# Patient Record
Sex: Female | Born: 1940 | Race: White | Hispanic: No | Marital: Married | Smoking: Never smoker
Health system: Southern US, Community
[De-identification: ages and names within clinical notes are randomized; demographics above are authoritative.]

## PROBLEM LIST (undated history)

## (undated) DIAGNOSIS — F329 Major depressive disorder, single episode, unspecified: Secondary | ICD-10-CM

## (undated) DIAGNOSIS — F32A Depression, unspecified: Secondary | ICD-10-CM

## (undated) DIAGNOSIS — E669 Obesity, unspecified: Secondary | ICD-10-CM

## (undated) DIAGNOSIS — N200 Calculus of kidney: Secondary | ICD-10-CM

## (undated) DIAGNOSIS — E785 Hyperlipidemia, unspecified: Secondary | ICD-10-CM

## (undated) DIAGNOSIS — K635 Polyp of colon: Secondary | ICD-10-CM

## (undated) DIAGNOSIS — E559 Vitamin D deficiency, unspecified: Secondary | ICD-10-CM

## (undated) DIAGNOSIS — F0390 Unspecified dementia without behavioral disturbance: Secondary | ICD-10-CM

## (undated) DIAGNOSIS — E213 Hyperparathyroidism, unspecified: Secondary | ICD-10-CM

## (undated) DIAGNOSIS — A6000 Herpesviral infection of urogenital system, unspecified: Secondary | ICD-10-CM

## (undated) DIAGNOSIS — R269 Unspecified abnormalities of gait and mobility: Secondary | ICD-10-CM

## (undated) DIAGNOSIS — I10 Essential (primary) hypertension: Secondary | ICD-10-CM

## (undated) DIAGNOSIS — R413 Other amnesia: Secondary | ICD-10-CM

## (undated) DIAGNOSIS — E119 Type 2 diabetes mellitus without complications: Secondary | ICD-10-CM

## (undated) DIAGNOSIS — K219 Gastro-esophageal reflux disease without esophagitis: Secondary | ICD-10-CM

## (undated) HISTORY — DX: Vitamin D deficiency, unspecified: E55.9

## (undated) HISTORY — DX: Hyperlipidemia, unspecified: E78.5

## (undated) HISTORY — DX: Type 2 diabetes mellitus without complications: E11.9

## (undated) HISTORY — DX: Depression, unspecified: F32.A

## (undated) HISTORY — PX: OTHER SURGICAL HISTORY: SHX169

## (undated) HISTORY — DX: Hyperparathyroidism, unspecified: E21.3

## (undated) HISTORY — DX: Gastro-esophageal reflux disease without esophagitis: K21.9

## (undated) HISTORY — DX: Polyp of colon: K63.5

## (undated) HISTORY — DX: Calculus of kidney: N20.0

## (undated) HISTORY — DX: Obesity, unspecified: E66.9

## (undated) HISTORY — DX: Unspecified abnormalities of gait and mobility: R26.9

## (undated) HISTORY — DX: Major depressive disorder, single episode, unspecified: F32.9

## (undated) HISTORY — DX: Other amnesia: R41.3

## (undated) HISTORY — PX: NISSEN FUNDOPLICATION: SHX2091

## (undated) HISTORY — PX: INTESTINAL MALROTATION REPAIR: SHX411

## (undated) HISTORY — DX: Essential (primary) hypertension: I10

## (undated) HISTORY — DX: Herpesviral infection of urogenital system, unspecified: A60.00

---

## 2000-01-20 ENCOUNTER — Encounter: Payer: Self-pay | Admitting: Gastroenterology

## 2000-01-20 ENCOUNTER — Encounter: Admission: RE | Admit: 2000-01-20 | Discharge: 2000-01-20 | Payer: Self-pay | Admitting: Gastroenterology

## 2000-02-03 ENCOUNTER — Encounter (INDEPENDENT_AMBULATORY_CARE_PROVIDER_SITE_OTHER): Payer: Self-pay

## 2000-02-03 ENCOUNTER — Ambulatory Visit (HOSPITAL_COMMUNITY): Admission: RE | Admit: 2000-02-03 | Discharge: 2000-02-03 | Payer: Self-pay | Admitting: Gastroenterology

## 2003-03-30 ENCOUNTER — Encounter: Payer: Self-pay | Admitting: Family Medicine

## 2003-03-30 ENCOUNTER — Encounter: Admission: RE | Admit: 2003-03-30 | Discharge: 2003-03-30 | Payer: Self-pay | Admitting: Family Medicine

## 2004-01-09 ENCOUNTER — Other Ambulatory Visit: Admission: RE | Admit: 2004-01-09 | Discharge: 2004-01-09 | Payer: Self-pay | Admitting: Family Medicine

## 2004-01-30 ENCOUNTER — Encounter: Admission: RE | Admit: 2004-01-30 | Discharge: 2004-01-30 | Payer: Self-pay | Admitting: Family Medicine

## 2007-04-12 ENCOUNTER — Other Ambulatory Visit: Admission: RE | Admit: 2007-04-12 | Discharge: 2007-04-12 | Payer: Self-pay | Admitting: Family Medicine

## 2007-04-28 ENCOUNTER — Encounter: Admission: RE | Admit: 2007-04-28 | Discharge: 2007-04-28 | Payer: Self-pay | Admitting: *Deleted

## 2008-04-02 ENCOUNTER — Ambulatory Visit (HOSPITAL_COMMUNITY): Admission: RE | Admit: 2008-04-02 | Discharge: 2008-04-02 | Payer: Self-pay | Admitting: Dermatology

## 2008-05-25 ENCOUNTER — Ambulatory Visit (HOSPITAL_COMMUNITY): Admission: RE | Admit: 2008-05-25 | Discharge: 2008-05-25 | Payer: Self-pay | Admitting: Family Medicine

## 2008-08-01 ENCOUNTER — Inpatient Hospital Stay (HOSPITAL_COMMUNITY): Admission: RE | Admit: 2008-08-01 | Discharge: 2008-08-04 | Payer: Self-pay | Admitting: Surgery

## 2009-02-16 ENCOUNTER — Emergency Department (HOSPITAL_COMMUNITY): Admission: EM | Admit: 2009-02-16 | Discharge: 2009-02-16 | Payer: Self-pay | Admitting: Emergency Medicine

## 2010-01-09 ENCOUNTER — Emergency Department (HOSPITAL_COMMUNITY): Admission: EM | Admit: 2010-01-09 | Discharge: 2010-01-09 | Payer: Self-pay | Admitting: Emergency Medicine

## 2010-01-13 ENCOUNTER — Ambulatory Visit (HOSPITAL_COMMUNITY): Admission: RE | Admit: 2010-01-13 | Discharge: 2010-01-13 | Payer: Self-pay | Admitting: Urology

## 2010-05-10 IMAGING — CR DG ABDOMEN 1V
1 series · 1 of 1 positions shown · non-contrast
Comparison: 01/09/2010

CLINICAL DATA: Left ureteral pelvic junction calculus, pre
lithotripsy.

ABDOMEN - 1 VIEW

[t abdomen supine]
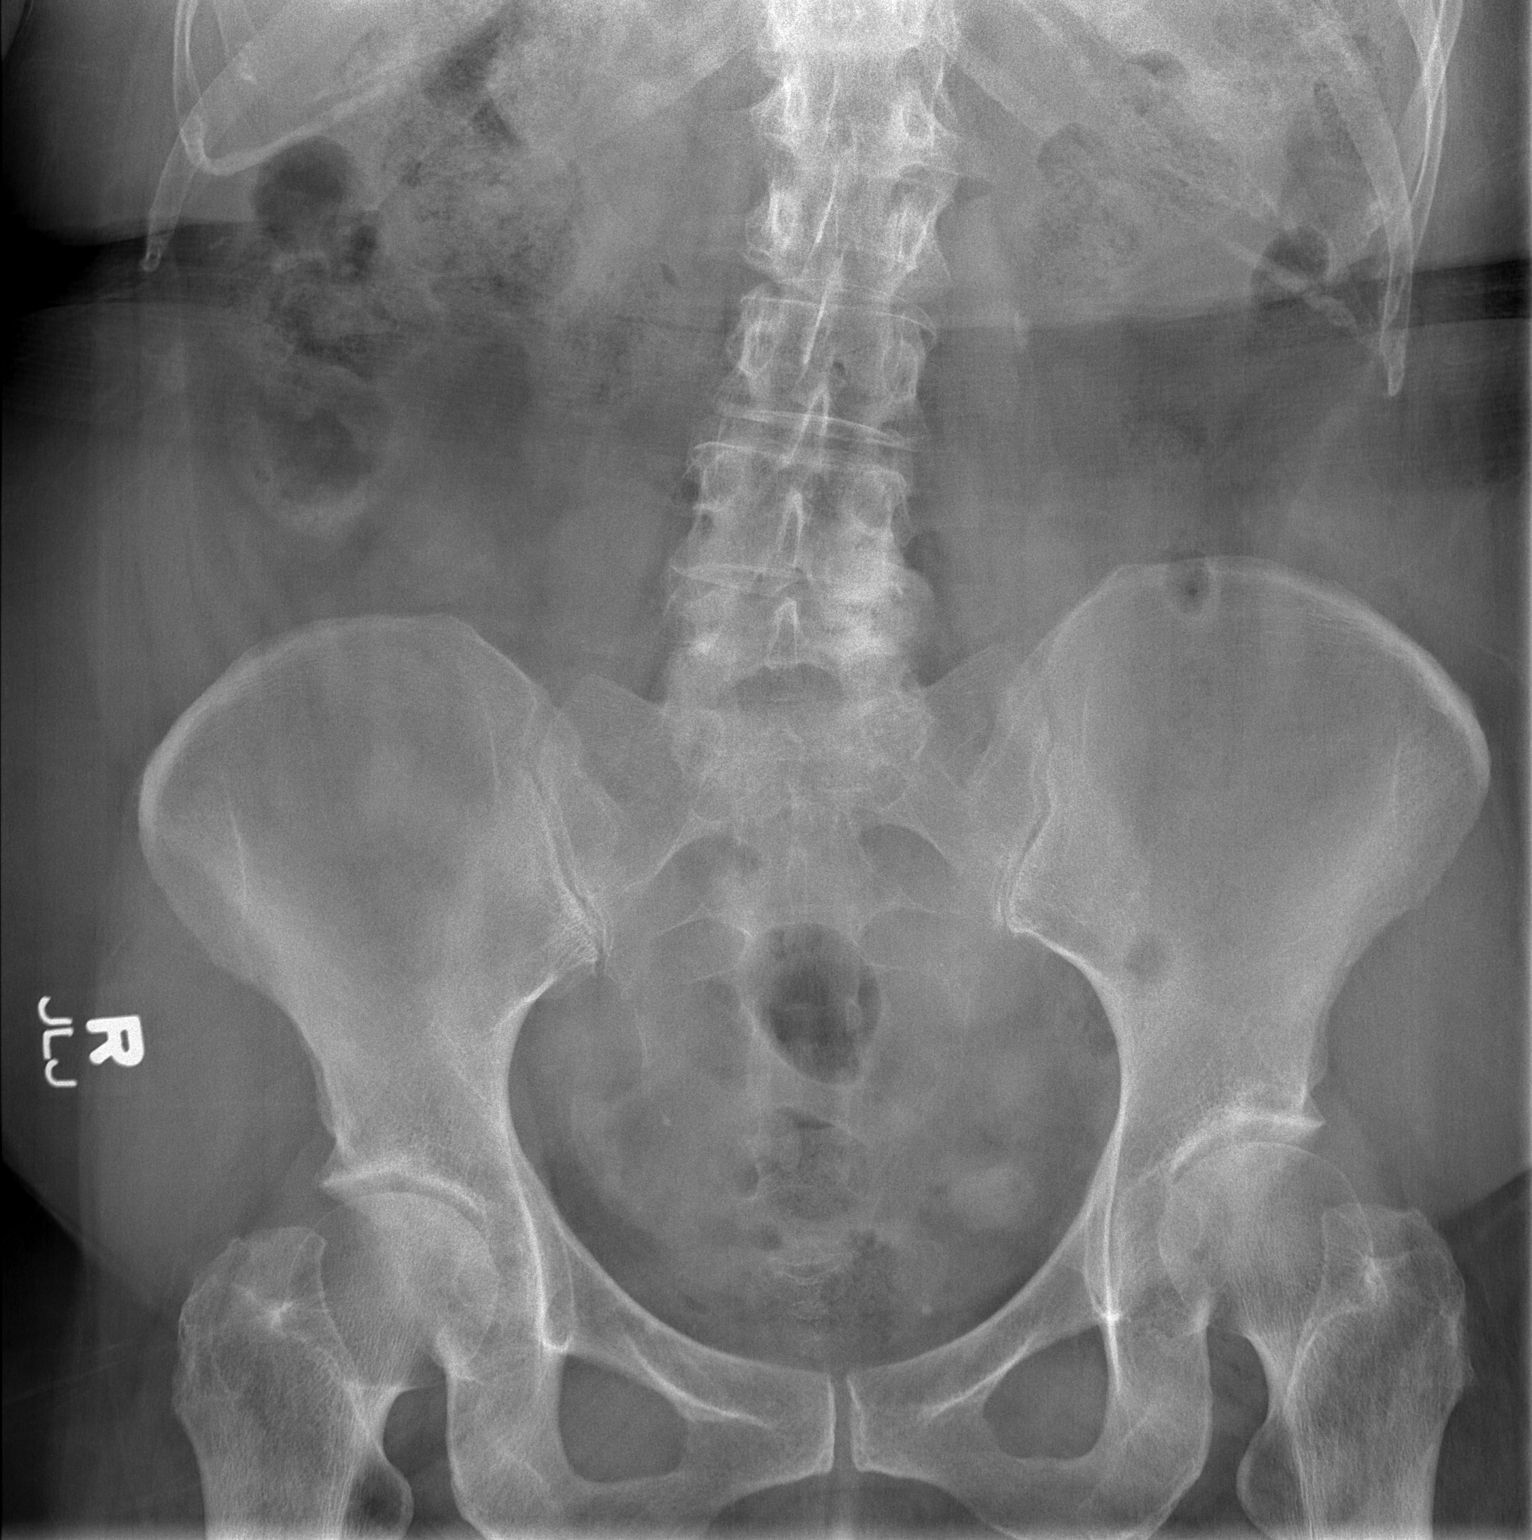

[1 of 1 positions shown; findings below may reference images not displayed]

FINDINGS: The left UPJ calculus projects over the left psoas margin
at the level of the L2-3 intervertebral disc space, and measures
1.2 x 0.5 cm.

Small bilateral pelvic oblique phleboliths are noted.
IMPRESSION: 1.  1.2 x 0.5 cm left ureteral pelvic junction calculus.

## 2010-11-02 ENCOUNTER — Encounter: Payer: Self-pay | Admitting: *Deleted

## 2011-01-04 LAB — POCT I-STAT, CHEM 8
Calcium, Ion: 1.28 mmol/L (ref 1.12–1.32)
Chloride: 105 mEq/L (ref 96–112)
Creatinine, Ser: 0.8 mg/dL (ref 0.4–1.2)
Sodium: 138 mEq/L (ref 135–145)
TCO2: 28 mmol/L (ref 0–100)

## 2011-01-04 LAB — URINALYSIS, ROUTINE W REFLEX MICROSCOPIC
Glucose, UA: NEGATIVE mg/dL
Specific Gravity, Urine: 1.02 (ref 1.005–1.030)
Urobilinogen, UA: 1 mg/dL (ref 0.0–1.0)
pH: 5.5 (ref 5.0–8.0)

## 2011-01-04 LAB — URINE MICROSCOPIC-ADD ON

## 2011-01-04 LAB — URINE CULTURE: Culture: NO GROWTH

## 2011-01-20 LAB — URINE CULTURE: Colony Count: 45000

## 2011-01-20 LAB — URINALYSIS, ROUTINE W REFLEX MICROSCOPIC
Bilirubin Urine: NEGATIVE
Glucose, UA: NEGATIVE mg/dL
Nitrite: NEGATIVE
Protein, ur: NEGATIVE mg/dL
Specific Gravity, Urine: 1.021 (ref 1.005–1.030)
Urobilinogen, UA: 1 mg/dL (ref 0.0–1.0)
pH: 6 (ref 5.0–8.0)

## 2011-01-20 LAB — URINE MICROSCOPIC-ADD ON

## 2011-02-24 NOTE — Op Note (Signed)
NAME:  KAMANI, MAGNUSSEN          ACCOUNT NO.:  [id number]   MEDICAL RECORD NO.:  [id number]          PATIENT TYPE:  INP   LOCATION:  1235                         FACILITY:  [facility]   PHYSICIAN:  Thornton Park. Daphine Deutscher, MD  DATE OF BIRTH:  01-15-1941   DATE OF PROCEDURE:  08/01/2008  DATE OF DISCHARGE:                               OPERATIVE REPORT   PREOP INDICATIONS:  Ms. Najma Bozarth is a 70 year old white female  with a type IV hiatal hernia as diagnosed by CT scan at [facility]  Radiology demonstrating most of the stomach and some of the transverse  colon was in the chest.  Informed consent was obtained regarding repair.   PROCEDURE:  Laparoscopic takedown of type IV hiatus hernia, upper  endoscopy by Dr. Colin Benton, repair of the hiatus with pledgeted sutures  posteriorly and anteriorly with an anterior patch of Cook biological  patch, reference [id number], Nissen fundoplication over a #50 lighted  bougie.   SURGEON:  Thornton Park. Daphine Deutscher, MD.   ASSISTANT:  Currie Paris, M.D.   OPERATION TIME:  2 hours 45 minutes.   ANESTHESIA:  General endotracheal.   DESCRIPTION OF PROCEDURE:  Ms. Nam was taken to room 1 on  Wednesday, August 01, 2008 and given general anesthesia.  The abdomen  was prepped thoroughly with Techni-Care and draped sterilely.  The  abdomen was entered through the left upper quadrant using a 0-degree  Optiview 10-mm technique without difficulty and once insufflation was  performed, standard trocar placements including two 11s on the right,  one slightly to the left of the upper mid umbilicus with a camera and a  5-mm in upper midline for the [facility] retractor.  Upon retracting the  liver, the transverse colon was seen to go up into the chest and this  was retrieved easily.  It was adhered to the left crus and this was  taken down with the Harmonic scalpel.  I then grasped the stomach and  was able to reduce it except for the sac that bound a portion  of into  the chest.   Dissection was begun on the left side along the greater curvature and I  went up into the chest, taking the sac down and allowing me to mobilize  the stomach.  On the right side I went along the right crus and used  that as my entry point into the sac and then went up into the chest and  retrieved the esophagus and was able to get the stomach at length.   Dr. Colin Benton came in and she endoscoped the patient while Dr. Jamey Ripa and I  stayed down and correlated the anatomy, and we appeared to have the  esophagus at an adequate length into the abdomen.  The hiatus was closed  x2 pledgeted sutures posteriorly and 1 anteriorly.  After closing this,  I used a Cook patch that I sutured into the diaphragm anteriorly with  horizontal mattress sutures using Surgidac and with tie knots.  I had  used tie knots also on the hiatus closure.  The patch was tacked  anteriorly in 2 places to the pledgeted material  and then down either  side posteriorly, and I actually cut a little rent in it to give it a  little more room for the esophagus.   The Nissen wrap was then performed grasping a very floppy part of the  stomach and easily it invaginated around the esophagus.  Prior to  removing the endoscope, I actually put it back down and re-endoscoped  the patient and checked our anatomy, and there was no evidence of any  leak or injury to the esophagus or the stomach.  Wrap was then performed  with 3 sutures using Surgidac and tie knots.  Three sutures tacking the  uppermost one, went through the stomach, esophagus, and also through a  portion of the Sloan mesh and then back to the other portion of the  wrapped stomach.  This was tied down and 2 more sutures were taken,  again, getting bites of stomach and esophageal tissue and again securing  all of the tie knots.  I put some Tisseel up beneath the wrap and helped  adhere the fundoplication, and everything looked good in order.  There  was  no bleeding noted.  Everything appeared to be in good shape.  I went  ahead and removed the trocars and the [facility] retractor.  The wounds  were closed with 4-0 Vicryl, benzoin and Steri-Strips.  The patient  tolerated the procedure well and was taken to the recovery room in  satisfactory condition.  Picture of the step-down unit postoperatively.      Thornton Park Daphine Deutscher, MD  Electronically Signed     MBM/MEDQ  D:  08/01/2008  T:  08/01/2008  Job:  [id number]   cc:   Juluis Rainier, M.D.  Fax: [phone]   Stephani Police, MD

## 2011-02-27 NOTE — Discharge Summary (Signed)
NAME:  [name], SHEETS          ACCOUNT NO.:  [id number]   MEDICAL RECORD NO.:  [id number]          PATIENT TYPE:  INP   LOCATION:  1523                         [facility]ACILITY:  [facility]   PHYSICIAN:  Thornton Park. Daphine Deutscher, MD  DATE OF BIRTH:  October 27, 1940   DATE OF ADMISSION:  08/01/2008  DATE OF DISCHARGE:  08/04/2008                               DISCHARGE SUMMARY   ADMITTING DIAGNOSIS:  Type 4 hiatal hernia containing stomach.   PROCEDURE:  Laparoscopic repair of hiatus with upper endoscopy and  Nissen fundoplication.   COURSE IN THE HOSPITAL:  Christine Webster is a 70 year old lady with  type 4 hiatal hernia.  This was repaired laparoscopically along with a  Nissen fundoplication.  She did well and had a good swallow that showed  no leak and she was ready for discharge on postop day #3.  Her  hemoglobin was stable and she was tolerating her pain well, and was  instructed to return to the office in 3 to 4 weeks.      Thornton Park Daphine Deutscher, MD  Electronically Signed     MBM/MEDQ  D:  09/20/2008  T:  09/20/2008  Job:  [id number]

## 2011-07-10 LAB — BLOOD GAS, ARTERIAL
Bicarbonate: 23.8
FIO2: 0.21
O2 Saturation: 95.7
pH, Arterial: 7.433 — ABNORMAL HIGH
pO2, Arterial: 76.2 — ABNORMAL LOW

## 2011-07-13 LAB — DIFFERENTIAL
Basophils Absolute: 0
Basophils Relative: 0
Eosinophils Relative: 0
Lymphocytes Relative: 4 — ABNORMAL LOW
Monocytes Relative: 3
Neutro Abs: 13.9 — ABNORMAL HIGH
Neutrophils Relative %: 93 — ABNORMAL HIGH

## 2011-07-13 LAB — CBC
HCT: 41
Hemoglobin: 13.6
MCHC: 33.1
MCV: 83.9
Platelets: 263
RBC: 4.83
RBC: 4.89
RDW: 13.6
WBC: 14.9 — ABNORMAL HIGH
WBC: 8.2

## 2012-02-08 ENCOUNTER — Other Ambulatory Visit: Payer: Self-pay | Admitting: Family Medicine

## 2012-02-08 ENCOUNTER — Other Ambulatory Visit (HOSPITAL_COMMUNITY)
Admission: RE | Admit: 2012-02-08 | Discharge: 2012-02-08 | Disposition: A | Discharge status: Home / Self Care | Payer: Medicare Other | Source: Ambulatory Visit | Attending: Family Medicine | Admitting: Family Medicine

## 2012-02-08 DIAGNOSIS — Z124 Encounter for screening for malignant neoplasm of cervix: Secondary | ICD-10-CM | POA: Insufficient documentation

## 2012-02-11 ENCOUNTER — Other Ambulatory Visit: Payer: Self-pay | Admitting: Family Medicine

## 2012-02-11 DIAGNOSIS — Z1231 Encounter for screening mammogram for malignant neoplasm of breast: Secondary | ICD-10-CM

## 2012-02-17 ENCOUNTER — Ambulatory Visit
Admission: RE | Admit: 2012-02-17 | Discharge: 2012-02-17 | Disposition: A | Discharge status: Home / Self Care | Payer: Medicare Other | Source: Ambulatory Visit | Attending: Family Medicine | Admitting: Family Medicine

## 2012-02-17 DIAGNOSIS — Z1231 Encounter for screening mammogram for malignant neoplasm of breast: Secondary | ICD-10-CM

## 2013-04-07 ENCOUNTER — Other Ambulatory Visit: Payer: Self-pay

## 2013-04-07 DIAGNOSIS — Z1231 Encounter for screening mammogram for malignant neoplasm of breast: Secondary | ICD-10-CM

## 2013-05-03 ENCOUNTER — Ambulatory Visit
Admission: RE | Admit: 2013-05-03 | Discharge: 2013-05-03 | Disposition: A | Discharge status: Home / Self Care | Payer: Medicare Other | Source: Ambulatory Visit

## 2013-05-03 DIAGNOSIS — Z1231 Encounter for screening mammogram for malignant neoplasm of breast: Secondary | ICD-10-CM

## 2013-06-08 ENCOUNTER — Encounter: Payer: Medicare Other | Attending: Family Medicine

## 2013-06-08 VITALS — Ht 66.0 in | Wt 189.3 lb

## 2013-06-08 DIAGNOSIS — Z713 Dietary counseling and surveillance: Secondary | ICD-10-CM | POA: Insufficient documentation

## 2013-06-08 DIAGNOSIS — E119 Type 2 diabetes mellitus without complications: Secondary | ICD-10-CM | POA: Insufficient documentation

## 2013-06-08 NOTE — Progress Notes (Signed)
Patient was seen on 06/08/13 for the first of a series of three diabetes self-management courses at the [facility].   Current HbA1c: 6.6%  The following learning objectives were met by the patient during this course:   Defines the role of glucose and insulin  Identifies type of diabetes and pathophysiology  Defines the diagnostic criteria for diabetes and prediabetes  States the risk factors for Type 2 Diabetes  States the symptoms of Type 2 Diabetes  Defines Type 2 Diabetes treatment goals  Defines Type 2 Diabetes treatment options  States the rationale for glucose monitoring  Identifies A1C, glucose targets, and testing times  Identifies proper sharps disposal  Defines the purpose of a diabetes food plan  Identifies carbohydrate food groups  Defines effects of carbohydrate foods on glucose levels  Identifies carbohydrate choices/grams/food labels  States benefits of physical activity and effect on glucose  Review of suggested activity guidelines  Handouts given during class include:  Type 2 Diabetes: Basics Book  My Food Plan Book  Food and Activity Log  Your patient has identified their diabetes self-care support plan as:  [facility] support group  Follow-Up Plan: Attend core 2 and core 3

## 2013-06-08 NOTE — Patient Instructions (Signed)
Goals:  Follow Diabetes Meal Plan as instructed  Eat 3 meals and 2 snacks, every 3-5 hrs  Limit carbohydrate intake to 30-45 grams carbohydrate/meal  Limit carbohydrate intake to 15 grams carbohydrate/snack  Add lean protein foods to meals/snacks  Monitor glucose levels as instructed by your doctor  Aim for 30 mins of physical activity daily  Bring food record and glucose log to your next nutrition visit 

## 2013-06-26 ENCOUNTER — Other Ambulatory Visit: Payer: Self-pay | Admitting: Dermatology

## 2013-06-29 ENCOUNTER — Encounter: Payer: Medicare Other | Attending: Family Medicine

## 2013-06-29 DIAGNOSIS — Z713 Dietary counseling and surveillance: Secondary | ICD-10-CM | POA: Insufficient documentation

## 2013-06-29 DIAGNOSIS — E119 Type 2 diabetes mellitus without complications: Secondary | ICD-10-CM | POA: Insufficient documentation

## 2013-06-29 NOTE — Progress Notes (Signed)
Patient was seen on 06/29/13 for the second of a series of three diabetes self-management courses at the [facility]. The following learning objectives were met by the patient during this course:   Explain basic nutrition maintenance and quality assurance  Describe causes, symptoms and treatment of hypoglycemia and hyperglycemia  Explain how to manage diabetes during illness  Describe the importance of good nutrition for health and healthy eating strategies  List strategies to follow meal plan when dining out  Describe the effects of alcohol on glucose and how to use it safely  Describe problem solving skills for day-to-day glucose challenges  Describe strategies to use when treatment plan needs to change  Identify important factors involved in successful weight loss  Describe ways to remain physically active  Describe the impact of regular activity on insulin resistance  Identify current diabetes medications, their action on blood glucose, and [pssible side effects.  Handouts given in class:  Refrigerator magnet for Sick Day Guidelines  [facility] Oral medication/insulin handout  Your patient has identified their diabetes self-care support plan as:  [facility] support group   Follow-Up Plan: Patient will attend the final class of the ADA Diabetes Self-Care Education.   

## 2013-07-19 ENCOUNTER — Encounter: Payer: Medicare Other | Attending: Family Medicine

## 2013-07-19 DIAGNOSIS — Z713 Dietary counseling and surveillance: Secondary | ICD-10-CM | POA: Insufficient documentation

## 2013-07-19 DIAGNOSIS — E119 Type 2 diabetes mellitus without complications: Secondary | ICD-10-CM | POA: Insufficient documentation

## 2013-07-19 NOTE — Progress Notes (Signed)
Patient was seen on 07/19/13 for the third of a series of three diabetes self-management courses at the [facility]. The following learning objectives were met by the patient during this course:    Describe how diabetes changes over time   Identify diabetes complications and ways to prevent them   Describe strategies that can promote heart health including lowering blood pressure and cholesterol   Describe strategies to lower dietary fat and sodium in the diet   Identify physical activities that benefit cardiovascular health   Describe role of stress on blood glucose and develop strategies to address psychosocial issues   Evaluate success in meeting personal goal   Describe the belief that they can live successfully with diabetes day to day   Establish 2-3 goals that they will plan to diligently work on until they return for the free 87-month follow-up visit  The following handouts were given in class:  Goal setting handout  Class evaluation form  Low-sodium seasoning tips  Stress management handout  Your patient has established the following 4 month goals for diabetes self-care:  Be active 20 minutes or more 5 days a week  Test glucose qd  Your patient has identified these potential barriers to change:  Drinking soda  Your patient has identified their diabetes self-care support plan as:  [facility] support group  husband   Follow-Up Plan: Patient was offered a 4 month follow-up visit for diabetes self-management education.

## 2013-11-21 ENCOUNTER — Encounter: Payer: Self-pay | Admitting: *Deleted

## 2013-11-21 ENCOUNTER — Encounter: Payer: Medicare Other | Attending: Family Medicine | Admitting: *Deleted

## 2013-11-21 DIAGNOSIS — E119 Type 2 diabetes mellitus without complications: Secondary | ICD-10-CM | POA: Insufficient documentation

## 2013-11-21 NOTE — Progress Notes (Signed)
  Patient was seen on 11/21/13 for their 3 month follow-up as a part of the diabetes self-management courses at the [facility]. The following learning objectives were met by your patient during this course:  Patient self reports the following: Current A1c: STATES HER MD DIDN'T TELL HER THE RESULTS OR SHE CAN'T REMEMBER IF SHE DID  Diabetes control has improved since diabetes self-management training: UNKNOWN Number of days blood glucose is >200: NOT TESTING  Last MD appointment for diabetes: November Changes in treatment plan: NONE Confidence with ability to manage diabetes: NO, SHE IS DISTRACTED WITH HUSBAND'S ILLNESS  Areas for improvement with diabetes self-care: NEEDS TO LEARN HOW TO USE HER NEW METER AND TO CHECK BG TWICE A WEEK Willingness to participate in diabetes support group: NOT AT THIS TIME  Your patient has established the following 4 month goals for diabetes self-care:  Be active 20 minutes or more 5 days a week HASN'T BEEN ABLE TO WITH SICK HUSBAND Test glucose qd - HAS A METER BUT NOT USING IT RIGHT NOW Your patient has identified these potential barriers to change:  Drinking soda - SHE STOPPED FOR SEVERAL WEEKS BUT HAS STARTED BACK  Your patient has identified their diabetes self-care support plan as:  [facility] support group  husband  Please see Diabetes Flow sheet for findings related to patient's self-care.  Follow-Up Plan: PATIENT TO COME IN NEXT WEEK TO BE INSTRUCTED ON HER METER SO SHE CAN START TESTING

## 2013-11-28 ENCOUNTER — Ambulatory Visit: Payer: Medicare Other | Admitting: *Deleted

## 2013-12-01 ENCOUNTER — Encounter: Payer: Medicare Other | Admitting: *Deleted

## 2013-12-01 DIAGNOSIS — E119 Type 2 diabetes mellitus without complications: Secondary | ICD-10-CM

## 2013-12-11 NOTE — Progress Notes (Signed)
Blood Glucose Monitoring Instruction  Appointment Start Time: 1430 Appointment End Time: 1500  Assessment:  Primary concerns today: Patient here for instruction on Blood Glucose Monitoring. They do have their own meter at this time.  Meter Provided:   No    Medications: see list     Intervention:    Explained rationale of testing BG to obtain data as to how their diabetes is being managed.  Provided Target Ranges for both pre and post meals  Explained factors that effect BG including food (carbohydrate), stress, activity level and insulin availability in the body including diabetes medications  Taught patient techniques for using BG monitor and lancing device  Discussed need for Rx for strips and lancets   Explained rationale of recording BG both for patient and MD to assess patterns as needed.  Follow Up: Patient offered follow up as needed.

## 2014-11-21 ENCOUNTER — Ambulatory Visit: Payer: Medicare Other | Attending: Family Medicine | Admitting: Physical Therapy

## 2014-11-21 DIAGNOSIS — R2689 Other abnormalities of gait and mobility: Secondary | ICD-10-CM | POA: Diagnosis not present

## 2014-11-21 DIAGNOSIS — R2681 Unsteadiness on feet: Secondary | ICD-10-CM | POA: Diagnosis not present

## 2014-11-26 ENCOUNTER — Ambulatory Visit: Payer: Medicare Other | Admitting: Physical Therapy

## 2014-11-28 ENCOUNTER — Ambulatory Visit: Payer: Medicare Other | Admitting: Physical Therapy

## 2014-11-28 ENCOUNTER — Encounter: Payer: Self-pay | Admitting: Physical Therapy

## 2014-11-28 DIAGNOSIS — Z9181 History of falling: Secondary | ICD-10-CM

## 2014-11-28 DIAGNOSIS — R2681 Unsteadiness on feet: Secondary | ICD-10-CM | POA: Diagnosis not present

## 2014-11-28 DIAGNOSIS — R2689 Other abnormalities of gait and mobility: Secondary | ICD-10-CM

## 2014-11-28 NOTE — Therapy (Signed)
[facility] [address], [address] [place], [place], [address] Phone: [phone]   Fax:  [phone]  Physical Therapy Treatment  Patient Details  Name: Christine PaxStella M Arriola MRN: [id number] Date of Birth: 1940/11/26 Referring Provider:  Marthe PatchBarnes, Elizabeth Stewa*  Encounter Date: 11/28/2014      PT End of Session - 11/28/14 1612    Visit Number 2   Number of Visits 16   PT Start Time 1525   PT Stop Time 1615   PT Time Calculation (min) 50 min   Activity Tolerance Patient limited by fatigue   Behavior During Therapy [facility] for tasks assessed/performed      Past Medical History  Diagnosis Date  . Depression   . Diabetes mellitus without complication   . GERD (gastroesophageal reflux disease)   . Obesity   . Hypertension   . Hyperlipidemia   . Kidney stones   . Vitamin D deficiency     Past Surgical History  Procedure Laterality Date  . Intestinal malrotation repair      There were no vitals taken for this visit.  Visit Diagnosis:  Falls infrequently  Abnormality of gait due to impairment of balance      Subjective Assessment - 11/28/14 1530    Symptoms no pain   Currently in Pain? No/denies   Multiple Pain Sites No          [facility] PT Assessment - 11/28/14 0001    Assessment   Medical Diagnosis Gait instability   Precautions   Precautions None   Balance Screen   Has the patient fallen in the past 6 months No   Has the patient had a decrease in activity level because of a fear of falling?  Yes   Is the patient reluctant to leave their home because of a fear of falling?  No                  [facility] Adult PT Treatment/Exercise - 11/28/14 0001    Ambulation/Gait   Ambulation/Gait Yes   Ambulation Distance (Feet) 180 Feet   Ambulation Surface Level   Gait Comments working on corners, not scissoring and keeping better space between feet.   High Level Balance   High Level Balance Activities Turns   180 degrees 10x, vc to keep spacing between feet and pick fe   Exercises   Exercises Knee/Hip   Knee/Hip Exercises: Aerobic   Stationary Bike L1 6 min   Knee/Hip Exercises: Standing   Heel Raises 2 sets;10 reps   Hip ADduction Both;2 sets;10 reps   Hip ADduction Limitations --  VC to slow speed   Other Standing Knee Exercises Hip extension, pt performed toe taps behind her with better activation of hip extensors. 10x2 Bil   Other Standing Knee Exercises Hip flexion 2x10 bil   Knee/Hip Exercises: Seated   Other Seated Knee Exercises Seated ball squeeze  2x10                PT Education - 11/28/14 1547    Education provided Yes   Education Details fall prevention, HEP added heel raises to HEP   Person(s) Educated Patient;Spouse   Methods Handout   Comprehension Verbalized understanding          PT Short Term Goals - 11/28/14 1616    PT SHORT TERM GOAL #1   Title Be independent in initial HEP   Time 4   Period Weeks   Status On-going  Only first treatment  PT SHORT TERM GOAL #2   Title DGI score is >18/24 points   Time 4   Period Weeks   Status On-going  Working on components in therapy   PT SHORT TERM GOAL #3   Title Feeling of being off balanced after getting out of bed decreased >/=25%   Time 4   Period Weeks   Status On-going  Only first treatment           PT Long Term Goals - 11/28/14 1619    PT LONG TERM GOAL #1   Title Demonstrate understanding of fall prevention techniques   Time 8   Period Weeks   Status Achieved   PT LONG TERM GOAL #2   Title Independent in adavnced HEP for balance training   Time 8   Period Weeks   Status On-going  Establishing initial HEP at this point.   PT LONG TERM GOAL #3   Title DGI score is 22/24 points   Time 8   Period Weeks   Status On-going  Working towards   PT LONG TERM GOAL #4   Title Step around objects without stumbling   Time 8   Period Weeks   Status On-going  Not addressed yet   PT  LONG TERM GOAL #5   Title Stop and turn quickly without stumbling   Time 8   Period Weeks   Status On-going  Working towards in [employer] session   Additional Long Term Goals   Additional Long Term Goals Yes   PT LONG TERM GOAL #6   Title Reports no falls since therapy started due to increased balance   Time 8   Period Weeks   Status On-going  No new falls   PT LONG TERM GOAL #7   Title Feeling of being off balanced after getting out of bed decreased >/=75%   Time 8   Period Weeks   Status On-going  First week of treatment               Plan - 11/28/14 1636    PT Frequency 2x / week   PT Duration 8 weeks        Problem List There are no active problems to display for this patient.   [facility] 11/28/2014, 4:37 PM  [facility] [address] [place], [address] [place], [place], [address] Phone: [phone]   Fax:  [phone]

## 2014-11-28 NOTE — Patient Instructions (Addendum)
Fall Prevention and Home Safety Falls cause injuries and can affect all age groups. It is possible to use preventive measures to significantly decrease the likelihood of falls. There are many simple measures which can make your home safer and prevent falls. OUTDOORS  Repair cracks and edges of walkways and driveways.  Remove high doorway thresholds.  Trim shrubbery on the main path into your home.  Have good outside lighting.  Clear walkways of tools, rocks, debris, and clutter.  Check that handrails are not broken and are securely fastened. Both sides of steps should have handrails.  Have leaves, snow, and ice cleared regularly.  Use sand or salt on walkways during winter months.  In the garage, clean up grease or oil spills. BATHROOM  Install night lights.  Install grab bars by the toilet and in the tub and shower.  Use non-skid mats or decals in the tub or shower.  Place a plastic non-slip stool in the shower to sit on, if needed.  Keep floors dry and clean up all water on the floor immediately.  Remove soap buildup in the tub or shower on a regular basis.  Secure bath mats with non-slip, double-sided rug tape.  Remove throw rugs and tripping hazards from the floors. BEDROOMS  Install night lights.  Make sure a bedside light is easy to reach.  Do not use oversized bedding.  Keep a telephone by your bedside.  Have a firm chair with side arms to use for getting dressed.  Remove throw rugs and tripping hazards from the floor. KITCHEN  Keep handles on pots and pans turned toward the center of the stove. Use back burners when possible.  Clean up spills quickly and allow time for drying.  Avoid walking on wet floors.  Avoid hot utensils and knives.  Position shelves so they are not too high or low.  Place commonly used objects within easy reach.  If necessary, use a sturdy step stool with a grab bar when reaching.  Keep electrical cables out of the  way.  Do not use floor polish or wax that makes floors slippery. If you must use wax, use non-skid floor wax.  Remove throw rugs and tripping hazards from the floor. STAIRWAYS  Never leave objects on stairs.  Place handrails on both sides of stairways and use them. Fix any loose handrails. Make sure handrails on both sides of the stairways are as long as the stairs.  Check carpeting to make sure it is firmly attached along stairs. Make repairs to worn or loose carpet promptly.  Avoid placing throw rugs at the top or bottom of stairways, or properly secure the rug with carpet tape to prevent slippage. Get rid of throw rugs, if possible.  Have an electrician put in a light switch at the top and bottom of the stairs. OTHER FALL PREVENTION TIPS  Wear low-heel or rubber-soled shoes that are supportive and fit well. Wear closed toe shoes.  When using a stepladder, make sure it is fully opened and both spreaders are firmly locked. Do not climb a closed stepladder.  Add color or contrast paint or tape to grab bars and handrails in your home. Place contrasting color strips on first and last steps.  Learn and use mobility aids as needed. Install an electrical emergency response system.  Turn on lights to avoid dark areas. Replace light bulbs that burn out immediately. Get light switches that glow.  Arrange furniture to create clear pathways. Keep furniture in the same place.    Firmly attach carpet with non-skid or double-sided tape.  Eliminate uneven floor surfaces.  Select a carpet pattern that does not visually hide the edge of steps.  Be aware of all pets. OTHER HOME SAFETY TIPS  Set the water temperature for 120 F (48.8 C).  Keep emergency numbers on or near the telephone.  Keep smoke detectors on every level of the home and near sleeping areas. Document Released: 09/18/2002 Document Revised: 03/29/2012 Document Reviewed: 12/18/2011 [facility] Patient Information 2015  [place]. This information is not intended to replace advice given to you by your health care provider. Make sure you discuss any questions you have with your health care provider. PLANTARFLEXION STRENGTHENING:   Balancing ActANKLE: Plantarflexion, Bilateral - Standing   Stand with upright posture. Raise heels up as high as possible. ___ reps per set, ___ sets per day, ___ days per week Hold onto a support.  Heel Raise: Bilateral (Standing)   Rise on balls of feet. Repeat ____ times per set. Do ____ sets per session. Do ____ sessions per day.  [email or web address]   Heel Raise: Standing   Toes on board, heels on floor, knees slightly bent, rise up on toes as high as possible. Do ____ sets. Complete ____ repetitions.  [email or web address].[email or web address]    Heel Raise: Unilateral (Standing)   Balance on left foot, then rise on ball of foot. Repeat ____ times per set. Do ____ sets per session. Do ____ sessions per day.  [email or web address]   FUNCTIONAL MOBILITY: Toe Walking   Walk forward on toes. ___ reps per set, ___ sets per day, ___ days per week Use assistive device.   DORSIFLEXION STRENGTHENING:  Toe Raise (Standing)   Rock back on heels. Repeat ____ times per set. Do ____ sets per session. Do ____ sessions per day.  [email or web address]://[email or web address]   FUNCTIONAL MOBILITY: Heel Walking   Walk forward on heels. ___ reps per set, ___ sets per day, ___ days per week Use assistive device.   SINGLE LIMB STANCE   Stance: single leg on floor. Raise leg. Hold ___ seconds. Repeat with other leg. ___ reps per set, ___ sets per day, ___ days per week  Copyright  VHI. All rights reserved.

## 2014-11-28 NOTE — Therapy (Signed)
[facility] [address] [place], [address] [place], [place], [address] Phone: [phone]   Fax:  [phone]  Physical Therapy Treatment  Patient Details  Name: Christine Webster MRN: [id number] Date of Birth: 1941-03-29 Referring Provider:  Marthe Patch*  Encounter Date: 11/28/2014      PT End of Session - 11/28/14 1612    Visit Number 2   Number of Visits 16   PT Start Time 1525   PT Stop Time 1615   PT Time Calculation (min) 50 min   Activity Tolerance Patient limited by fatigue   Behavior During Therapy [facility] for tasks assessed/performed      Past Medical History  Diagnosis Date  . Depression   . Diabetes mellitus without complication   . GERD (gastroesophageal reflux disease)   . Obesity   . Hypertension   . Hyperlipidemia   . Kidney stones   . Vitamin D deficiency     Past Surgical History  Procedure Laterality Date  . Intestinal malrotation repair      There were no vitals taken for this visit.  Visit Diagnosis:  Falls infrequently  Abnormality of gait due to impairment of balance      Subjective Assessment - 11/28/14 1530    Symptoms no pain   Currently in Pain? No/denies   Multiple Pain Sites No                    [facility] Adult PT Treatment/Exercise - 11/28/14 0001    Ambulation/Gait   Ambulation/Gait Yes   Ambulation Distance (Feet) 180 Feet   Ambulation Surface Level   Gait Comments working on corners, not scissoring and keeping better space between feet.   High Level Balance   High Level Balance Activities Turns  180 degrees 10x, vc to keep spacing between feet and pick fe   Exercises   Exercises Knee/Hip   Knee/Hip Exercises: Aerobic   Stationary Bike L1 6 min   Knee/Hip Exercises: Standing   Heel Raises 2 sets;10 reps   Hip ADduction Both;2 sets;10 reps   Hip ADduction Limitations --  VC to slow speed   Other Standing Knee Exercises Hip extension, pt performed toe  taps behind her with better activation of hip extensors. 10x2 Bil   Other Standing Knee Exercises Hip flexion 2x10 bil   Knee/Hip Exercises: Seated   Other Seated Knee Exercises Seated ball squeeze  2x10                PT Education - 11/28/14 1547    Education provided Yes   Education Details fall prevention, HEP added heel raises to HEP   Person(s) Educated Patient;Spouse   Methods Handout   Comprehension Verbalized understanding          PT Short Term Goals - 11/28/14 1616    PT SHORT TERM GOAL #1   Title Be independent in initial HEP   Time 4   Period Weeks   Status On-going  Only first treatment   PT SHORT TERM GOAL #2   Title DGI score is >18/24 points   Time 4   Period Weeks   Status On-going  Working on components in therapy   PT SHORT TERM GOAL #3   Title Feeling of being off balanced after getting out of bed decreased >/=25%   Time 4   Period Weeks   Status On-going  Only first treatment           PT Long  Term Goals - 11/28/14 1619    PT LONG TERM GOAL #1   Title Demonstrate understanding of fall prevention techniques   Time 8   Period Weeks   Status Achieved   PT LONG TERM GOAL #2   Title Independent in adavnced HEP for balance training   Time 8   Period Weeks   Status On-going  Establishing initial HEP at this point.   PT LONG TERM GOAL #3   Title DGI score is 22/24 points   Time 8   Period Weeks   Status On-going  Working towards   PT LONG TERM GOAL #4   Title Step around objects without stumbling   Time 8   Period Weeks   Status On-going  Not addressed yet   PT LONG TERM GOAL #5   Title Stop and turn quickly without stumbling   Time 8   Period Weeks   Status On-going  Working towards in [employer] session   Additional Long Term Goals   Additional Long Term Goals Yes   PT LONG TERM GOAL #6   Title Reports no falls since therapy started due to increased balance   Time 8   Period Weeks   Status On-going  No new falls   PT LONG  TERM GOAL #7   Title Feeling of being off balanced after getting out of bed decreased >/=75%   Time 8   Period Weeks   Status On-going  First week of treatment               Plan - 11/28/14 1613    Clinical Impression Statement Patient semi-compliant with HEP per her report. Showed LE fatigue as treatment progressed.   PT Next Visit Plan Do sit to stand, stepping around objects, and continue with hip/ankle strength   Consulted and Agree with Plan of Care Patient;Family member/caregiver   Family Member Consulted Spouse        Problem List There are no active problems to display for this patient.   Sukari Grist ,PTA  11/28/2014, 4:32 PM  [facility] [address], [place] [address] [place], [place], [address] Phone: [phone]   Fax:  [phone]

## 2014-12-05 ENCOUNTER — Ambulatory Visit: Payer: Medicare Other | Admitting: Physical Therapy

## 2014-12-10 ENCOUNTER — Encounter: Payer: Medicare Other | Admitting: Physical Therapy

## 2014-12-12 ENCOUNTER — Ambulatory Visit: Payer: Medicare Other | Attending: Family Medicine | Admitting: Physical Therapy

## 2014-12-12 ENCOUNTER — Encounter: Payer: Self-pay | Admitting: Physical Therapy

## 2014-12-12 DIAGNOSIS — R2689 Other abnormalities of gait and mobility: Secondary | ICD-10-CM | POA: Diagnosis not present

## 2014-12-12 DIAGNOSIS — R2681 Unsteadiness on feet: Secondary | ICD-10-CM | POA: Insufficient documentation

## 2014-12-12 DIAGNOSIS — Z9181 History of falling: Secondary | ICD-10-CM

## 2014-12-12 NOTE — Therapy (Signed)
[facility] [address] [place], [place], [address] Phone: [phone]   Fax:  [phone]  Physical Therapy Treatment  Patient Details  Name: Christine Webster MRN: [id number] Date of Birth: [birth year] Referring Provider:  Marthe Patch*  Encounter Date: 12/12/2014      PT End of Session - 12/12/14 1523    Visit Number 3   Number of Visits 16   PT Start Time 1440   PT Stop Time 1521   PT Time Calculation (min) 41 min   Activity Tolerance Patient limited by fatigue      Past Medical History  Diagnosis Date  . Depression   . Diabetes mellitus without complication   . GERD (gastroesophageal reflux disease)   . Obesity   . Hypertension   . Hyperlipidemia   . Kidney stones   . Vitamin D deficiency     Past Surgical History  Procedure Laterality Date  . Intestinal malrotation repair      There were no vitals taken for this visit.  Visit Diagnosis:  Falls infrequently  Abnormality of gait due to impairment of balance      Subjective Assessment - 12/12/14 1443    Symptoms Husband reports wife is not doing her exercises at home. No falls since last visit.    Currently in Pain? No/denies   Multiple Pain Sites No                    [facility] Adult PT Treatment/Exercise - 12/12/14 0001    High Level Balance   High Level Balance Activities Turns;Figure 8 turns;Negotitating around obstacles;Negotiating over obstacles   High Level Balance Comments CGA for frequent scissoring    Knee/Hip Exercises: Aerobic   Stationary Bike L1 8 min review of status during   Knee/Hip Exercises: Standing   Heel Raises 2 sets;10 reps   Hip ADduction Both;2 sets;10 reps   Hip ADduction Limitations --  VC to slow speed   Rebounder 3 way wt shifting 1 min each   VC for control   Other Standing Knee Exercises Hip extension, pt performed toe taps behind her with better activation of hip extensors. 10x2  Bil   Other Standing Knee Exercises Hip flexion 2x10 bil   Knee/Hip Exercises: Seated   Long Arc Quad Strengthening;Both;2 sets;10 reps   Other Seated Knee Exercises Seated ball squeeze  2x10   Other Seated Knee Exercises Red band abduction 2x10                  PT Short Term Goals - 12/12/14 1526    PT SHORT TERM GOAL #1   Title Be independent in initial HEP   Time 4   Period Weeks   Status On-going  Not doing   PT SHORT TERM GOAL #2   Title DGI score is >18/24 points   Time 4   Period Weeks   Status On-going   PT SHORT TERM GOAL #3   Title Feeling of being off balanced after getting out of bed decreased >/=25%   Time 4   Period Weeks   Status On-going  Will do next week.           PT Long Term Goals - 12/12/14 1528    PT LONG TERM GOAL #1   Title Demonstrate understanding of fall prevention techniques   Time 8   Period Weeks   Status Achieved   PT LONG TERM GOAL #2   Title  Independent in adavnced HEP for balance training   Time 8   Period Weeks   Status On-going   PT LONG TERM GOAL #3   Title DGI score is 22/24 points   Time 8   Period Weeks   Status On-going   PT LONG TERM GOAL #4   Title Step around objects without stumbling   Time 8   Period Weeks   Status On-going               Plan - 12/12/14 1524    Clinical Impression Statement Patient was encouraged to do her HEP. SHe completed all activities asked of her but had a lot of difficulty not scissoring with figure 8/ stepping over objects.   PT Frequency 2x / week   PT Duration 8 weeks   PT Next Visit Plan Continue with LE strength, do TUG and DGI    Consulted and Agree with Plan of Care Patient;Family member/caregiver   Family Member Consulted Spouse    Patient continually needs verbal cuing for control of her legs during exercises.    Problem List There are no active problems to display for this patient.   Arbie Blankley, PTA 12/12/2014, 3:32 PM  [facility] [address] [place], [place], [address] Phone: [phone]   Fax:  [phone]

## 2014-12-17 ENCOUNTER — Ambulatory Visit: Payer: Medicare Other | Admitting: Physical Therapy

## 2014-12-17 ENCOUNTER — Encounter: Payer: Self-pay | Admitting: Physical Therapy

## 2014-12-17 DIAGNOSIS — R2681 Unsteadiness on feet: Secondary | ICD-10-CM | POA: Diagnosis not present

## 2014-12-17 DIAGNOSIS — Z9181 History of falling: Secondary | ICD-10-CM

## 2014-12-17 DIAGNOSIS — R2689 Other abnormalities of gait and mobility: Secondary | ICD-10-CM

## 2014-12-17 NOTE — Therapy (Signed)
[facility] [address] [place], [place], [address] Phone: [phone]   Fax:  [phone]  Physical Therapy Treatment  Patient Details  Name: Christine Webster MRN: [id number] Date of Birth: 1941-06-01 Referring Provider:  Juluis RainierBarnes, Elizabeth, MD  Encounter Date: 12/17/2014      PT End of Session - 12/17/14 1533    Visit Number 4   Number of Visits 16   Date for PT Re-Evaluation 01/16/15   PT Start Time 1450   PT Stop Time 1539   PT Time Calculation (min) 49 min   Activity Tolerance Patient limited by fatigue   Behavior During Therapy [facility] for tasks assessed/performed      Past Medical History  Diagnosis Date  . Depression   . Diabetes mellitus without complication   . GERD (gastroesophageal reflux disease)   . Obesity   . Hypertension   . Hyperlipidemia   . Kidney stones   . Vitamin D deficiency     Past Surgical History  Procedure Laterality Date  . Intestinal malrotation repair      There were no vitals taken for this visit.  Visit Diagnosis:  Falls infrequently  Abnormality of gait due to impairment of balance      Subjective Assessment - 12/17/14 1458    Symptoms Legs felt tired after last session. Legs scissored a lot and had hard time with steps at church yesterday. Not doing her exercises yet.    Currently in Pain? No/denies                    [facility] Treatment/Exercise - 12/17/14 0001    Dynamic Gait Index   Level Surface Mild Impairment   Change in Gait Speed Mild Impairment   Gait with Horizontal Head Turns Mild Impairment   Gait with Vertical Head Turns Mild Impairment   Gait and Pivot Turn Normal   Step Over Obstacle Mild Impairment   Step Around Obstacles Normal   Steps Mild Impairment   Total Score 18   Knee/Hip Exercises: Aerobic   Stationary Bike L2 x 10 min   Knee/Hip Exercises: Standing   Other Standing Knee Exercises Hand held side stepping  with red band 7020feet 2x   Knee/Hip Exercises: Seated   Long Arc Quad Strengthening;Both;2 sets;10 reps   Long Arc Quad Weight 2 lbs.   Other Seated Knee Exercises Red band abduction 2x10                  PT Short Term Goals - 12/17/14 1540    PT SHORT TERM GOAL #1   Title Be independent in initial HEP   Time 4   Period Weeks   Status On-going  Non compliant   PT SHORT TERM GOAL #2   Title DGI score is >18/24 points   Time 4   Period Weeks   Status Achieved  Today scored 18/24   PT SHORT TERM GOAL #3   Title Feeling of being off balanced after getting out of bed decreased >/=25%   Time 4   Period Weeks   Status On-going  Patient reports about the same.           PT Long Term Goals - 12/17/14 1541    PT LONG TERM GOAL #1   Title Demonstrate understanding of fall prevention techniques   Time 8   Period Weeks   Status Achieved   PT LONG TERM GOAL #2   Title Independent  in adavnced HEP for balance training   Time 8   Period Weeks   Status On-going  Patient non compliant with initial HEP.   PT LONG TERM GOAL #3   Title DGI score is 22/24 points   Time 8   Period Weeks   Status On-going  Increased score to 18/24 today   PT LONG TERM GOAL #4   Title Step around objects without stumbling   Time 8   Period Weeks   Status On-going  Patient demonstrated no stumbling during DGI test today, but will hold off onmarking this achieved  until consistency is shown.               Plan - 12/17/14 1538    Clinical Impression Statement Patient increased score of DGI today. Tolerated increase in resistance with some exercises today. Patient remains noncompliant with HEP.   Pt will benefit from skilled therapeutic intervention in order to improve on the following deficits Abnormal gait;Decreased activity tolerance;Decreased balance;Decreased endurance;Decreased range of motion;Difficulty walking   PT Frequency 2x / week   PT Duration 8 weeks   PT  Treatment/Interventions Therapeutic activities;Patient/family education;Therapeutic exercise;Gait training;Balance training;Manual techniques;Neuromuscular re-education;Energy conservation;Functional mobility training   PT Next Visit Plan Add sit to stand for HEP   Consulted and Agree with Plan of Care Patient;Family member/caregiver        Problem List There are no active problems to display for this patient.   COCHRAN,JENNIFER, PTA 12/17/2014, 3:44 PM  [facility] [address] [place], [place], [address] Phone: [phone]   Fax:  [phone]

## 2014-12-19 ENCOUNTER — Ambulatory Visit: Payer: Medicare Other | Admitting: Physical Therapy

## 2014-12-19 ENCOUNTER — Encounter: Payer: Self-pay | Admitting: Physical Therapy

## 2014-12-19 DIAGNOSIS — R2689 Other abnormalities of gait and mobility: Secondary | ICD-10-CM

## 2014-12-19 DIAGNOSIS — Z9181 History of falling: Secondary | ICD-10-CM

## 2014-12-19 DIAGNOSIS — R2681 Unsteadiness on feet: Secondary | ICD-10-CM | POA: Diagnosis not present

## 2014-12-19 NOTE — Patient Instructions (Signed)
    Copyright  VHI. All rights reserved.          http://gtsc.exer.us/512   Copyright  VHI. All rights reserved.  Functional Quadriceps: Sit to Stand   Sit on edge of chair, feet flat on floor. Stand upright, extending knees fully. Repeat ___4-6_ times per set. Do __1__ sets per session. Do __1__ sessions per day.  http://orth.exer.us/734   Copyright  VHI. All rights reserved.  Copyright  VHI. All rights reserved.  __ sessions per day.  http://gt2.exer.us/275   Copyright  VHI. All rights reserved.

## 2014-12-19 NOTE — Therapy (Signed)
[facility] [address] [place], [place], [address] Phone: [phone]   Fax:  [phone]  Physical Therapy Treatment  Patient Details  Name: Christine Webster MRN: [id number] Date of Birth: [birth year] Referring Provider:  Juluis Rainier, MD  Encounter Date: 12/19/2014      PT End of Session - 12/19/14 1524    Visit Number 5   Number of Visits 16   Date for PT Re-Evaluation 01/16/15   PT Start Time 1445   PT Stop Time 1530   PT Time Calculation (min) 45 min   Activity Tolerance Patient limited by fatigue   Behavior During Therapy [facility] for tasks assessed/performed      Past Medical History  Diagnosis Date  . Depression   . Diabetes mellitus without complication   . GERD (gastroesophageal reflux disease)   . Obesity   . Hypertension   . Hyperlipidemia   . Kidney stones   . Vitamin D deficiency     Past Surgical History  Procedure Laterality Date  . Intestinal malrotation repair      There were no vitals taken for this visit.  Visit Diagnosis:  Falls infrequently  Abnormality of gait due to impairment of balance      Subjective Assessment - 12/19/14 1447    Symptoms "I felt it after last session." Pt reports doing her exercises yesterday 1x. No pain.    Currently in Pain? No/denies   Multiple Pain Sites No                    [facility] Adult PT Treatment/Exercise - 12/19/14 0001    High Level Balance   High Level Balance Activities Direction changes;Figure 8 turns   High Level Balance Comments Close SBA   Knee/Hip Exercises: Aerobic   Stationary Bike L2 x10 min.   Knee/Hip Exercises: Standing   Hip ADduction Strengthening;Both;2 sets;10 reps   Hip ADduction Limitations Added 2# wts,    Other Standing Knee Exercises Hip extension bil 2# 2x10   Knee/Hip Exercises: Seated   Long Arc Quad Strengthening;Both;1 set;10 reps   Long Arc Quad Weight 3 lbs.   Other Seated Knee  Exercises Green band hip abd 2x10                PT Education - 12/19/14 1515    Education provided Yes   Education Details HEP, sit to stand   Person(s) Educated Patient;Spouse   Methods Explanation;Demonstration;Handout   Comprehension Verbalized understanding;Returned demonstration          PT Short Term Goals - 12/17/14 1540    PT SHORT TERM GOAL #1   Title Be independent in initial HEP   Time 4   Period Weeks   Status On-going  Non compliant   PT SHORT TERM GOAL #2   Title DGI score is >18/24 points   Time 4   Period Weeks   Status Achieved  Today scored 18/24   PT SHORT TERM GOAL #3   Title Feeling of being off balanced after getting out of bed decreased >/=25%   Time 4   Period Weeks   Status On-going  Patient reports about the same.           PT Long Term Goals - 12/17/14 1541    PT LONG TERM GOAL #1   Title Demonstrate understanding of fall prevention techniques   Time 8   Period Weeks   Status Achieved   PT LONG TERM  GOAL #2   Title Independent in adavnced HEP for balance training   Time 8   Period Weeks   Status On-going  Patient non compliant with initial HEP.   PT LONG TERM GOAL #3   Title DGI score is 22/24 points   Time 8   Period Weeks   Status On-going  Increased score to 18/24 today   PT LONG TERM GOAL #4   Title Step around objects without stumbling   Time 8   Period Weeks   Status On-going  Patient demonstrated no stumbling during DGI test today, but will hold off onmarking this achieved  until consistency is shown.               Plan - 12/19/14 1525    Clinical Impression Statement Patient given resistance for exercises to increase her ability to control her LE. Advanced HEP today to include sit to stand.   Pt will benefit from skilled therapeutic intervention in order to improve on the following deficits Abnormal gait;Decreased activity tolerance;Decreased balance;Decreased endurance;Decreased range of  motion;Difficulty walking   PT Frequency 2x / week   PT Duration 8 weeks   PT Treatment/Interventions Therapeutic activities;Patient/family education;Therapeutic exercise;Gait training;Balance training;Manual techniques;Neuromuscular re-education;Energy conservation;Functional mobility training   PT Next Visit Plan Continue with 2# wts, work on stepping over objects   Consulted and Agree with Plan of Care Patient;Family member/caregiver   Family Member Consulted Spouse        Problem List There are no active problems to display for this patient.   Gaylene Moylan, PTA 12/19/2014, 3:28 PM  [facility] [address] [place], [place], [address] Phone: [phone]   Fax:  [phone]

## 2014-12-24 ENCOUNTER — Ambulatory Visit: Payer: Medicare Other | Admitting: Physical Therapy

## 2014-12-26 ENCOUNTER — Ambulatory Visit: Payer: Medicare Other | Admitting: Physical Therapy

## 2014-12-26 ENCOUNTER — Encounter: Payer: Self-pay | Admitting: Physical Therapy

## 2014-12-26 DIAGNOSIS — R2689 Other abnormalities of gait and mobility: Secondary | ICD-10-CM

## 2014-12-26 DIAGNOSIS — Z9181 History of falling: Secondary | ICD-10-CM

## 2014-12-26 DIAGNOSIS — R2681 Unsteadiness on feet: Secondary | ICD-10-CM | POA: Diagnosis not present

## 2014-12-26 NOTE — Therapy (Signed)
[facility] [address] [place], [place], [address] Phone: [phone]   Fax:  [phone]  Physical Therapy Treatment  Patient Details  Name: Christine Webster MRN: [id number] Date of Birth: 04-May-1941 Referring Provider:  Juluis RainierBarnes, Elizabeth, MD  Encounter Date: 12/26/2014      PT End of Session - 12/26/14 1524    Visit Number 6   Number of Visits 16   Date for PT Re-Evaluation 01/16/15   PT Start Time 1445   PT Stop Time 1525   PT Time Calculation (min) 40 min   Activity Tolerance Patient tolerated treatment well   Behavior During Therapy [facility] for tasks assessed/performed      Past Medical History  Diagnosis Date  . Depression   . Diabetes mellitus without complication   . GERD (gastroesophageal reflux disease)   . Obesity   . Hypertension   . Hyperlipidemia   . Kidney stones   . Vitamin D deficiency     Past Surgical History  Procedure Laterality Date  . Intestinal malrotation repair      There were no vitals filed for this visit.  Visit Diagnosis:  Falls infrequently  Abnormality of gait due to impairment of balance      Subjective Assessment - 12/26/14 1444    Symptoms Me and my husband think I"m wobbling less.   Currently in Pain? No/denies                       [facility] Treatment/Exercise - 12/26/14 0001    High Level Balance   High Level Balance Activities Direction changes;Turns;Sudden stops;Figure 8 turns   Knee/Hip Exercises: Aerobic   Stationary Bike L2 10 min   Knee/Hip Exercises: Standing   Hip ADduction Strengthening;Both;2 sets;10 reps   Hip ADduction Limitations Added 2# wts,    Rebounder weight shifting 3 ways each direction   Other Standing Knee Exercises Hip extension bil 2# 2x10   Knee/Hip Exercises: Seated   Long Arc Quad Strengthening;Both;1 set;10 reps   Long Arc Quad Weight 3 lbs.   Other Seated Knee Exercises Sit to stand 10x    Other  Seated Knee Exercises Green band hip abd 2x10                  PT Short Term Goals - 12/26/14 1453    PT SHORT TERM GOAL #1   Title Be independent in initial HEP   Time 4   Period Weeks   Status Achieved   PT SHORT TERM GOAL #2   Title DGI score is >18/24 points   Time 4   Period Weeks   Status Achieved   PT SHORT TERM GOAL #3   Title Feeling of being off balanced after getting out of bed decreased >/=25%   Time 4   Period Weeks   Status Achieved  40%           PT Long Term Goals - 12/26/14 1455    PT LONG TERM GOAL #1   Title Demonstrate understanding of fall prevention techniques   Time 8   Period Weeks   Status Achieved   PT LONG TERM GOAL #2   Title Independent in adavnced HEP for balance training   Time 8   Period Weeks   Status On-going   PT LONG TERM GOAL #3   Title DGI score is 22/24 points   Time 8   Period Weeks   Status  On-going               Plan - 12/26/14 1524    Clinical Impression Statement Really improved endurance and quality of work. Control of exercises continues to improve.   Pt will benefit from skilled therapeutic intervention in order to improve on the following deficits Abnormal gait;Decreased activity tolerance;Decreased balance;Decreased endurance;Decreased range of motion;Difficulty walking   PT Frequency 2x / week   PT Duration 8 weeks   PT Treatment/Interventions Therapeutic activities;Patient/family education;Therapeutic exercise;Gait training;Balance training;Manual techniques;Neuromuscular re-education;Energy conservation;Functional mobility training   PT Next Visit Plan DGI    Consulted and Agree with Plan of Care Patient;Family member/caregiver   Family Member Consulted Spouse        Problem List There are no active problems to display for this patient.   COCHRAN,JENNIFER, PTA 12/26/2014, 3:26 PM  [facility] [address] [place], [place], [address] Phone: [phone]   Fax:  [phone]

## 2014-12-31 ENCOUNTER — Ambulatory Visit: Payer: Medicare Other | Admitting: Physical Therapy

## 2014-12-31 ENCOUNTER — Encounter: Payer: Self-pay | Admitting: Physical Therapy

## 2014-12-31 DIAGNOSIS — Z9181 History of falling: Secondary | ICD-10-CM

## 2014-12-31 DIAGNOSIS — R2681 Unsteadiness on feet: Secondary | ICD-10-CM | POA: Diagnosis not present

## 2014-12-31 DIAGNOSIS — R2689 Other abnormalities of gait and mobility: Secondary | ICD-10-CM

## 2014-12-31 NOTE — Therapy (Signed)
[facility] [address], [address] [place], [place], [address] Phone: [phone]   Fax:  [phone]  Physical Therapy Treatment  Patient Details  Name: Christine Webster MRN: [id number] Date of Birth: 10-16-1940 Referring Provider:  Juluis RainierBarnes, Elizabeth, MD  Encounter Date: 12/31/2014      PT End of Session - 12/31/14 1519    Visit Number 7   Number of Visits 16   Date for PT Re-Evaluation 01/16/15   PT Start Time 1445   PT Stop Time 1525   PT Time Calculation (min) 40 min   Activity Tolerance Patient tolerated treatment well   Behavior [facility] Therapy [facility] for tasks assessed/performed      Past Medical History  Diagnosis Date  . Depression   . Diabetes mellitus without complication   . GERD (gastroesophageal reflux disease)   . Obesity   . Hypertension   . Hyperlipidemia   . Kidney stones   . Vitamin D deficiency     Past Surgical History  Procedure Laterality Date  . Intestinal malrotation repair      There were no vitals filed for this visit.  Visit Diagnosis:  Falls infrequently  Abnormality of gait due to impairment of balance      Subjective Assessment - 12/31/14 1443    Symptoms Hip muscles were sore after last session. Have not done any HEP since being here last.    Currently in Pain? Yes   Pain Score 2    Pain Location Leg   Pain Orientation Right;Left;Posterior;Proximal   Pain Descriptors / Indicators Sore   Pain Type --  Possibly muscular   Pain Onset In the past 7 days   Aggravating Factors  Thinks she just got sore from hip exercises   Pain Relieving Factors Rest   Multiple Pain Sites No                       [facility] Adult PT Treatment/Exercise - 12/31/14 0001    High Level Balance   High Level Balance Activities Direction changes;Turns;Sudden stops;Figure 8 turns;Negotitating around obstacles   Knee/Hip Exercises: Aerobic   Stationary Bike L2 10 min   Knee/Hip  Exercises: Standing   Hip ADduction Strengthening;Both;2 sets;10 reps   Hip ADduction Limitations 2#   Rebounder weight shifting 3 ways each direction   Other Standing Knee Exercises Hip extension bil 2# 2x10   Knee/Hip Exercises: Seated   Long Arc Quad Strengthening;Both;20 reps   Long Arc Quad Weight 3 lbs.   Other Seated Knee Exercises Sit to stand 10x    Other Seated Knee Exercises Green band hip abd 2x10                  PT Short Term Goals - 12/31/14 1449    PT SHORT TERM GOAL #1   Title Be independent in initial HEP   Time 4   Period Weeks   Status Achieved   PT SHORT TERM GOAL #2   Title DGI score is >18/24 points   Time 4   Period Weeks   Status Achieved   PT SHORT TERM GOAL #3   Title Feeling of being off balanced after getting out of bed decreased >/=25%   Time 4   Period Weeks   Status Achieved           PT Long Term Goals - 12/31/14 1450    PT LONG TERM GOAL #1   Title Demonstrate understanding of  fall prevention techniques   Time 8   Period Weeks   Status Achieved   PT LONG TERM GOAL #2   Title Independent in adavnced HEP for balance training   Time 8   Period Weeks   Status On-going   PT LONG TERM GOAL #3   Title DGI score is 22/24 points   Time 8   Period Weeks   Status On-going   PT LONG TERM GOAL #4   Title Step around objects without stumbling   Time 8   Period Weeks   Status Achieved   PT LONG TERM GOAL #5   Title Stop and turn quickly without stumbling   Time 8   Status On-going  Continues to improve   PT LONG TERM GOAL #6   Title Reports no falls since therapy started due to increased balance   Time 8   Period Weeks   Status On-going  No falls so far   PT LONG TERM GOAL #7   Title Feeling of being off balanced after getting out of bed decreased >/=75%   Time 8   Period Weeks   Status On-going  Patient reports now that she is thinkning about it more she might be more fearful.               Plan -  12/31/14 1519    Clinical Impression Statement Felt better in her legs as she went through her exercises.Continues to fatigue BUT her control continues to improve.   Pt will benefit from skilled therapeutic intervention in order to improve on the following deficits Abnormal gait;Decreased activity tolerance;Decreased balance;Decreased endurance;Decreased range of motion;Difficulty walking   PT Frequency 2x / week   PT Duration 8 weeks   PT Treatment/Interventions Therapeutic activities;Patient/family education;Therapeutic exercise;Gait training;Balance training;Manual techniques;Neuromuscular re-education;Energy conservation;Functional mobility training   PT Next Visit Plan DGI    Consulted and Agree with Plan of Care Patient;Family member/caregiver   Family Member Consulted Spouse        Problem List There are no active problems to display for this patient.   Tae Robak, PTA 12/31/2014, 3:21 PM  [facility] [address], [address] [place], [place], [address] Phone: [phone]   Fax:  [phone]

## 2015-01-02 ENCOUNTER — Ambulatory Visit: Payer: Medicare Other | Admitting: Physical Therapy

## 2015-01-02 ENCOUNTER — Encounter: Payer: Self-pay | Admitting: Physical Therapy

## 2015-01-02 DIAGNOSIS — R2689 Other abnormalities of gait and mobility: Secondary | ICD-10-CM

## 2015-01-02 DIAGNOSIS — R2681 Unsteadiness on feet: Secondary | ICD-10-CM | POA: Diagnosis not present

## 2015-01-02 DIAGNOSIS — Z9181 History of falling: Secondary | ICD-10-CM

## 2015-01-02 NOTE — Therapy (Signed)
[facility] [address] [place], [place], [address] Phone: [phone]   Fax:  [phone]  Physical Therapy Treatment  Patient Details  Name: Christine Webster MRN: [id number] Date of Birth: 03-Sep-1941 Referring Provider:  Juluis Rainier, MD  Encounter Date: 01/02/2015      PT End of Session - 01/02/15 1513    Visit Number 8   Number of Visits 16   Date for PT Re-Evaluation 01/16/15   PT Start Time 1445   PT Stop Time 1525   PT Time Calculation (min) 40 min   Activity Tolerance Patient tolerated treatment well   Behavior During Therapy [facility] for tasks assessed/performed      Past Medical History  Diagnosis Date  . Depression   . Diabetes mellitus without complication   . GERD (gastroesophageal reflux disease)   . Obesity   . Hypertension   . Hyperlipidemia   . Kidney stones   . Vitamin D deficiency     Past Surgical History  Procedure Laterality Date  . Intestinal malrotation repair      There were no vitals filed for this visit.  Visit Diagnosis:  Falls infrequently  Abnormality of gait due to impairment of balance      Subjective Assessment - 01/02/15 1444    Symptoms Was not sore after last session. No new complaints.   Currently in Pain? No/denies   Multiple Pain Sites No   Multiple Pain Sites No                       [facility] Adult PT Treatment/Exercise - 01/02/15 0001    Dynamic Gait Index   Level Surface Normal   Change in Gait Speed Mild Impairment   Gait with Horizontal Head Turns Mild Impairment   Gait with Vertical Head Turns Mild Impairment   Gait and Pivot Turn Normal   Step Over Obstacle Normal   Step Around Obstacles Normal   Steps Mild Impairment   Total Score 20   Knee/Hip Exercises: Aerobic   Stationary Bike L0 on [place] x 10 min   Knee/Hip Exercises: Standing   Hip ADduction Strengthening;Both;2 sets;10 reps   Hip ADduction Limitations 2#   Rebounder weight shifting 3 ways each direction   Knee/Hip Exercises: Seated   Long Arc Quad Strengthening;Both;20 reps   Long Arc Quad Weight 3 lbs.   Other Seated Knee Exercises Sit to stand 10x    Other Seated Knee Exercises Green band hip abd 2x10                  PT Short Term Goals - 01/02/15 1517    PT SHORT TERM GOAL #1   Title Be independent in initial HEP   Time 4   Period Weeks   Status Achieved   PT SHORT TERM GOAL #2   Title DGI score is >18/24 points   Time 4   Period Weeks   Status Achieved   PT SHORT TERM GOAL #3   Title Feeling of being off balanced after getting out of bed decreased >/=25%   Time 4   Period Weeks   Status Achieved           PT Long Term Goals - 01/02/15 1517    PT LONG TERM GOAL #1   Title Demonstrate understanding of fall prevention techniques   Time 8   Period Weeks   Status Achieved   PT LONG TERM GOAL #2  Title Independent in adavnced HEP for balance training   Time 8   Period Weeks   Status On-going   PT LONG TERM GOAL #3   Title DGI score is 22/24 points   Time 8   Period Weeks   Status On-going  20/24 today   PT LONG TERM GOAL #4   Title Step around objects without stumbling   Time 8   Period Weeks   Status Achieved   PT LONG TERM GOAL #5   Title Stop and turn quickly without stumbling   Time 8   Period Weeks   Status Achieved   PT LONG TERM GOAL #7   Title Feeling of being off balanced after getting out of bed decreased >/=75%   Time 8   Period Weeks   Status On-going               Plan - 01/02/15 1515    Clinical Impression Statement Patient improved DGI by 2 pts this week.    Pt will benefit from skilled therapeutic intervention in order to improve on the following deficits Abnormal gait;Decreased activity tolerance;Decreased balance;Decreased endurance;Decreased range of motion;Difficulty walking   PT Frequency 2x / week   PT Duration 8 weeks   PT Treatment/Interventions Therapeutic  activities;Patient/family education;Therapeutic exercise;Gait training;Balance training;Manual techniques;Neuromuscular re-education;Energy conservation;Functional mobility training   PT Next Visit Plan Balance and hip strengthening exercises   Consulted and Agree with Plan of Care Patient;Family member/caregiver   Family Member Consulted Spouse        Problem List There are no active problems to display for this patient.   Mathayus Stanbery,PTA 01/02/2015, 3:33 PM  [facility] [address], [address] [place], [place], [address] Phone: [phone]   Fax:  [phone]

## 2015-01-18 NOTE — Therapy (Signed)
[facility] [address] [place], [place], [address] Phone: [phone]   Fax:  [phone]  Patient Details  Name: Christine Webster MRN: [id number] Date of Birth: [birth year] Referring Provider:  Leighton Ruff, MD  Encounter Date: 01/02/2015 PHYSICAL THERAPY DISCHARGE SUMMARY  Visits from Start of Care: 8  Current functional level related to goals / functional outcomes: Dynamic Gait score is 20 with mild impairment of change in gait speed, gait with horizontal head turns and vertical turns, and steps. Patient has met 3 out of 6 LTG's.    Remaining deficits: Patient feels off balance at times due to decreased balance.     Education / Equipment: HEP Plan:                                                    Patient goals were partially met. Patient is being discharged due to not returning since the last visit.  Thank you for the referral. ?????      GRAY,CHERYL,PT 01/18/2015, 8:53 AM  [facility] [address] [place], [place], [address] Phone: [phone]   Fax:  [phone]

## 2015-06-07 ENCOUNTER — Other Ambulatory Visit: Payer: Self-pay

## 2015-06-07 DIAGNOSIS — Z1231 Encounter for screening mammogram for malignant neoplasm of breast: Secondary | ICD-10-CM

## 2015-06-12 ENCOUNTER — Ambulatory Visit
Admission: RE | Admit: 2015-06-12 | Discharge: 2015-06-12 | Disposition: A | Discharge status: Home / Self Care | Payer: Medicare Other | Source: Ambulatory Visit

## 2015-06-12 DIAGNOSIS — Z1231 Encounter for screening mammogram for malignant neoplasm of breast: Secondary | ICD-10-CM

## 2015-10-07 ENCOUNTER — Ambulatory Visit (INDEPENDENT_AMBULATORY_CARE_PROVIDER_SITE_OTHER): Payer: Medicare Other | Admitting: Podiatry

## 2015-10-07 ENCOUNTER — Encounter: Payer: Self-pay | Admitting: Podiatry

## 2015-10-07 DIAGNOSIS — B351 Tinea unguium: Secondary | ICD-10-CM | POA: Diagnosis not present

## 2015-10-07 DIAGNOSIS — M79676 Pain in unspecified toe(s): Secondary | ICD-10-CM

## 2015-10-07 DIAGNOSIS — L84 Corns and callosities: Secondary | ICD-10-CM | POA: Diagnosis not present

## 2015-10-07 NOTE — Progress Notes (Signed)
   Subjective:    Patient ID: Christine Webster, female    DOB: 09-07-1941, 74 y.o.   MRN: [id number]  HPI I HAVE A PAINFUL CALLUS ON THE BOTTOM OF MY LEFT FOOT AND I NEED MY TOENAILS TRIMMED UP AND HAS BEEN GOING ON FOR ABOUT 6 MONTHS AND HURTS WHEN I GO BAREFOOT.  This patient presents to the office with chief complaint of a callus on the outside bottom of her left foot. She states this is painful when she walks on any flooring except carpet. She is able to walk in shoes and not experience much pain. She says she she is prediabetic and presents the office today for an evaluation and treatment of this condition    Review of Systems  All other systems reviewed and are negative.      Objective:   Physical Exam GENERAL APPEARANCE: Alert, conversant. Appropriately groomed. No acute distress.  VASCULAR: Pedal pulses palpable at  [facility] and PT bilateral.  Capillary refill time is immediate to all digits,  Normal temperature gradient.  Digital hair growth is present bilateral  NEUROLOGIC: sensation is normal to 5.07 monofilament at 5/5 sites bilateral.  Light touch is intact bilateral, Muscle strength normal.  MUSCULOSKELETAL: acceptable muscle strength, tone and stability bilateral.  Intrinsic muscluature intact bilateral.  Rectus appearance of foot and digits noted bilateral. DJD 1st MPJ B/L.    DERMATOLOGIC: skin color, texture, and turgor are within normal limits.  No preulcerative lesions or ulcers  are seen, no interdigital maceration noted.  No open lesions present.  . No drainage noted. Plantar tyloma sub 5th met left foot.  NAILS  Thick disfigured discolored nails both feet big toes.         Assessment & Plan:  Plantar Tyloma left foot   Onychomycosis   Debridement and grinding nails.  Debride plantar tyloma.  RTC 3 months.   Gardiner Barefoot DPM

## 2015-12-20 ENCOUNTER — Emergency Department (HOSPITAL_COMMUNITY): Payer: Medicare Other

## 2015-12-20 ENCOUNTER — Emergency Department (HOSPITAL_BASED_OUTPATIENT_CLINIC_OR_DEPARTMENT_OTHER)
Admission: EM | Admit: 2015-12-20 | Discharge: 2015-12-21 | Disposition: A | Discharge status: Home / Self Care | Payer: Medicare Other | Attending: Emergency Medicine | Admitting: Emergency Medicine

## 2015-12-20 ENCOUNTER — Emergency Department (HOSPITAL_BASED_OUTPATIENT_CLINIC_OR_DEPARTMENT_OTHER): Payer: Medicare Other

## 2015-12-20 ENCOUNTER — Encounter (HOSPITAL_BASED_OUTPATIENT_CLINIC_OR_DEPARTMENT_OTHER): Payer: Self-pay | Admitting: *Deleted

## 2015-12-20 DIAGNOSIS — I10 Essential (primary) hypertension: Secondary | ICD-10-CM | POA: Insufficient documentation

## 2015-12-20 DIAGNOSIS — R4182 Altered mental status, unspecified: Secondary | ICD-10-CM | POA: Diagnosis present

## 2015-12-20 DIAGNOSIS — E119 Type 2 diabetes mellitus without complications: Secondary | ICD-10-CM | POA: Diagnosis not present

## 2015-12-20 DIAGNOSIS — G459 Transient cerebral ischemic attack, unspecified: Secondary | ICD-10-CM | POA: Insufficient documentation

## 2015-12-20 DIAGNOSIS — R6889 Other general symptoms and signs: Secondary | ICD-10-CM

## 2015-12-20 DIAGNOSIS — R410 Disorientation, unspecified: Secondary | ICD-10-CM

## 2015-12-20 LAB — COMPREHENSIVE METABOLIC PANEL
ALBUMIN: 3.9 g/dL (ref 3.5–5.0)
ALK PHOS: 90 U/L (ref 38–126)
ALT: 11 U/L — AB (ref 14–54)
AST: 21 U/L (ref 15–41)
Anion gap: 8 (ref 5–15)
BILIRUBIN TOTAL: 0.7 mg/dL (ref 0.3–1.2)
BUN: 10 mg/dL (ref 6–20)
CALCIUM: 10.2 mg/dL (ref 8.9–10.3)
CO2: 27 mmol/L (ref 22–32)
CREATININE: 0.66 mg/dL (ref 0.44–1.00)
Chloride: 101 mmol/L (ref 101–111)
GFR calc Af Amer: >60 mL/min (ref >60)
GLUCOSE: 114 mg/dL — AB (ref 65–99)
POTASSIUM: 3.3 mmol/L — AB (ref 3.5–5.1)
Sodium: 136 mmol/L (ref 135–145)
TOTAL PROTEIN: 7.3 g/dL (ref 6.5–8.1)

## 2015-12-20 LAB — CBC WITH DIFFERENTIAL/PLATELET
BASOS PCT: 0 %
Basophils Absolute: 0 10*3/uL (ref 0.0–0.1)
EOS PCT: 0 %
Eosinophils Absolute: 0 10*3/uL (ref 0.0–0.7)
HEMATOCRIT: 40.2 % (ref 36.0–46.0)
Hemoglobin: 13.5 g/dL (ref 12.0–15.0)
Lymphocytes Relative: 19 %
Lymphs Abs: 1 10*3/uL (ref 0.7–4.0)
MCH: 27.7 pg (ref 26.0–34.0)
MCHC: 33.6 g/dL (ref 30.0–36.0)
MCV: 82.5 fL (ref 78.0–100.0)
MONO ABS: 0.3 10*3/uL (ref 0.1–1.0)
MONOS PCT: 6 %
NEUTROS ABS: 3.9 10*3/uL (ref 1.7–7.7)
Neutrophils Relative %: 75 %
PLATELETS: 168 10*3/uL (ref 150–400)
RBC: 4.87 MIL/uL (ref 3.87–5.11)
RDW: 14.3 % (ref 11.5–15.5)
WBC: 5.2 10*3/uL (ref 4.0–10.5)

## 2015-12-20 LAB — URINALYSIS, ROUTINE W REFLEX MICROSCOPIC
Glucose, UA: NEGATIVE mg/dL
Hgb urine dipstick: NEGATIVE
Ketones, ur: NEGATIVE mg/dL
Leukocytes, UA: NEGATIVE
Nitrite: NEGATIVE
Protein, ur: NEGATIVE mg/dL
Specific Gravity, Urine: 1.02 (ref 1.005–1.030)
pH: 6 (ref 5.0–8.0)

## 2015-12-20 LAB — LIPASE, BLOOD: LIPASE: 17 U/L (ref 11–51)

## 2015-12-20 MED ORDER — IOHEXOL 300 MG/ML  SOLN
80.0000 mL | Freq: Once | INTRAMUSCULAR | Status: AC | PRN
  Administered 2015-12-20: 80 mL via INTRAVENOUS

## 2015-12-20 MED ORDER — SODIUM CHLORIDE 0.9 % IV BOLUS (SEPSIS)
500.0000 mL | Freq: Once | INTRAVENOUS | Status: AC
  Administered 2015-12-20: 13:00:00 via INTRAVENOUS

## 2015-12-20 MED ORDER — GADOBENATE DIMEGLUMINE 529 MG/ML IV SOLN: 15.0000 mL | Freq: Once | INTRAVENOUS | Status: DC | PRN

## 2015-12-20 MED ORDER — SODIUM CHLORIDE 0.9 % IV SOLN: INTRAVENOUS | Status: DC

## 2015-12-20 NOTE — ED Notes (Signed)
Pt taken to restroom via steady seat on wheels.

## 2015-12-20 NOTE — ED Notes (Signed)
Confusion since last night. Sore throat, cough, body aches and fever for a week.

## 2015-12-20 NOTE — ED Notes (Signed)
Pt still at [facility]

## 2015-12-20 NOTE — ED Notes (Addendum)
Spoke with shift supervisor from [facility]. They will send a unit for transport

## 2015-12-20 NOTE — ED Notes (Signed)
Vomited a large amt of green liquid.  Pt and bed cleaned and changed

## 2015-12-20 NOTE — ED Notes (Signed)
[facility] aware of transport to [place]

## 2015-12-20 NOTE — ED Notes (Signed)
[facility] from [facility] --

## 2015-12-20 NOTE — ED Provider Notes (Signed)
CSN: [id number]     Arrival date & time 12/20/15  1108 History   First MD Initiated Contact with Patient 12/20/15 1124     Chief Complaint  Patient presents with  . Altered Mental Status     (Consider location/radiation/quality/duration/timing/severity/associated sxs/prior Treatment) The history is provided by the patient and the spouse.  75 year old female referred in from the [facility]. Patient since Sunday has had the low-grade fevers cough congestion body aches decreased appetite and occasional vomiting. No diarrhea reappeared. Last night husband noted that patient got up and went outside without notice he found her out there she seemed confused speech seemed to be garbled. Still feels that speech isn't quite right today. Patient's had a history of get fullness now for several months. It is never had anything like this happen. No past history of stroke.  Past Medical History  Diagnosis Date  . Depression   . Diabetes mellitus without complication (HCC)   . GERD (gastroesophageal reflux disease)   . Obesity   . Hypertension   . Hyperlipidemia   . Kidney stones   . Vitamin D deficiency    Past Surgical History  Procedure Laterality Date  . Intestinal malrotation repair     No family history on file. Social History  Substance Use Topics  . Smoking status: Never Smoker   . Smokeless tobacco: None  . Alcohol Use: None   OB History    No data available     Review of Systems  Constitutional: Positive for fever. Negative for appetite change.  HENT: Positive for congestion.   Eyes: Negative for redness.  Respiratory: Positive for cough and shortness of breath.   Cardiovascular: Negative for chest pain.  Gastrointestinal: Positive for nausea and vomiting. Negative for diarrhea.  Genitourinary: Negative for hematuria.  Musculoskeletal: Positive for myalgias.  Skin: Negative for rash.  Neurological: Positive for speech difficulty. Negative for seizures,  syncope, facial asymmetry, weakness and headaches.  Hematological: Does not bruise/bleed easily.  Psychiatric/Behavioral: Positive for confusion.      Allergies  Review of patient's allergies indicates no known allergies.  Home Medications   Prior to Admission medications   Medication Sig Start Date End Date Taking? Authorizing Provider  atorvastatin (LIPITOR) 10 MG tablet TAKE 1 TABLET ONCE A DAY ORALLY 90 DAYS 09/30/15  Yes Historical Provider, MD  buPROPion (ZYBAN) 150 MG 12 hr tablet Take 150 mg by mouth 2 (two) times daily.   Yes Historical Provider, MD  esomeprazole (NEXIUM) 40 MG capsule Take 40 mg by mouth daily before breakfast.   Yes Historical Provider, MD  FLUoxetine (PROZAC) 20 MG capsule Take 20 mg by mouth daily.   Yes Historical Provider, MD  Vitamin D, Ergocalciferol, (DRISDOL) 50000 UNITS CAPS capsule Take 50,000 Units by mouth.   Yes Historical Provider, MD  buPROPion (WELLBUTRIN XL) 150 MG 24 hr tablet Take 150 mg by mouth every morning. 07/22/15   Historical Provider, MD   BP 133/75 mmHg  Pulse 61  Temp(Src) 99.2 F (37.3 C) (Oral)  Resp 19  Ht  (1.702 m)  Wt 78.926 kg  BMI 27.25 kg/m2  SpO2 93% Physical Exam  Constitutional: She appears well-developed and well-nourished. No distress.  HENT:  Head: Normocephalic and atraumatic.  Mucous membranes dry.  Eyes: Conjunctivae and EOM are normal. Pupils are equal, round, and reactive to light.  Neck: Normal range of motion. Neck supple.  Cardiovascular: Normal rate, regular rhythm and normal heart sounds.   No murmur heard.  Pulmonary/Chest: Effort normal and breath sounds normal. No respiratory distress. She has no wheezes. She has no rales.  Abdominal: Soft. Bowel sounds are normal. There is no tenderness.  Musculoskeletal: Normal range of motion. She exhibits no tenderness.  Neurological: She is alert. No cranial nerve deficit. She exhibits normal muscle tone. Coordination normal.  Except for pronator  drift. With eyes closed left arm drifts upward.  Skin: Skin is warm. No rash noted.  Nursing note and vitals reviewed.   ED Course  Procedures (including critical care time) Labs Review Labs Reviewed  URINALYSIS, ROUTINE W REFLEX MICROSCOPIC (NOT AT [facility]) - Abnormal; Notable for the following:    Color, Urine AMBER (*)    Bilirubin Urine SMALL (*)    All other components within normal limits  COMPREHENSIVE METABOLIC PANEL - Abnormal; Notable for the following:    Potassium 3.3 (*)    Glucose, Bld 114 (*)    ALT 11 (*)    All other components within normal limits  LIPASE, BLOOD  CBC WITH DIFFERENTIAL/PLATELET   Results for orders placed or performed during the hospital encounter of 12/20/15  Urinalysis, Routine w reflex microscopic (not at [facility])  Result Value Ref Range   Color, Urine AMBER (A) YELLOW   APPearance CLEAR CLEAR   Specific Gravity, Urine 1.020 1.005 - 1.030   pH 6.0 5.0 - 8.0   Glucose, UA NEGATIVE NEGATIVE mg/dL   Hgb urine dipstick NEGATIVE NEGATIVE   Bilirubin Urine SMALL (A) NEGATIVE   Ketones, ur NEGATIVE NEGATIVE mg/dL   Protein, ur NEGATIVE NEGATIVE mg/dL   Nitrite NEGATIVE NEGATIVE   Leukocytes, UA NEGATIVE NEGATIVE  Comprehensive metabolic panel  Result Value Ref Range   Sodium 136 135 - 145 mmol/L   Potassium 3.3 (L) 3.5 - 5.1 mmol/L   Chloride 101 101 - 111 mmol/L   CO2 27 22 - 32 mmol/L   Glucose, Bld 114 (H) 65 - 99 mg/dL   BUN 10 6 - 20 mg/dL   Creatinine, Ser 1.610.66 0.44 - 1.00 mg/dL   Calcium 09.610.2 8.9 - 04.510.3 mg/dL   Total Protein 7.3 6.5 - 8.1 g/dL   Albumin 3.9 3.5 - 5.0 g/dL   AST 21 15 - 41 U/L   ALT 11 (L) 14 - 54 U/L   Alkaline Phosphatase 90 38 - 126 U/L   Total Bilirubin 0.7 0.3 - 1.2 mg/dL   GFR calc non Af Amer >60 >60 mL/min   GFR calc Af Amer >60 >60 mL/min   Anion gap 8 5 - 15  Lipase, blood  Result Value Ref Range   Lipase 17 11 - 51 U/L  CBC with Differential/Platelet  Result Value Ref Range   WBC 5.2 4.0 - 10.5 K/uL    RBC 4.87 3.87 - 5.11 MIL/uL   Hemoglobin 13.5 12.0 - 15.0 g/dL   HCT 40.940.2 81.136.0 - 91.446.0 %   MCV 82.5 78.0 - 100.0 fL   MCH 27.7 26.0 - 34.0 pg   MCHC 33.6 30.0 - 36.0 g/dL   RDW 78.214.3 95.611.5 - 21.315.5 %   Platelets 168 150 - 400 K/uL   Neutrophils Relative % 75 %   Neutro Abs 3.9 1.7 - 7.7 K/uL   Lymphocytes Relative 19 %   Lymphs Abs 1.0 0.7 - 4.0 K/uL   Monocytes Relative 6 %   Monocytes Absolute 0.3 0.1 - 1.0 K/uL   Eosinophils Relative 0 %   Eosinophils Absolute 0.0 0.0 - 0.7 K/uL   Basophils Relative  0 %   Basophils Absolute 0.0 0.0 - 0.1 K/uL     Imaging Review Dg Chest 2 View  12/20/2015  CLINICAL DATA:  Cough EXAM: CHEST  2 VIEW COMPARISON:  None. FINDINGS: Heart size within normal limits.  Negative for heart failure. Right hilar density could represent mass or adenopathy. Superimposed pneumonia also possible. Mild linear scarring or atelectasis on the left upper lobe anteriorly. No heart failure or effusion. IMPRESSION: Right hilar density. This may represent mass or adenopathy versus pneumonia. CT chest with contrast recommended for further evaluation Electronically Signed   By: Marlan Palau M.D.   On: 12/20/2015 12:48   Ct Head Wo Contrast  12/20/2015  CLINICAL DATA:  Confusion beginning last night. Sore throat and cough. Body aches and fever. EXAM: CT HEAD WITHOUT CONTRAST TECHNIQUE: Contiguous axial images were obtained from the base of the skull through the vertex without intravenous contrast. COMPARISON:  None. FINDINGS: Mild to moderate generalized atrophy and diffuse white matter changes are present bilaterally. The basal ganglia are intact. The ventricles are proportionate to the degree of atrophy. No significant extra-axial fluid collection is present. The insular cortex is normal bilaterally. Cerebellar atrophy is present bilaterally as well. The paranasal sinuses and mastoid air cells are clear. The globes and orbits are intact. The calvarium is within normal limits. No  significant extracranial soft tissue lesion is present. IMPRESSION: 1. Mild to moderate generalized atrophy and diffuse white matter disease likely reflects the sequela of chronic microvascular ischemia. 2. No acute intracranial abnormality. Electronically Signed   By: Marin Roberts M.D.   On: 12/20/2015 12:30   Ct Chest W Contrast  12/20/2015  CLINICAL DATA:  Follow-up abnormal chest radiograph performed today suggesting possible mass ; personal history of thyroid tumor EXAM: CT CHEST WITH CONTRAST TECHNIQUE: Multidetector CT imaging of the chest was performed during intravenous contrast administration. CONTRAST:  80mL OMNIPAQUE IOHEXOL 300 MG/ML  SOLN COMPARISON:  12/20/2015 FINDINGS: Mediastinum/Nodes: The right lobe of the thyroid is enlarged by an approximate 2.8 cm subtle hypodense mass associated with a few foci of calcification. There is a prevascular lymph node measuring 6 mm in short axis. There is a precarinal lymph node measuring 9 mm in short axis. Although both are prominent they are not pathologic by size criteria. There is no right hilar mass or adenopathy. There is postsurgical change at the gastroesophageal junction. There is a significant hiatal hernia. The esophagus is diffusely distended and fluid-filled. There is significant ectasia but no dilatation of the thoracic aorta. Trace pericardial effusion. Heart size is normal. There is coronary artery calcification. Lungs/Pleura: There is no pleural effusion.  The lungs are clear. Upper abdomen: Images through the upper abdomen show a moderate to large hiatal hernia. There is mild diffuse hepatic steatosis. Musculoskeletal: There are no acute musculoskeletal findings. IMPRESSION: No evidence of right hilar adenopathy or mass. The appearance of the chest radiograph is largely due to significant descending thoracic aortic ectasia. Moderate to large hiatal hernia. The esophagus is fluid-filled and distended. Correlate with known history of  right thyroid mass. There is a 2.8 cm solid nodule lower pole thyroid on the right with a few foci of calcification. Ultrasound evaluation would be suggested on less correlation with history alleviates the need for further evaluation. Electronically Signed   By: Esperanza Heir M.D.   On: 12/20/2015 13:49   I have personally reviewed and evaluated these images and lab results as part of my medical decision-making.   EKG Interpretation  Date/Time:  Friday December 20 2015 11:29:30 EST Ventricular Rate:  68 PR Interval:  164 QRS Duration: 112 QT Interval:  461 QTC Calculation: 490 R Axis:   23 Text Interpretation:  Sinus rhythm Probable left atrial enlargement  Incomplete right bundle branch block Low voltage, precordial leads  Borderline prolonged QT interval Confirmed by Mateo Overbeck  MD, Icarus Partch ([id number])  on 12/20/2015 11:32:59 AM      MDM   Final diagnoses:  Flu-like symptoms  Confusion   The patient clearly has flulike illness upper respiratory infection since Sunday. No complicating factors associated with that. Room air sats are normal. Clinically dehydration but no dehydration notable on labs. Chest x-rays negative for pneumonia. In addition patient does have some baseline dementia where she gets for full. But last evening husband noticed some mild speech abnormalities some garbled speech and that she walked out of the house unknown to him and he found her outside. Today he feels speech is still a little bit off. On neuro exam she did have a pronator drift. Patient being sent in to [facility] emergency department for MRI. If MRI shows evidence of a stroke or any other significant abnormality requiring admission she'll need to be admitted there. If it is negative she can be discharged home. Not able to ascertain whether the pronator drift is new or not could've been there 2 months ago.  Addendum: The patient clearly has a flulike illness since Sunday. Overall the upper respiratory infection so  signs of improvement. With hydration here patient feeling better overall.   But the concern is for the confusion speech problems that occurred last night that could prostate represent an acute stroke. In addition to the pronator drift finding on exam. Husband still feels speech is off. MRI needed to rule out CVA. If MRI negative patient can be discharged home.  Vanetta Mulders, MD 12/20/15 [phone]

## 2015-12-20 NOTE — ED Provider Notes (Signed)
CSN: [id number]     Arrival date & time 12/20/15  1108 History   First MD Initiated Contact with Patient 12/20/15 1124     Chief Complaint  Patient presents with  . Altered Mental Status      HPI  75 year old female presenting as a transfer from an outside hospital with altered mental status. History is obtained from the patient and her husband. They report that the patient has had 3 days of low-grade fever up to 100.2 degrees, dry cough, sore throat, and headaches. This morning the patient woke up around midnight and her husband found her wandering around their garage with garbled speech. He reports that her speech was not necessarily slurred, but that she seemed to be mumbling. The patient does not remember this event. The patient went back to sleep and upon waking this morning was confused as to where she was so her husband brought her to the emergency department. At the outside ED the patient was noted to have pronator drift bilaterally. CT head was obtained that showed no acute intracranial abnormalities. The patient was transferred here for MRI.  The patient's husband reports that she is forgetful at baseline however is usually fully oriented. Patient denies any numbness or weakness and has been ambulating without difficulty. Furthermore denies nausea, vomiting, dysuria, or severe headache. Furthermore she denies abdominal pain. No diarrhea or bright red blood per rectum. She denies prior history of stroke or TIA.  Past Medical History  Diagnosis Date  . Depression   . Diabetes mellitus without complication (HCC)   . GERD (gastroesophageal reflux disease)   . Obesity   . Hypertension   . Hyperlipidemia   . Kidney stones   . Vitamin D deficiency    Past Surgical History  Procedure Laterality Date  . Intestinal malrotation repair     No family history on file. Social History  Substance Use Topics  . Smoking status: Never Smoker   . Smokeless tobacco: None  . Alcohol Use: None    OB History    No data available     Review of Systems  Constitutional: Positive for fever (low grade). Negative for chills, activity change and appetite change.  HENT: Positive for rhinorrhea and sore throat. Negative for congestion.   Eyes: Negative for visual disturbance.  Respiratory: Positive for cough. Negative for shortness of breath and wheezing.   Cardiovascular: Negative for chest pain and palpitations.  Gastrointestinal: Negative for nausea, vomiting, abdominal pain, diarrhea, constipation, blood in stool and abdominal distention.  Genitourinary: Negative for dysuria, frequency and flank pain.  Musculoskeletal: Positive for myalgias (diffuse). Negative for back pain, joint swelling, arthralgias, gait problem, neck pain and neck stiffness.  Skin: Negative for rash.  Neurological: Positive for speech difficulty (resolved) and headaches. Negative for dizziness, tremors, syncope, facial asymmetry, weakness and numbness.  Psychiatric/Behavioral: Positive for confusion. Negative for behavioral problems and agitation.      Allergies  Review of patient's allergies indicates no known allergies.  Home Medications   Prior to Admission medications   Medication Sig Start Date End Date Taking? Authorizing Provider  atorvastatin (LIPITOR) 10 MG tablet Take 10 mg by mouth daily.   Yes Historical Provider, MD  buPROPion (WELLBUTRIN XL) 150 MG 24 hr tablet Take 150 mg by mouth daily.  07/22/15  Yes Historical Provider, MD  Cholecalciferol (VITAMIN D PO) Take 1 tablet by mouth daily.   Yes Historical Provider, MD  esomeprazole (NEXIUM) 40 MG capsule Take 40 mg by mouth 2 (two)  times daily before a meal.    Yes Historical Provider, MD  FLUoxetine (PROZAC) 20 MG capsule Take 20 mg by mouth daily.   Yes Historical Provider, MD   BP 135/77 mmHg  Pulse 66  Temp(Src) 100 F (37.8 C) (Oral)  Resp 22  Ht 5\' 7"  (1.702 m)  Wt 78.926 kg  BMI 27.25 kg/m2  SpO2 98% Physical Exam   Constitutional: She appears well-developed and well-nourished. No distress.  HENT:  Head: Normocephalic and atraumatic.  Right Ear: External ear normal.  Left Ear: External ear normal.  Nose: Nose normal.  Mouth/Throat: Oropharynx is clear and moist. No oropharyngeal exudate.  Eyes: Conjunctivae and EOM are normal. Pupils are equal, round, and reactive to light. Right eye exhibits no discharge. Left eye exhibits no discharge.  Neck: Normal range of motion. Neck supple.  Cardiovascular: Normal rate, regular rhythm and normal heart sounds.  Exam reveals no gallop and no friction rub.   No murmur heard. Pulmonary/Chest: Effort normal and breath sounds normal. No respiratory distress. She has no wheezes. She has no rales.  Abdominal: Soft. Bowel sounds are normal. She exhibits no distension and no mass. There is no tenderness. There is no rebound and no guarding.  Musculoskeletal: Normal range of motion. She exhibits no edema or tenderness.  Neurological: She is alert. She exhibits normal muscle tone.  Face symmetric, tongue midline. 5/5 strength in the proximal and distal upper and lower extremities bilaterally, with intact sensation to light touch. Normal finger to nose, heel to shin, and rapid alternating movements. Normal speech. Normal gait. Oriented to person and year. States that [place] the fire [place], but easily re-orients and answers further questions appropriately. Mild upward pronator drift bilaterally.    Skin: Skin is warm and dry. No rash noted. She is not diaphoretic.  Psychiatric: She has a normal mood and affect. Her behavior is normal. Judgment and thought content normal.    ED Course  Procedures (including critical care time) Labs Review Labs Reviewed  URINALYSIS, ROUTINE W REFLEX MICROSCOPIC (NOT AT [facility]) - Abnormal; Notable for the following:    Color, Urine AMBER (*)    Bilirubin Urine SMALL (*)    All other components within normal limits  COMPREHENSIVE  METABOLIC PANEL - Abnormal; Notable for the following:    Potassium 3.3 (*)    Glucose, Bld 114 (*)    ALT 11 (*)    All other components within normal limits  LIPASE, BLOOD  CBC WITH DIFFERENTIAL/PLATELET    Imaging Review Dg Chest 2 View  12/20/2015  CLINICAL DATA:  Cough EXAM: CHEST  2 VIEW COMPARISON:  None. FINDINGS: Heart size within normal limits.  Negative for heart failure. Right hilar density could represent mass or adenopathy. Superimposed pneumonia also possible. Mild linear scarring or atelectasis on the left upper lobe anteriorly. No heart failure or effusion. IMPRESSION: Right hilar density. This may represent mass or adenopathy versus pneumonia. CT chest with contrast recommended for further evaluation Electronically Signed   By: Marlan Palauharles  Clark M.D.   On: 12/20/2015 12:48   Ct Head Wo Contrast  12/20/2015  CLINICAL DATA:  Confusion beginning last night. Sore throat and cough. Body aches and fever. EXAM: CT HEAD WITHOUT CONTRAST TECHNIQUE: Contiguous axial images were obtained from the base of the skull through the vertex without intravenous contrast. COMPARISON:  None. FINDINGS: Mild to moderate generalized atrophy and diffuse white matter changes are present bilaterally. The basal ganglia are intact. The ventricles are proportionate to  the degree of atrophy. No significant extra-axial fluid collection is present. The insular cortex is normal bilaterally. Cerebellar atrophy is present bilaterally as well. The paranasal sinuses and mastoid air cells are clear. The globes and orbits are intact. The calvarium is within normal limits. No significant extracranial soft tissue lesion is present. IMPRESSION: 1. Mild to moderate generalized atrophy and diffuse white matter disease likely reflects the sequela of chronic microvascular ischemia. 2. No acute intracranial abnormality. Electronically Signed   By: Marin Roberts M.D.   On: 12/20/2015 12:30   Ct Chest W Contrast  12/20/2015   CLINICAL DATA:  Follow-up abnormal chest radiograph performed today suggesting possible mass ; personal history of thyroid tumor EXAM: CT CHEST WITH CONTRAST TECHNIQUE: Multidetector CT imaging of the chest was performed during intravenous contrast administration. CONTRAST:  80mL OMNIPAQUE IOHEXOL 300 MG/ML  SOLN COMPARISON:  12/20/2015 FINDINGS: Mediastinum/Nodes: The right lobe of the thyroid is enlarged by an approximate 2.8 cm subtle hypodense mass associated with a few foci of calcification. There is a prevascular lymph node measuring 6 mm in short axis. There is a precarinal lymph node measuring 9 mm in short axis. Although both are prominent they are not pathologic by size criteria. There is no right hilar mass or adenopathy. There is postsurgical change at the gastroesophageal junction. There is a significant hiatal hernia. The esophagus is diffusely distended and fluid-filled. There is significant ectasia but no dilatation of the thoracic aorta. Trace pericardial effusion. Heart size is normal. There is coronary artery calcification. Lungs/Pleura: There is no pleural effusion.  The lungs are clear. Upper abdomen: Images through the upper abdomen show a moderate to large hiatal hernia. There is mild diffuse hepatic steatosis. Musculoskeletal: There are no acute musculoskeletal findings. IMPRESSION: No evidence of right hilar adenopathy or mass. The appearance of the chest radiograph is largely due to significant descending thoracic aortic ectasia. Moderate to large hiatal hernia. The esophagus is fluid-filled and distended. Correlate with known history of right thyroid mass. There is a 2.8 cm solid nodule lower pole thyroid on the right with a few foci of calcification. Ultrasound evaluation would be suggested on less correlation with history alleviates the need for further evaluation. Electronically Signed   By: Esperanza Heir M.D.   On: 12/20/2015 13:49   Mr Laqueta Jean ZO Contrast  12/20/2015  CLINICAL  DATA:  Initial valuation for increased confusion, speech difficulty. EXAM: MRI HEAD WITHOUT AND WITH CONTRAST TECHNIQUE: Multiplanar, multiecho pulse sequences of the brain and surrounding structures were obtained without and with intravenous contrast. CONTRAST:  15 cc of MultiHance. COMPARISON:  Prior CT from earlier the same day. FINDINGS: Advanced cerebral atrophy present. Patchy T2/FLAIR hyperintensity within the periventricular and deep white matter both cerebral hemispheres most consistent with chronic small vessel ischemic disease, fairly mild in nature for patient age. No abnormal foci of restricted diffusion to suggest acute infarct. Gray-white matter for indication maintained. Major intracranial vascular flow voids preserved. No acute or chronic intracranial hemorrhage. No areas of chronic infarction. No mass lesion, midline shift, or mass effect. No abnormal enhancement. Diffuse ventricular prominence related to global parenchymal volume loss without hydrocephalus. No extra-axial fluid collection. Major dural sinuses are grossly patent. Craniocervical junction within normal limits. Visualized upper cervical spine unremarkable. Pituitary gland within normal limits. No acute abnormality about the orbits. Scattered mucosal thickening within the paranasal sinuses. No mastoid effusion. Inner ear structures normal. Bone marrow signal intensity within normal limits. No scalp soft tissue abnormality. IMPRESSION: 1. No  acute intracranial infarct or other abnormality identified. 2. Age advanced cerebral atrophy with mild chronic small vessel ischemic disease. Electronically Signed   By: Rise Mu M.D.   On: 12/20/2015 23:32   I have personally reviewed and evaluated these images and lab results as part of my medical decision-making.   EKG Interpretation   Date/Time:  Friday December 20 2015 11:29:30 EST Ventricular Rate:  68 PR Interval:  164 QRS Duration: 112 QT Interval:  461 QTC Calculation:  490 R Axis:   23 Text Interpretation:  Sinus rhythm Probable left atrial enlargement  Incomplete right bundle branch block Low voltage, precordial leads  Borderline prolonged QT interval Confirmed by ZACKOWSKI  MD, SCOTT ([id number])  on 12/20/2015 11:32:59 AM      MDM   Final diagnoses:  Flu-like symptoms  Confusion  Transient cerebral ischemia, unspecified transient cerebral ischemia type    6:48 PM on arrival the patient is afebrile but has a low-grade temperature to 100. She does not meet sepsis criteria. Given diffuse myalgias, sore throat, dry cough, and clear rhinorrhea, suspect a flulike illness as the cause of her symptoms. She is outside of the window for Tamiflu so will not obtain flu swab at this time.  At the outside hospital CT head was negative. CBC and CMP unremarkable. Urinalysis without indication of UTI. A CT of the chest was Obtained that revealed a thyroid nodule. Patient reports known history of this and has had a biopsy in the past that was negative.  On my assessment the patient is oriented to person and year. She thinks that she is in the fire station, but when told that she is in the hospital easily reorients and remembers that she is here for a stroke workup. No meningismus or neck tenderness and I doubt meningitis. LP is not indicated at this time. Plan is to obtain MRI for further workup.  12:34 AM MRI with chronic microvascular changes but no acute evidence of ischemia. Doubt CVA at this time. The description of the patient's event likely reflects a TIA. Recommend follow up with her primary care doctor early next week for echocardiogram and carotid Dopplers and further risk stratification testing. She should return to the ED immediately for any neurologic deficits. Suspect that the patient's low-grade fevers and constellation of symptoms as above is secondary to a flulike illness. No further workup or card at this time as patient is tolerating oral intake and  hemodynamically stable. She is stable for discharge home with PCP follow-up.       Myran Arcia Ernestina Penna, MD 12/21/15 2725  Derwood Kaplan, MD 12/21/15 [phone]

## 2015-12-20 NOTE — ED Notes (Signed)
[facility] charge nurse made aware of pt transfer for MRI

## 2015-12-20 NOTE — ED Notes (Signed)
[facility] called [facility] and denied patient - [facility] charge nurse contacted [facility]

## 2015-12-21 NOTE — ED Notes (Signed)
Pt stable, ambulatory, states understanding of discharge instructions 

## 2015-12-24 ENCOUNTER — Other Ambulatory Visit: Payer: Self-pay | Admitting: Family Medicine

## 2015-12-24 DIAGNOSIS — R410 Disorientation, unspecified: Secondary | ICD-10-CM

## 2015-12-24 DIAGNOSIS — E041 Nontoxic single thyroid nodule: Secondary | ICD-10-CM

## 2015-12-24 DIAGNOSIS — G458 Other transient cerebral ischemic attacks and related syndromes: Secondary | ICD-10-CM

## 2015-12-24 MED ORDER — GADOBENATE DIMEGLUMINE 529 MG/ML IV SOLN
15.0000 mL | Freq: Once | INTRAVENOUS | Status: AC | PRN
  Administered 2015-12-20: 15 mL via INTRAVENOUS

## 2015-12-30 ENCOUNTER — Other Ambulatory Visit: Payer: Medicare Other

## 2015-12-30 ENCOUNTER — Encounter: Payer: Self-pay | Admitting: *Deleted

## 2015-12-30 ENCOUNTER — Ambulatory Visit
Admission: RE | Admit: 2015-12-30 | Discharge: 2015-12-30 | Disposition: A | Discharge status: Home / Self Care | Payer: Medicare Other | Source: Ambulatory Visit | Attending: Family Medicine | Admitting: Family Medicine

## 2015-12-30 DIAGNOSIS — R410 Disorientation, unspecified: Secondary | ICD-10-CM

## 2015-12-30 DIAGNOSIS — E041 Nontoxic single thyroid nodule: Secondary | ICD-10-CM

## 2015-12-30 DIAGNOSIS — G458 Other transient cerebral ischemic attacks and related syndromes: Secondary | ICD-10-CM

## 2015-12-31 ENCOUNTER — Encounter: Payer: Self-pay | Admitting: Neurology

## 2015-12-31 ENCOUNTER — Ambulatory Visit (INDEPENDENT_AMBULATORY_CARE_PROVIDER_SITE_OTHER): Payer: Medicare Other | Admitting: Neurology

## 2015-12-31 VITALS — BP 131/78 | HR 56 | Ht 67.0 in | Wt 179.0 lb

## 2015-12-31 DIAGNOSIS — R413 Other amnesia: Secondary | ICD-10-CM | POA: Diagnosis not present

## 2015-12-31 DIAGNOSIS — E538 Deficiency of other specified B group vitamins: Secondary | ICD-10-CM

## 2015-12-31 DIAGNOSIS — R404 Transient alteration of awareness: Secondary | ICD-10-CM | POA: Diagnosis not present

## 2015-12-31 DIAGNOSIS — R269 Unspecified abnormalities of gait and mobility: Secondary | ICD-10-CM

## 2015-12-31 HISTORY — DX: Other amnesia: R41.3

## 2015-12-31 HISTORY — DX: Unspecified abnormalities of gait and mobility: R26.9

## 2015-12-31 NOTE — Patient Instructions (Signed)
Fall Prevention in the Home  Falls can cause injuries and can affect people from all age groups. There are many simple things that you can do to make your home safe and to help prevent falls. WHAT CAN I DO ON THE OUTSIDE OF MY HOME?  Regularly repair the edges of walkways and driveways and fix any cracks.  Remove high doorway thresholds.  Trim any shrubbery on the main path into your home.  Use bright outdoor lighting.  Clear walkways of debris and clutter, including tools and rocks.  Regularly check that handrails are securely fastened and in good repair. Both sides of any steps should have handrails.  Install guardrails along the edges of any raised decks or porches.  Have leaves, snow, and ice cleared regularly.  Use sand or salt on walkways during winter months.  In the garage, clean up any spills right away, including grease or oil spills. WHAT CAN I DO IN THE BATHROOM?  Use night lights.  Install grab bars by the toilet and in the tub and shower. Do not use towel bars as grab bars.  Use non-skid mats or decals on the floor of the tub or shower.  If you need to sit down while you are in the shower, use a plastic, non-slip stool..  Keep the floor dry. Immediately clean up any water that spills on the floor.  Remove soap buildup in the tub or shower on a regular basis.  Attach bath mats securely with double-sided non-slip rug tape.  Remove throw rugs and other tripping hazards from the floor. WHAT CAN I DO IN THE BEDROOM?  Use night lights.  Make sure that a bedside light is easy to reach.  Do not use oversized bedding that drapes onto the floor.  Have a firm chair that has side arms to use for getting dressed.  Remove throw rugs and other tripping hazards from the floor. WHAT CAN I DO IN THE KITCHEN?   Clean up any spills right away.  Avoid walking on wet floors.  Place frequently used items in easy-to-reach places.  If you need to reach for something  above you, use a sturdy step stool that has a grab bar.  Keep electrical cables out of the way.  Do not use floor polish or wax that makes floors slippery. If you have to use wax, make sure that it is non-skid floor wax.  Remove throw rugs and other tripping hazards from the floor. WHAT CAN I DO IN THE STAIRWAYS?  Do not leave any items on the stairs.  Make sure that there are handrails on both sides of the stairs. Fix handrails that are broken or loose. Make sure that handrails are as long as the stairways.  Check any carpeting to make sure that it is firmly attached to the stairs. Fix any carpet that is loose or worn.  Avoid having throw rugs at the top or bottom of stairways, or secure the rugs with carpet tape to prevent them from moving.  Make sure that you have a light switch at the top of the stairs and the bottom of the stairs. If you do not have them, have them installed. WHAT ARE SOME OTHER FALL PREVENTION TIPS?  Wear closed-toe shoes that fit well and support your feet. Wear shoes that have rubber soles or low heels.  When you use a stepladder, make sure that it is completely opened and that the sides are firmly locked. Have someone hold the ladder while you   are using it. Do not climb a closed stepladder.  Add color or contrast paint or tape to grab bars and handrails in your home. Place contrasting color strips on the first and last steps.  Use mobility aids as needed, such as canes, walkers, scooters, and crutches.  Turn on lights if it is dark. Replace any light bulbs that burn out.  Set up furniture so that there are clear paths. Keep the furniture in the same spot.  Fix any uneven floor surfaces.  Choose a carpet design that does not hide the edge of steps of a stairway.  Be aware of any and all pets.  Review your medicines with your healthcare provider. Some medicines can cause dizziness or changes in blood pressure, which increase your risk of falling. Talk  with your health care provider about other ways that you can decrease your risk of falls. This may include working with a physical therapist or trainer to improve your strength, balance, and endurance.   This information is not intended to replace advice given to you by your health care provider. Make sure you discuss any questions you have with your health care provider.   Document Released: 09/18/2002 Document Revised: 02/12/2015 Document Reviewed: 11/02/2014 [employer] Interactive Patient Education 2016 [employer]  

## 2015-12-31 NOTE — Progress Notes (Signed)
Reason for visit: Confusion  Referring physician: Dr. Bayard Males is a 75 y.o. female  History of present illness:  Ms. Peedin is a 75 year old right-handed white female with a history of a memory disturbance that has been present for least 2 years. The patient has had some issues with short-term memory, she has been unable to manage her finances over the last 2 years, her husband has taken over this. She still operates a Librarian, academic, she does have some issues with directions at times. She indicates that she is able to keep up with her medications and appointments without difficulty. She does misplace things about the house on occasion. She also has developed a slight balance issue, she has been through physical therapy for balance training, a cane was recommended, but she has not been using this. She denies any focal numbness or weakness of the face, arms, or legs. She was seen through the emergency him on 12/20/2015. The patient had 3 or 4 days of low-grade fever, sore throat, occasional cough prior to the ER visit. The patient was found by her husband after midnight in the garage confused. The patient had difficulty recognizing the house is being her own, she had no definite focal features to her appearance. The patient went to the hospital and underwent MRI evaluation of the brain that did not show an acute stroke, the patient does have a moderate level of small vessel disease and diffuse cortical atrophy with hydrocephalus ex vacuo. The patient was placed on aspirin, she is sent to this office for an evaluation. She has been set up for a carotid Doppler study that was relatively unremarkable, a 2-D echocardiogram is pending.  Past Medical History  Diagnosis Date  . Depression   . Diabetes mellitus without complication (HCC)   . GERD (gastroesophageal reflux disease)   . Obesity   . Hypertension   . Hyperlipidemia   . Kidney stones   . Vitamin D deficiency   .  Genital herpes   . Colon polyps   . Vitamin D deficiency   . Hyperparathyroidism (HCC)     Dr. Talmage Nap  . Memory disorder 12/31/2015  . Abnormality of gait 12/31/2015    Past Surgical History  Procedure Laterality Date  . Intestinal malrotation repair    . Nissen fundoplication      for huge hiatal hernia 07/2008  . Thyroid tumor      benign    Family History  Problem Relation Age of Onset  . Sleep apnea Father   . Alcohol abuse Father   . Stroke Father   . Dementia Mother   . Parkinsonism Sister     Social history:  reports that she has never smoked. She does not have any smokeless tobacco history on file. She reports that she does not drink alcohol or use illicit drugs.  Medications:  Prior to Admission medications   Medication Sig Start Date End Date Taking? Authorizing Provider  aspirin 325 MG tablet Take 325 mg by mouth daily.    Historical Provider, MD  atorvastatin (LIPITOR) 10 MG tablet Take 10 mg by mouth daily.    Historical Provider, MD  buPROPion (WELLBUTRIN XL) 150 MG 24 hr tablet Take 150 mg by mouth daily.  07/22/15   Historical Provider, MD  Cholecalciferol (VITAMIN D PO) Take 1 tablet by mouth daily. 1000units    Historical Provider, MD  esomeprazole (NEXIUM) 40 MG capsule Take 40 mg by mouth 2 (two) times daily  before a meal.     Historical Provider, MD  FLUoxetine (PROZAC) 20 MG capsule Take 20 mg by mouth daily.    Historical Provider, MD  ibuprofen (ADVIL,MOTRIN) 200 MG tablet Take 200 mg by mouth every 6 (six) hours as needed.    Historical Provider, MD     No Known Allergies  ROS:  Out of a complete 14 system review of symptoms, the patient complains only of the following symptoms, and all other reviewed systems are negative.  Fatigue Memory loss, confusion Decreased energy Restless legs  Blood pressure 131/78, pulse 56, height 5\' 7"  (1.702 m), weight 179 lb (81.194 kg).  Physical Exam  General: The patient is alert and cooperative at the  time of the examination.  Eyes: Pupils are equal, round, and reactive to light. Discs are flat bilaterally.  Neck: The neck is supple, no carotid bruits are noted.  Respiratory: The respiratory examination is clear.  Cardiovascular: The cardiovascular examination reveals a regular rate and rhythm, no obvious murmurs or rubs are noted.  Skin: Extremities are without significant edema.  Neurologic Exam  Mental status: The patient is alert and oriented x 2 at the time of the examination (not oriented to date). The Mini-Mental Status Examination done today shows a total score 24/30. The patient is able to name 5 animals in 30 seconds.  Cranial nerves: Facial symmetry is present. There is good sensation of the face to pinprick and soft touch bilaterally. The strength of the facial muscles and the muscles to head turning and shoulder shrug are normal bilaterally. Speech is well enunciated, no aphasia or dysarthria is noted. Extraocular movements are full. Visual fields are full. The tongue is midline, and the patient has symmetric elevation of the soft palate. No obvious hearing deficits are noted.  Motor: The motor testing reveals 5 over 5 strength of all 4 extremities. Good symmetric motor tone is noted throughout.  Sensory: Sensory testing is intact to pinprick, soft touch, vibration sensation, and position sense on all 4 extremities, with exception of decrease in position sensation on the left arm and both legs. No evidence of extinction is noted.  Coordination: Cerebellar testing reveals good finger-nose-finger and heel-to-shin bilaterally.  Gait and station: Gait is slightly wide-based, unsteady. Tandem gait is unsteady. Romberg is negative. No drift is seen.  Reflexes: Deep tendon reflexes are symmetric and normal bilaterally. Toes are downgoing bilaterally.   MRI brain 12/20/15:  IMPRESSION: 1. No acute intracranial infarct or other abnormality identified. 2. Age advanced cerebral  atrophy with mild chronic small vessel ischemic disease.  * MRI scan images were reviewed online. I agree with the written report.    Carotid doppler study 12/30/15:  IMPRESSION: 1. Bilateral carotid bifurcation and proximal ICA plaque resulting in less than 50% diameter stenosis. The exam does not exclude plaque ulceration or embolization. Continued surveillance recommended.    Assessment/Plan:  1. Transient confusion  2. Memory disorder, dementia  3. Small vessel disease by MRI  The patient likely did not have a TIA or stroke. The patient has a pre-existing history of dementia, she had an intercurrent illness with low-grade fever that resulted in a confusional state. The patient will be sent for blood work today, we will consider medication for memory the future. The patient does have small vessel disease by MRI, she is to stay on aspirin. A 2-D echocardiogram is pending. She will follow-up in about 5 months, we will consider addition of a medication such as Aricept for memory. The  patient does have a history of diabetes, a blood sugar was not checked during the period of confusion. I have indicated to the husband that this is to be done if another confusional episode is noted.  Marlan Palau MD 12/31/2015 7:54 PM  [facility] [address] [place], [place] [address]  Phone [phone] Fax [phone]

## 2016-01-03 ENCOUNTER — Telehealth: Payer: Self-pay | Admitting: *Deleted

## 2016-01-03 LAB — SEDIMENTATION RATE: Sed Rate: 2 mm/hr (ref 0–40)

## 2016-01-03 LAB — RPR: RPR: NONREACTIVE

## 2016-01-03 LAB — VITAMIN B12: VITAMIN B 12: 305 pg/mL (ref 211–946)

## 2016-01-03 LAB — METHYLMALONIC ACID, SERUM: Methylmalonic Acid: 371 nmol/L (ref 0–378)

## 2016-01-03 NOTE — Telephone Encounter (Signed)
LVM requesting patient call back Monday for lab results. Left number.

## 2016-01-06 ENCOUNTER — Telehealth: Payer: Self-pay | Admitting: *Deleted

## 2016-01-06 NOTE — Telephone Encounter (Signed)
-----   Message from York Spanielharles K Willis, MD sent at 01/03/2016  1:03 PM EDT -----  The blood work results are unremarkable. Please call the patient.   ----- Message -----    From: [facility] Lab Results In Interface    Sent: 01/01/2016   7:42 AM      To: York Spanielharles K Willis, MD

## 2016-01-06 NOTE — Telephone Encounter (Signed)
Received call from husband re: wife's lab results. At same time another RN from this office was speaking to patient, so this RN ended call with husband, at his agreement.

## 2016-01-06 NOTE — Telephone Encounter (Signed)
Spoke to pt and husband and let them know lab work ok.  Keep f/u appt.  He verbalized understanding.

## 2016-01-07 ENCOUNTER — Ambulatory Visit: Payer: Medicare Other | Attending: Family Medicine | Admitting: Physical Therapy

## 2016-01-07 DIAGNOSIS — R296 Repeated falls: Secondary | ICD-10-CM | POA: Diagnosis present

## 2016-01-07 DIAGNOSIS — R2681 Unsteadiness on feet: Secondary | ICD-10-CM | POA: Diagnosis present

## 2016-01-07 DIAGNOSIS — M6281 Muscle weakness (generalized): Secondary | ICD-10-CM | POA: Diagnosis present

## 2016-01-07 DIAGNOSIS — R262 Difficulty in walking, not elsewhere classified: Secondary | ICD-10-CM | POA: Insufficient documentation

## 2016-01-07 DIAGNOSIS — R2689 Other abnormalities of gait and mobility: Secondary | ICD-10-CM | POA: Diagnosis not present

## 2016-01-07 NOTE — Therapy (Signed)
[facility] [address] [place], [place], [address] Phone: [phone]   Fax:  [phone]  Physical Therapy Evaluation  Patient Details  Name: Christine Webster MRN: [id number] Date of Birth: 1940/10/18 Referring Provider: Dr. Corliss Blacker   Encounter Date: 01/07/2016      PT End of Session - 01/07/16 1327    Visit Number 1   Number of Visits 10   Date for PT Re-Evaluation 03/09/16   Authorization Type G codes; KX   PT Start Time 1230   PT Stop Time 1330   PT Time Calculation (min) 60 min   Activity Tolerance Other (comment)      Past Medical History  Diagnosis Date  . Depression   . Diabetes mellitus without complication (HCC)   . GERD (gastroesophageal reflux disease)   . Obesity   . Hypertension   . Hyperlipidemia   . Kidney stones   . Vitamin D deficiency   . Genital herpes   . Colon polyps   . Vitamin D deficiency   . Hyperparathyroidism (HCC)     Dr. Talmage Nap  . Memory disorder 12/31/2015  . Abnormality of gait 12/31/2015    Past Surgical History  Procedure Laterality Date  . Intestinal malrotation repair    . Nissen fundoplication      for huge hiatal hernia 07/2008  . Thyroid tumor      benign    There were no vitals filed for this visit.  Visit Diagnosis:  Abnormality of gait due to impairment of balance - Plan: PT plan of care cert/re-cert  Unsteadiness on feet - Plan: PT plan of care cert/re-cert  Difficulty in walking, not elsewhere classified - Plan: PT plan of care cert/re-cert  Muscle weakness (generalized) - Plan: PT plan of care cert/re-cert      Subjective Assessment - 01/07/16 1233    Subjective 2 weeks ago disoriented spent all day in ER, ran tests for strokes;  all negative, next follow up on Monday.  3 spells where couldn't stand up per husband.  Felt woozy while walking in the hall, while on edge of bed and while in shower.   Fell to floor when she didn't turn enough  to sit on edge of bed.  Has a walking stick but too embarrassed to use.  Husband reports a forward propulsion gait, bent forward.    Patient is accompained by: Family member   How long can you sit comfortably? no problem    How long can you stand comfortably? wash dishes10 minutes   How long can you walk comfortably? end of driveway   Diagnostic tests MRI;  U/S of carotid, EKG    Patient Stated Goals build my strength, less loss of balance   Currently in Pain? No/denies            [facility] PT Assessment - 01/07/16 0001    Assessment   Medical Diagnosis unsteadiness of gait   Referring Provider Dr. Corliss Blacker    Onset Date/Surgical Date --  2 weeks   Hand Dominance Right   Next MD Visit 4/3   Prior Therapy 1 year ago   Precautions   Precautions Fall   Restrictions   Weight Bearing Restrictions No   Balance Screen   Has the patient fallen in the past 6 months Yes   How many times? 3-4  walking in the park, at church, home   Has the patient had a decrease in activity level because of a  fear of falling?  Yes   Is the patient reluctant to leave their home because of a fear of falling?  No   Home Environment   Living Environment Private residence   Living Arrangements Spouse/significant other   Type of Home House   Home Access Stairs to enter   Entrance Stairs-Number of Steps 2-3   Entrance Stairs-Rails Right   Home Layout One level   Additional Comments church steps 25 almost fell coming down   Prior Function   Level of Independence Independent with basic ADLs  has a shower bench, dresses self    Vocation Retired   Leisure meet friends every Fri night for supper.  Bible study   Cognition   Attention Focused   Awareness Impaired   Awareness Impairment --  tries to sit down too far from the chair   Executive Function --  some difficulty following instructions for MMT   Posture/Postural Control   Posture/Postural Control Postural limitations   Postural Limitations Rounded  Shoulders;Forward head;Decreased thoracic kyphosis   ROM / Strength   AROM / PROM / Strength AROM;Strength   AROM   Overall AROM Comments UE and LE WFLS   Strength   Overall Strength Comments LEs grossly 4/5   Flexibility   Soft Tissue Assessment /Muscle Length yes   Hamstrings yes'   Quadriceps yes   Ambulation/Gait   Ambulation/Gait Yes   Ambulation/Gait Assistance 4: Min assist   Ambulation/Gait Assistance Details patient walks in/out of the clinic holding hands with her husband who states she "walks better this way."     Assistive device --  none   Standardized Balance Assessment   Standardized Balance Assessment Five Times Sit to Stand   Five times sit to stand comments  37 sec   Berg Balance Test   Sit to Stand Able to stand  independently using hands   Standing Unsupported Able to stand safely 2 minutes   Sitting with Back Unsupported but Feet Supported on Floor or Stool Able to sit safely and securely 2 minutes   Stand to Sit Controls descent by using hands   Transfers Able to transfer safely, definite need of hands   Standing Unsupported with Eyes Closed Able to stand 10 seconds with supervision   Standing Ubsupported with Feet Together Able to place feet together independently and stand for 1 minute with supervision   From Standing, Reach Forward with Outstretched Arm Can reach forward >5 cm safely (2")   From Standing Position, Pick up Object from Floor Able to pick up shoe, needs supervision   From Standing Position, Turn to Look Behind Over each Shoulder Looks behind from both sides and weight shifts well   Turn 360 Degrees Able to turn 360 degrees safely but slowly   Standing Unsupported, Alternately Place Feet on Step/Stool Needs assistance to keep from falling or unable to try   Standing Unsupported, One Foot in [employer] balance while stepping or standing   Standing on One Leg Unable to try or needs assist to prevent fall   Total Score 34   Timed Up and Go Test    Manual TUG (seconds) 25.3                           PT Education - 01/07/16 1327    Education provided Yes   Education Details sit to stand;  recommendation for rolling walker   Person(s) Educated Patient   Methods Explanation;Handout  Comprehension Verbalized understanding          PT Short Term Goals - 01/07/16 2207    PT SHORT TERM GOAL #1   Title The patient will use rolling walker at home and the community for safety secondary to high risk for falls   02/04/16   Time 4   Period Weeks   Status New   PT SHORT TERM GOAL #2   Title BERG balance score improved from 34/56 to 38/56 indicating improved standing and gait safety   Time 4   Period Weeks   Status On-going   PT SHORT TERM GOAL #3   Title The patient will be able to stand for 15 min for light meal prep   Time 4   Period Weeks   Status On-going   PT SHORT TERM GOAL #4   Title TUG test improved to 20 sec indicating improved gait speed   Time 4   Period Weeks   Status New           PT Long Term Goals - 01/07/16 2211    PT LONG TERM GOAL #1   Title The patient will be independent in HEP for strengthening of LEs and balance training   03/03/16   Time 8   Period Weeks   Status On-going   PT LONG TERM GOAL #2   Title The patient will have gross LE strength 4+/5 needed to descend steps safely at home and church   Time 8   Period Weeks   Status On-going   PT LONG TERM GOAL #3   Title The patient will have improved TUG score to 15 sec indicating improved gait speed and LE strength   Time 8   Period Weeks   Status On-going   PT LONG TERM GOAL #4   Title Sit to stand 5x improved to 27 sec indicating improved LE strength and mobility   Time 8   Period Weeks   Status On-going   PT LONG TERM GOAL #5   Title BERG balance test improved to 40/56 indicating improved dynamic balance with narrow base of support   Time 8   Period Weeks   Status On-going               Plan - 01/07/16  2147    Clinical Impression Statement The patient is of moderate complexity evaluation.  She presents with 2 week history of sudden disorientation and worsening of balance and fall.  She does have a history of falls (3-4 x in the past 6 months) but the patient and her husband report a recent change /worsening.  She has numerous scans and tests over the past 2 weeks with no explanation thus far but will receive remaining test results on Mon with Dr. Zachery DauerBarnes.  Her recent brain scan showed "no acute infarct, age advanced cerebral atrophy with mild chronic small vessel ischemic disease."  She ambulates without an assistive device  although her husband states she "doesn't better when holding hands."  She has significant deficits in BERG balance 34/56 putting her in the high risk of falls category.  Timed up and Go test 25.3 (norm 12 sec), Sit to stand 5x 37 sec.  Recommend full time use of a rolling walker secondary to high risk of falls.  She has 3 episodes during evaluation requiring max assist to stabilize.   She also complains of weakness especially with going down the steps at home and especially at church.   She would  benefit from PT to address this deficit as well .     Pt will benefit from skilled therapeutic intervention in order to improve on the following deficits Decreased balance;Abnormal gait;Decreased coordination;Decreased endurance;Decreased mobility;Decreased strength;Decreased safety awareness;Difficulty walking   Rehab Potential Good   Clinical Impairments Affecting Rehab Potential high risk of falls;  use gait belt   PT Frequency 2x / week   PT Duration 8 weeks   PT Treatment/Interventions ADLs/Self Care Home Management;DME Instruction;Gait training;Stair training;Functional mobility training;Therapeutic activities;Iontophoresis 4mg /ml Dexamethasone;Patient/family education;Neuromuscular re-education;Balance training   PT Next Visit Plan use gait belt;  seated LE strengthening for HEP;  psoas  stretch; neuromuscular re-education; weight shifting    PT Home Exercise Plan sit to stand from high chair   Recommended Other Services rolling walker full time          G-Codes - 31-Jan-2016 Mar 08, 2216    Functional Assessment Tool Used BERG; TUG; clinical judgement   Functional Limitation Mobility: Walking and moving around   Mobility: Walking and Moving Around Current Status [phone]) At least 60 percent but less than 80 percent impaired, limited or restricted   Mobility: Walking and Moving Around Goal Status [phone]) At least 40 percent but less than 60 percent impaired, limited or restricted       Problem List Patient Active Problem List   Diagnosis Date Noted  . Transient alteration of awareness 12/31/2015  . Memory disorder 12/31/2015  . Abnormality of gait 12/31/2015    Vivien Presto 01/31/16, 10:20 PM  [facility] [address] [place], [place], [address] Phone: [phone]   Fax:  [phone]  Name: Christine Webster MRN: [id number] Date of Birth: [birth year]   [name], PT 31-Jan-2016 10:21 PM Phone: [phone] Fax: [phone]

## 2016-01-07 NOTE — Patient Instructions (Signed)
   Recommend rolling walker full-time.  Get order from MD.  Practice sit to stand from a higher surface.         Lavinia SharpsStacy Simpson PT [facility] [address], [address] [place], [place] [address] Phone # [phone] Fax [phone]

## 2016-01-08 ENCOUNTER — Ambulatory Visit: Payer: Medicare Other | Admitting: Podiatry

## 2016-01-10 ENCOUNTER — Ambulatory Visit: Payer: Medicare Other | Admitting: Physical Therapy

## 2016-01-10 DIAGNOSIS — R262 Difficulty in walking, not elsewhere classified: Secondary | ICD-10-CM

## 2016-01-10 DIAGNOSIS — Z9181 History of falling: Secondary | ICD-10-CM

## 2016-01-10 DIAGNOSIS — R2689 Other abnormalities of gait and mobility: Secondary | ICD-10-CM | POA: Diagnosis not present

## 2016-01-10 DIAGNOSIS — R2681 Unsteadiness on feet: Secondary | ICD-10-CM

## 2016-01-10 DIAGNOSIS — M6281 Muscle weakness (generalized): Secondary | ICD-10-CM

## 2016-01-10 NOTE — Patient Instructions (Signed)
Christine Webster PT [facility] [address], [address] [place],  [address] Phone # [phone] Fax [phone]    

## 2016-01-10 NOTE — Therapy (Signed)
[facility] [address], [address] [place], [place], [address] Phone: [phone]   Fax:  [phone]  Physical Therapy Treatment  Patient Details  Name: Christine Webster MRN: [id number] Date of Birth: Aug 13, 1941 Referring Provider: Dr. Corliss BlackerMcNeill   Encounter Date: 01/10/2016      PT End of Session - 01/10/16 1221    Visit Number 2   Number of Visits 10   Date for PT Re-Evaluation 03/03/16   Authorization Type G codes; KX   PT Start Time 0930   PT Stop Time 1015   PT Time Calculation (min) 45 min   Activity Tolerance Patient limited by fatigue   Behavior During Therapy --  difficulty following  directions      Past Medical History  Diagnosis Date  . Depression   . Diabetes mellitus without complication (HCC)   . GERD (gastroesophageal reflux disease)   . Obesity   . Hypertension   . Hyperlipidemia   . Kidney stones   . Vitamin D deficiency   . Genital herpes   . Colon polyps   . Vitamin D deficiency   . Hyperparathyroidism (HCC)     Dr. Talmage NapBalan  . Memory disorder 12/31/2015  . Abnormality of gait 12/31/2015    Past Surgical History  Procedure Laterality Date  . Intestinal malrotation repair    . Nissen fundoplication      for huge hiatal hernia 07/2008  . Thyroid tumor      benign    There were no vitals filed for this visit.  Visit Diagnosis:  Abnormality of gait due to impairment of balance  Unsteadiness on feet  Difficulty in walking, not elsewhere classified  Muscle weakness (generalized)  Falls infrequently      Subjective Assessment - 01/10/16 0936    Subjective No "spells" in the last few days.  No complaints of pain.  Husband and patient deny any "close calls" with falling.     Patient is accompained by: Family member   Currently in Pain? No/denies                         [facility] Adult PT Treatment/Exercise - 01/10/16 0001    Knee/Hip Exercises: Aerobic   Nustep L1  5 min   Knee/Hip Exercises: Standing   Other Standing Knee Exercises wall pull aways 12x   Other Standing Knee Exercises UE wall slides with gluteal squeeze 10x;  march on wall 8x   Knee/Hip Exercises: Seated   Long Arc Quad Right;Left;1 set;10 reps  red band   Other Seated Knee/Hip Exercises hip abd/ER seated clams 10x R/L   Marching Right;Left;1 set;10 reps   Marching Limitations red band   Sit to [employer] 2 sets;5 reps;without UE support  high table                PT Education - 01/10/16 1221    Education provided Yes   Education Details red band LE seated   Person(s) Educated Patient;Spouse   Methods Explanation;Demonstration;Handout   Comprehension Verbalized understanding;Returned demonstration          PT Short Term Goals - 01/10/16 1230    PT SHORT TERM GOAL #1   Title The patient will use rolling walker at home and the community for safety secondary to high risk for falls   02/04/16   Time 4   Period Weeks   Status On-going   PT SHORT TERM GOAL #2   Title  BERG balance score improved from 34/56 to 38/56 indicating improved standing and gait safety   Time 4   Period Weeks   Status On-going   PT SHORT TERM GOAL #3   Title The patient will be able to stand for 15 min for light meal prep   Time 4   Period Weeks   Status On-going   PT SHORT TERM GOAL #4   Title TUG test improved to 20 sec indicating improved gait speed   Time 4   Period Weeks   Status On-going           PT Long Term Goals - 01/10/16 1231    PT LONG TERM GOAL #1   Title The patient will be independent in HEP for strengthening of LEs and balance training   03/03/16   Time 8   Period Weeks   Status On-going   PT LONG TERM GOAL #2   Title The patient will have gross LE strength 4+/5 needed to descend steps safely at home and church   Time 8   Period Weeks   Status On-going   PT LONG TERM GOAL #3   Title The patient will have improved TUG score to 15 sec indicating improved gait speed  and LE strength   Time 8   Period Weeks   Status On-going   PT LONG TERM GOAL #4   Title Sit to stand 5x improved to 27 sec indicating improved LE strength and mobility   Time 8   Period Weeks   Status On-going   PT LONG TERM GOAL #5   Title BERG balance test improved to 40/56 indicating improved dynamic balance with narrow base of support   Time 8   Period Weeks   Status On-going               Plan - 01/10/16 1222    Clinical Impression Statement The patient is present with her husband.  He asks about the necessity of a walker for a patient, asks if holding hands if enough support.  Reviewed her high risk of falls and recommended 2 hand support (with a rolling walker) would be optimal.  The patient denies pain or excessive fatigue with ex today although at times she has difficulty following ex instructions.   In standing she requires mod assist 2x and does not seem to realize she is leaning heavily to the side.  Her husband states, "this is one of her spells."  Close supervison and gait belt used for safety.     PT Next Visit Plan use gait belt;  seated LE strengthening for HEP;  psoas stretch; neuromuscular re-education; weight shifting ;  Nu-Step        Problem List Patient Active Problem List   Diagnosis Date Noted  . Transient alteration of awareness 12/31/2015  . Memory disorder 12/31/2015  . Abnormality of gait 12/31/2015    Vivien Presto 01/10/2016, 12:32 PM   [facility] [address], [address] [place], [place], [address] Phone: [phone]   Fax:  [phone]  Name: Christine Webster MRN: [id number] Date of Birth: [birth year]    [name], PT 01/10/2016 12:32 PM Phone: [phone] Fax: [phone]

## 2016-01-14 ENCOUNTER — Ambulatory Visit: Payer: Medicare Other | Attending: Family Medicine | Admitting: Physical Therapy

## 2016-01-14 DIAGNOSIS — R2689 Other abnormalities of gait and mobility: Secondary | ICD-10-CM | POA: Diagnosis present

## 2016-01-14 DIAGNOSIS — M6281 Muscle weakness (generalized): Secondary | ICD-10-CM | POA: Insufficient documentation

## 2016-01-14 DIAGNOSIS — R296 Repeated falls: Secondary | ICD-10-CM | POA: Diagnosis present

## 2016-01-14 DIAGNOSIS — R262 Difficulty in walking, not elsewhere classified: Secondary | ICD-10-CM | POA: Diagnosis present

## 2016-01-14 DIAGNOSIS — R2681 Unsteadiness on feet: Secondary | ICD-10-CM | POA: Insufficient documentation

## 2016-01-14 NOTE — Therapy (Signed)
[facility] [address], [address] [place], [place], [address] Phone: [phone]   Fax:  [phone]  Physical Therapy Treatment  Patient Details  Name: Christine Webster MRN: [id number] Date of Birth: 01/05/1941 Referring Provider: Dr. Corliss Blacker   Encounter Date: 01/14/2016      PT End of Session - 01/14/16 1142    Visit Number 3   Number of Visits 10   Date for PT Re-Evaluation 03/15/16   Authorization Type G codes; KX   PT Start Time 1015   PT Stop Time 1057   PT Time Calculation (min) 42 min   Activity Tolerance Patient limited by fatigue  difficulty following directions      Past Medical History  Diagnosis Date  . Depression   . Diabetes mellitus without complication (HCC)   . GERD (gastroesophageal reflux disease)   . Obesity   . Hypertension   . Hyperlipidemia   . Kidney stones   . Vitamin D deficiency   . Genital herpes   . Colon polyps   . Vitamin D deficiency   . Hyperparathyroidism (HCC)     Dr. Talmage Nap  . Memory disorder 12/31/2015  . Abnormality of gait 12/31/2015    Past Surgical History  Procedure Laterality Date  . Intestinal malrotation repair    . Nissen fundoplication      for huge hiatal hernia 07/2008  . Thyroid tumor      benign    There were no vitals filed for this visit.  Visit Diagnosis:  Muscle weakness (generalized)  Other abnormalities of gait and mobility      Subjective Assessment - 01/14/16 1020    Subjective Saw the doctor yesterday and got a "good report."  Husband states she will have a biopsy on thyroid.  Fell twice on Friday when she stubbed her toe and fell on the stairs bruising her knees and also fell into the bathtub when she lost her balance.   Husband states they bought a walker (but not using today.)  Husband asks when she can return to driving.     Patient is accompained by: Family member                         [facility] Adult PT  Treatment/Exercise - 01/14/16 0001    Lumbar Exercises: Seated   Other Seated Lumbar Exercises Core sets pushing on foam roll 1 min   Other Seated Lumbar Exercises abdominal brace 2# plyo ball:  hip to hip, chops, golf swings and [employer] 1 min each   Knee/Hip Exercises: Aerobic   Nustep L1 7 min   Knee/Hip Exercises: Standing   Hip Abduction Left;Right;10 reps  alternating   Hip Extension Right;Left;10 reps  alternating   Other Standing Knee Exercises psoas door stretch with UE up frame and reach across 5x R/L   Other Standing Knee Exercises side stepping at counter 8 feet x2   Knee/Hip Exercises: Seated   Sit to Sand 1 set;10 reps;without UE support  Airex and 1 pillow                  PT Short Term Goals - 01/14/16 1717    PT SHORT TERM GOAL #1   Title The patient will use rolling walker at home and the community for safety secondary to high risk for falls   02/04/16   Time 4   Period Weeks   Status On-going   PT SHORT  TERM GOAL #2   Title BERG balance score improved from 34/56 to 38/56 indicating improved standing and gait safety   Time 4   Period Weeks   Status On-going   PT SHORT TERM GOAL #3   Title The patient will be able to stand for 15 min for light meal prep   Time 4   Period Weeks   Status On-going   PT SHORT TERM GOAL #4   Title TUG test improved to 20 sec indicating improved gait speed   Time 4   Period Weeks   Status On-going           PT Long Term Goals - 01/14/16 1718    PT LONG TERM GOAL #1   Title The patient will be independent in HEP for strengthening of LEs and balance training   03/03/16   Time 8   Period Weeks   Status On-going   PT LONG TERM GOAL #2   Title The patient will have gross LE strength 4+/5 needed to descend steps safely at home and church   Time 8   Period Weeks   Status On-going   PT LONG TERM GOAL #3   Title The patient will have improved TUG score to 15 sec indicating improved gait speed and LE strength    Time 8   Period Weeks   Status On-going   PT LONG TERM GOAL #4   Title Sit to stand 5x improved to 27 sec indicating improved LE strength and mobility   Time 8   Period Weeks   Status On-going   PT LONG TERM GOAL #5   Title BERG balance test improved to 40/56 indicating improved dynamic balance with narrow base of support   Time 8   Period Weeks   Status On-going               Plan - 01/14/16 1711    Clinical Impression Statement The patient presents with her husband who reports 2 recent falls.  He reports he purchased a rolling walker for patient but she presents to therapy without it.  Encouraged them to bring to the next visit for height adjustment and instruction in use.  The patient's husband also asks about her return to driving which seems ill timed given her recent falls and "spells".  Discussed that physically she does not have the coordination or balance to safely get in/out of the car.   The patient also has great difficulty following simple directions with exercises in the clinic and often needs physical guiding to move her extremities.  Her decreased cognition will limit her progression with therapy.  Close supervision and a gait belt used for safety.     PT Next Visit Plan use gait belt;  seated LE strengthening for HEP; neuromuscular re-education; weight shifting ;  Nu-Step        Problem List Patient Active Problem List   Diagnosis Date Noted  . Transient alteration of awareness 12/31/2015  . Memory disorder 12/31/2015  . Abnormality of gait 12/31/2015    Vivien PrestoSimpson, Ellyce Lafevers C 01/14/2016, 5:20 PM  [facility] [address], [address] [place], [place], [address] Phone: [phone]   Fax:  [phone]  Name: Yevonne PaxStella M Bettenhausen MRN: [id number] Date of Birth: [birth year]    Lavinia SharpsStacy Taya Ashbaugh, PT 01/14/2016 5:20 PM Phone: [phone] Fax: [phone]

## 2016-01-15 ENCOUNTER — Telehealth: Payer: Self-pay | Admitting: Neurology

## 2016-01-15 NOTE — Telephone Encounter (Signed)
Blood work that was recently done by Dr. Zachery DauerBarnes included a hemoglobin A1c of 6.1, white count 5.7, hemoglobin 12.9, hematocrit 38.7, platelets of 224. BUN 10, creatinine of 0.75, sodium 138, potassium 4.3, calcium 10.9 which is slightly high, total protein 6.8, albumin 4.1 liver enzymes were unremarkable. vitamin B12 level is low normal at 212. TSH of 4.53 and vitamin D level 29.8 parathyroid hormone, intact of 74 which is slightly high, upper range is 65.

## 2016-01-17 ENCOUNTER — Ambulatory Visit: Payer: Medicare Other | Admitting: Physical Therapy

## 2016-01-17 DIAGNOSIS — R2689 Other abnormalities of gait and mobility: Secondary | ICD-10-CM

## 2016-01-17 DIAGNOSIS — M6281 Muscle weakness (generalized): Secondary | ICD-10-CM

## 2016-01-17 DIAGNOSIS — R296 Repeated falls: Secondary | ICD-10-CM

## 2016-01-17 NOTE — Therapy (Signed)
[facility] [address], [address] [place], [place], [address] Phone: [phone]   Fax:  [phone]  Physical Therapy Treatment  Patient Details  Name: Christine Webster MRN: [id number] Date of Birth: 1941/02/13 Referring Provider: Dr. Corliss BlackerMcNeill   Encounter Date: 01/17/2016      PT End of Session - 01/17/16 1217    Visit Number 4   Number of Visits 10   Date for PT Re-Evaluation 03/03/16   Authorization Type G codes; KX   PT Start Time 1012   PT Stop Time 1100   PT Time Calculation (min) 48 min   Activity Tolerance Other (comment)  cognitive function      Past Medical History  Diagnosis Date  . Depression   . Diabetes mellitus without complication (HCC)   . GERD (gastroesophageal reflux disease)   . Obesity   . Hypertension   . Hyperlipidemia   . Kidney stones   . Vitamin D deficiency   . Genital herpes   . Colon polyps   . Vitamin D deficiency   . Hyperparathyroidism (HCC)     Dr. Talmage NapBalan  . Memory disorder 12/31/2015  . Abnormality of gait 12/31/2015    Past Surgical History  Procedure Laterality Date  . Intestinal malrotation repair    . Nissen fundoplication      for huge hiatal hernia 07/2008  . Thyroid tumor      benign    There were no vitals filed for this visit.      Subjective Assessment - 01/17/16 1010    Subjective Patient presents with RW.  Tends to walk outside of walker when steering for change of directions.  My knee hurts a little bit from the fall 1 week ago.  Denies recent falls.  Denies any difficulty following last PT session.     Currently in Pain? Yes   Pain Score 3    Pain Location Knee   Pain Orientation Right;Left                         [facility] Adult PT Treatment/Exercise - 01/17/16 0001    Ambulation/Gait   Ambulation/Gait Yes   Ambulation/Gait Assistance 3: Mod assist   Ambulation/Gait Assistance Details need physical assist to move walker 50% of the  time and moderate verbal cues to keep feet inside walker   Ambulation Distance (Feet) 60 Feet   Assistive device Rolling walker   Gait Pattern Decreased stride length   Gait Comments looks down constantly, difficulty with feet placement (feet too close or feet crossed at times)   Lumbar Exercises: Seated   Other Seated Lumbar Exercises yellow band rows, extensions 10x each   Knee/Hip Exercises: Aerobic   Nustep L1 8 min   Knee/Hip Exercises: Standing   Heel Raises Both;1 set;10 reps   Hip Abduction Left;Right;10 reps  alternating   Other Standing Knee Exercises psoas stretch, gastroc stretch on stair step 5x R/L;  Front back sways 1 min   Knee/Hip Exercises: Seated   Sit to Sand 5 reps;with UE support       Adjusted walker to appropriate height.             PT Short Term Goals - 01/17/16 1222    PT SHORT TERM GOAL #1   Title The patient will use rolling walker at home and the community for safety secondary to high risk for falls   02/04/16   Time 4  Period Weeks   Status On-going   PT SHORT TERM GOAL #2   Title BERG balance score improved from 34/56 to 38/56 indicating improved standing and gait safety   Time 4   Period Weeks   Status On-going   PT SHORT TERM GOAL #3   Title The patient will be able to stand for 15 min for light meal prep   Time 4   Period Weeks   Status On-going   PT SHORT TERM GOAL #4   Title TUG test improved to 20 sec indicating improved gait speed   Time 4   Period Weeks   Status On-going           PT Long Term Goals - 01/17/16 1222    PT LONG TERM GOAL #1   Title The patient will be independent in HEP for strengthening of LEs and balance training   03/03/16   Time 8   Period Weeks   Status On-going   PT LONG TERM GOAL #2   Title The patient will have gross LE strength 4+/5 needed to descend steps safely at home and church   Time 8   Period Weeks   Status On-going   PT LONG TERM GOAL #3   Title The patient will have improved TUG  score to 15 sec indicating improved gait speed and LE strength   Time 8   Period Weeks   Status On-going   PT LONG TERM GOAL #4   Title Sit to stand 5x improved to 27 sec indicating improved LE strength and mobility   Time 8   Period Weeks   Status On-going   PT LONG TERM GOAL #5   Title BERG balance test improved to 40/56 indicating improved dynamic balance with narrow base of support   Time 8   Period Weeks   Status On-going               Plan - 01/17/16 1217    Clinical Impression Statement The patient presents with a RW today.  Her husband states this is the first time she has used it.  She has great difficulty steering the walker, tending to have feet outside base of walker or crossing feet when making a direction adjustment.  She needs numerous cues and assistance to propel the walker at times.  She continues to have difficulty following instructions with exercises and requires near constant verbal and tactile cues.  Her cognition will limit her progress with therapy and will contribute to her risk of falls.     PT Next Visit Plan use gait belt;  gait training with RW;  LE strengthening;  balance ex;  weight shifting ;  Nu-Step      Patient will benefit from skilled therapeutic intervention in order to improve the following deficits and impairments:     Visit Diagnosis: Muscle weakness (generalized)  Other abnormalities of gait and mobility  Repeated falls     Problem List Patient Active Problem List   Diagnosis Date Noted  . Transient alteration of awareness 12/31/2015  . Memory disorder 12/31/2015  . Abnormality of gait 12/31/2015    Christine Webster 01/17/2016, 12:24 PM  [facility] [address] [place], [place], [address] Phone: [phone]   Fax:  [phone]  Name: Christine Webster MRN: [id number] Date of Birth: July 12, 1941    [name], PT 01/17/2016 12:26 PM Phone:  [phone] Fax: [phone]

## 2016-01-20 ENCOUNTER — Other Ambulatory Visit: Payer: Self-pay | Admitting: Family Medicine

## 2016-01-20 ENCOUNTER — Ambulatory Visit: Payer: Medicare Other | Admitting: Physical Therapy

## 2016-01-20 ENCOUNTER — Encounter: Payer: Self-pay | Admitting: Physical Therapy

## 2016-01-20 DIAGNOSIS — E041 Nontoxic single thyroid nodule: Secondary | ICD-10-CM

## 2016-01-20 DIAGNOSIS — R2689 Other abnormalities of gait and mobility: Secondary | ICD-10-CM

## 2016-01-20 DIAGNOSIS — R296 Repeated falls: Secondary | ICD-10-CM

## 2016-01-20 DIAGNOSIS — M6281 Muscle weakness (generalized): Secondary | ICD-10-CM | POA: Diagnosis not present

## 2016-01-20 DIAGNOSIS — R262 Difficulty in walking, not elsewhere classified: Secondary | ICD-10-CM

## 2016-01-20 DIAGNOSIS — R2681 Unsteadiness on feet: Secondary | ICD-10-CM

## 2016-01-20 DIAGNOSIS — Z9181 History of falling: Secondary | ICD-10-CM

## 2016-01-20 NOTE — Therapy (Signed)
[facility] [address] [place], [place], [address] Phone: [phone]   Fax:  [phone]  Physical Therapy Treatment  Patient Details  Name: Christine Webster MRN: [id number] Date of Birth: Mar 26, 1941 Referring Provider: Dr. Corliss Blacker   Encounter Date: 01/20/2016      PT End of Session - 01/20/16 0934    Visit Number 5   Number of Visits 10   Date for PT Re-Evaluation 03/18/16   Authorization Type G codes; KX   PT Start Time 0929   PT Stop Time 1015   PT Time Calculation (min) 46 min   Activity Tolerance Other (comment);Patient tolerated treatment well   Behavior During Therapy [facility] for tasks assessed/performed      Past Medical History  Diagnosis Date  . Depression   . Diabetes mellitus without complication (HCC)   . GERD (gastroesophageal reflux disease)   . Obesity   . Hypertension   . Hyperlipidemia   . Kidney stones   . Vitamin D deficiency   . Genital herpes   . Colon polyps   . Vitamin D deficiency   . Hyperparathyroidism (HCC)     Dr. Talmage Nap  . Memory disorder 12/31/2015  . Abnormality of gait 12/31/2015    Past Surgical History  Procedure Laterality Date  . Intestinal malrotation repair    . Nissen fundoplication      for huge hiatal hernia 07/2008  . Thyroid tumor      benign    There were no vitals filed for this visit.      Subjective Assessment - 01/20/16 0953    Subjective Patient presents with RW and slow motion thruout session, and needs constant reminders for steering of RW and techniques with exercises. No complain of pain.    Patient is accompained by: Family member  husband   How long can you sit comfortably? no problem    How long can you stand comfortably? wash dishes10 minutes   How long can you walk comfortably? end of driveway   Diagnostic tests MRI;  U/S of carotid, EKG    Patient Stated Goals build my strength, less loss of balance   Currently in Pain?  No/denies                         [facility] Adult PT Treatment/Exercise - 01/20/16 0001    Ambulation/Gait   Ambulation/Gait Yes   Ambulation/Gait Assistance 3: Mod assist   Ambulation/Gait Assistance Details needs A to move RW and constant vc's for technique   Ambulation Distance (Feet) 80 Feet  80 feet in 4 min 17 sec, 1 major LOB regained with A by PTA   Assistive device Rolling walker   Gait Pattern Decreased stride length   Ambulation Surface Level   Gait Comments looks down constantly, difficulty with feet placement (feet too close or feet crossed at times)  feet narrow base, at times crossing over, difficult with tus   Posture/Postural Control   Posture/Postural Control Postural limitations   Postural Limitations Rounded Shoulders;Forward head;Decreased thoracic kyphosis   Lumbar Exercises: Seated   Other Seated Lumbar Exercises yellow band rows, extensions 10x each   Knee/Hip Exercises: Aerobic   Nustep L1   Knee/Hip Exercises: Standing   Heel Raises Both;2 sets;10 reps  1# added   Hip Abduction Both;2 sets;10 reps  1.5#   Hip Extension Both;2 sets;10 reps  1.5# added   Knee/Hip Exercises: Seated   Sit  to [facility] 5 reps;without UE support                  PT Short Term Goals - 01/20/16 0959    PT SHORT TERM GOAL #1   Title The patient will use rolling walker at home and the community for safety secondary to high risk for falls   02/04/16   Time 4   Period Weeks   Status On-going   PT SHORT TERM GOAL #2   Title BERG balance score improved from 34/56 to 38/56 indicating improved standing and gait safety   Time 4   Period Weeks   Status On-going   PT SHORT TERM GOAL #3   Title The patient will be able to stand for 15 min for light meal prep   Time 4   Period Weeks   Status On-going   PT SHORT TERM GOAL #4   Title TUG test improved to 20 sec indicating improved gait speed   Time 4   Period Weeks   Status On-going           PT  Long Term Goals - 01/17/16 1222    PT LONG TERM GOAL #1   Title The patient will be independent in HEP for strengthening of LEs and balance training   03/03/16   Time 8   Period Weeks   Status On-going   PT LONG TERM GOAL #2   Title The patient will have gross LE strength 4+/5 needed to descend steps safely at home and church   Time 8   Period Weeks   Status On-going   PT LONG TERM GOAL #3   Title The patient will have improved TUG score to 15 sec indicating improved gait speed and LE strength   Time 8   Period Weeks   Status On-going   PT LONG TERM GOAL #4   Title Sit to stand 5x improved to 27 sec indicating improved LE strength and mobility   Time 8   Period Weeks   Status On-going   PT LONG TERM GOAL #5   Title BERG balance test improved to 40/56 indicating improved dynamic balance with narrow base of support   Time 8   Period Weeks   Status On-going               Plan - 01/20/16 0957    Clinical Impression Statement Pt still challenged with steering of RW, pt need constant cues and motivation to increase pace with activities. Pt's cognition will limit the progress with therapy and she is still a high risk for falls.    Rehab Potential Good   Clinical Impairments Affecting Rehab Potential high risk of falls;  use gait belt   PT Frequency 2x / week   PT Duration 8 weeks   PT Treatment/Interventions ADLs/Self Care Home Management;DME Instruction;Gait training;Stair training;Functional mobility training;Therapeutic activities;Iontophoresis /ml Dexamethasone;Patient/family education;Neuromuscular re-education;Balance training   PT Next Visit Plan use gait belt;  gait training with RW;  LE strengthening;  balance ex;  weight shifting ;  Nu-Step   PT Home Exercise Plan sit to stand from high chair   Recommended Other Services rolling walker full time   Consulted and Agree with Plan of Care Patient      Patient will benefit from skilled therapeutic intervention in  order to improve the following deficits and impairments:  Decreased balance, Abnormal gait, Decreased coordination, Decreased endurance, Decreased mobility, Decreased strength, Decreased safety awareness, Difficulty walking  Visit Diagnosis:  Muscle weakness (generalized)  Other abnormalities of gait and mobility  Repeated falls  Abnormality of gait due to impairment of balance  Unsteadiness on feet  Difficulty in walking, not elsewhere classified  Falls infrequently     Problem List Patient Active Problem List   Diagnosis Date Noted  . Transient alteration of awareness 12/31/2015  . Memory disorder 12/31/2015  . Abnormality of gait 12/31/2015    NAUMANN-HOUEGNIFIO,Christine Webster PTA 01/20/2016, 10:50 AM  [facility] [address] [place], [place], [address] Phone: [phone]   Fax:  [phone]  Name: Christine Webster MRN: [id number] Date of Birth: 1940-10-31

## 2016-01-22 ENCOUNTER — Ambulatory Visit: Payer: Medicare Other | Admitting: Physical Therapy

## 2016-01-22 ENCOUNTER — Encounter: Payer: Self-pay | Admitting: Physical Therapy

## 2016-01-22 DIAGNOSIS — R296 Repeated falls: Secondary | ICD-10-CM

## 2016-01-22 DIAGNOSIS — M6281 Muscle weakness (generalized): Secondary | ICD-10-CM

## 2016-01-22 DIAGNOSIS — R2689 Other abnormalities of gait and mobility: Secondary | ICD-10-CM

## 2016-01-22 NOTE — Therapy (Signed)
[facility] [address], [address] [place], [place], [address] Phone: [phone]   Fax:  [phone]  Physical Therapy Treatment  Patient Details  Name: Christine Webster MRN: [id number] Date of Birth: 1941-03-12 Referring Provider: Dr. Corliss Blacker   Encounter Date: 01/22/2016      PT End of Session - 01/22/16 1122    Visit Number 6   Number of Visits 10   Date for PT Re-Evaluation 31-Mar-2016   Authorization Type G codes; KX   PT Start Time 1100   PT Stop Time 1145   PT Time Calculation (min) 45 min   Activity Tolerance Other (comment);Patient tolerated treatment well   Behavior During Therapy [facility] for tasks assessed/performed      Past Medical History  Diagnosis Date  . Depression   . Diabetes mellitus without complication (HCC)   . GERD (gastroesophageal reflux disease)   . Obesity   . Hypertension   . Hyperlipidemia   . Kidney stones   . Vitamin D deficiency   . Genital herpes   . Colon polyps   . Vitamin D deficiency   . Hyperparathyroidism (HCC)     Dr. Talmage Nap  . Memory disorder 12/31/2015  . Abnormality of gait 12/31/2015    Past Surgical History  Procedure Laterality Date  . Intestinal malrotation repair    . Nissen fundoplication      for huge hiatal hernia 07/2008  . Thyroid tumor      benign    There were no vitals filed for this visit.      Subjective Assessment - 01/22/16 1120    Subjective Patient had a fall on Monday evening while stepping up at a curb while using her RW. Pt with well helaing skin scaps on bil knees.     Patient is accompained by: Family member  husband   How long can you sit comfortably? no problem    How long can you stand comfortably? wash dishes10 minutes   How long can you walk comfortably? end of driveway   Diagnostic tests MRI;  U/S of carotid, EKG    Patient Stated Goals build my strength, less loss of balance   Currently in Pain? No/denies                          [facility] Adult PT Treatment/Exercise - 01/22/16 0001    Ambulation/Gait   Ambulation/Gait Yes   Ambulation/Gait Assistance 3: Mod assist   Ambulation/Gait Assistance Details needs A   Ambulation Distance (Feet) 100 Feet   Assistive device Rolling walker   Gait Pattern Decreased stride length   Ambulation Surface Level   Gait Comments looks down constantly, difficulty with feet placement (feet too close or feet crossed at times)  narrow base of support, cossing legs over at times, turns   Posture/Postural Control   Posture/Postural Control Postural limitations   Postural Limitations Rounded Shoulders;Forward head;Decreased thoracic kyphosis   Lumbar Exercises: Seated   Other Seated Lumbar Exercises yellow band rows, extensions 10x each   Knee/Hip Exercises: Aerobic   Nustep L1 , 0.37mph   Knee/Hip Exercises: Standing   Heel Raises Both;2 sets;10 reps  1.5# added   Hip Abduction Both;2 sets;10 reps  1.5#   Hip Extension Both;2 sets;10 reps  1.5# added                  PT Short Term Goals - 01/20/16 0959    PT SHORT  TERM GOAL #1   Title The patient will use rolling walker at home and the community for safety secondary to high risk for falls   02/04/16   Time 4   Period Weeks   Status On-going   PT SHORT TERM GOAL #2   Title BERG balance score improved from 34/56 to 38/56 indicating improved standing and gait safety   Time 4   Period Weeks   Status On-going   PT SHORT TERM GOAL #3   Title The patient will be able to stand for 15 min for light meal prep   Time 4   Period Weeks   Status On-going   PT SHORT TERM GOAL #4   Title TUG test improved to 20 sec indicating improved gait speed   Time 4   Period Weeks   Status On-going           PT Long Term Goals - 01/17/16 1222    PT LONG TERM GOAL #1   Title The patient will be independent in HEP for strengthening of LEs and balance training   03/03/16   Time 8   Period  Weeks   Status On-going   PT LONG TERM GOAL #2   Title The patient will have gross LE strength 4+/5 needed to descend steps safely at home and church   Time 8   Period Weeks   Status On-going   PT LONG TERM GOAL #3   Title The patient will have improved TUG score to 15 sec indicating improved gait speed and LE strength   Time 8   Period Weeks   Status On-going   PT LONG TERM GOAL #4   Title Sit to stand 5x improved to 27 sec indicating improved LE strength and mobility   Time 8   Period Weeks   Status On-going   PT LONG TERM GOAL #5   Title BERG balance test improved to 40/56 indicating improved dynamic balance with narrow base of support   Time 8   Period Weeks   Status On-going               Plan - 01/22/16 1126    Clinical Impression Statement Pt continues to be challenged with steering RW and limited strength and coordination in bil LE, decreased clearance of foot with swing phase. Pt felt on Monday while ascending a curb. Pt's cognition will limited the progress with therapy and she is still high risk for falls   Rehab Potential Good   Clinical Impairments Affecting Rehab Potential high risk of falls;  use gait belt   PT Frequency 2x / week   PT Duration 8 weeks   PT Treatment/Interventions ADLs/Self Care Home Management;DME Instruction;Gait training;Stair training;Functional mobility training;Therapeutic activities;Iontophoresis /ml Dexamethasone;Patient/family education;Neuromuscular re-education;Balance training   PT Next Visit Plan use gait belt;  gait training with RW;  LE strengthening;  balance ex;  weight shifting ;  Nu-Step   PT Home Exercise Plan sit to stand from high chair   Recommended Other Services rolling walker full time   Consulted and Agree with Plan of Care Patient      Patient will benefit from skilled therapeutic intervention in order to improve the following deficits and impairments:  Decreased balance, Abnormal gait, Decreased  coordination, Decreased endurance, Decreased mobility, Decreased strength, Decreased safety awareness, Difficulty walking  Visit Diagnosis: Muscle weakness (generalized)  Other abnormalities of gait and mobility  Repeated falls     Problem List Patient Active Problem List  Diagnosis Date Noted  . Transient alteration of awareness 12/31/2015  . Memory disorder 12/31/2015  . Abnormality of gait 12/31/2015    NAUMANN-HOUEGNIFIO,Travius Crochet PTA 01/22/2016, 1:39 PM  [facility] [address], [address] [place], [place], [address] Phone: [phone]   Fax:  [phone]  Name: Yevonne PaxStella M Heider MRN: [id number] Date of Birth: [birth year]

## 2016-01-28 ENCOUNTER — Encounter: Payer: Self-pay | Admitting: Neurology

## 2016-01-29 ENCOUNTER — Encounter: Payer: Self-pay | Admitting: Physical Therapy

## 2016-01-29 ENCOUNTER — Ambulatory Visit: Payer: Medicare Other | Admitting: Physical Therapy

## 2016-01-29 DIAGNOSIS — M6281 Muscle weakness (generalized): Secondary | ICD-10-CM | POA: Diagnosis not present

## 2016-01-29 DIAGNOSIS — R296 Repeated falls: Secondary | ICD-10-CM

## 2016-01-29 DIAGNOSIS — R2689 Other abnormalities of gait and mobility: Secondary | ICD-10-CM

## 2016-01-29 NOTE — Therapy (Signed)
[facility] [address], [address] [place], [place], [address] Phone: [phone]   Fax:  [phone]  Physical Therapy Treatment  Patient Details  Name: Christine Webster MRN: [id number] Date of Birth: 02/20/1941 Referring Provider: Dr. Corliss Blacker   Encounter Date: 01/29/2016      PT End of Session - 01/29/16 1147    Visit Number 7   Number of Visits 10   Date for PT Re-Evaluation 19-Mar-2016   Authorization Type G codes; KX   PT Start Time 1100   PT Stop Time 1144   PT Time Calculation (min) 44 min   Activity Tolerance Other (comment);Patient tolerated treatment well   Behavior During Therapy [facility] for tasks assessed/performed      Past Medical History  Diagnosis Date  . Depression   . Diabetes mellitus without complication (HCC)   . GERD (gastroesophageal reflux disease)   . Obesity   . Hypertension   . Hyperlipidemia   . Kidney stones   . Vitamin D deficiency   . Genital herpes   . Colon polyps   . Vitamin D deficiency   . Hyperparathyroidism (HCC)     Dr. Talmage Nap  . Memory disorder 12/31/2015  . Abnormality of gait 12/31/2015    Past Surgical History  Procedure Laterality Date  . Intestinal malrotation repair    . Nissen fundoplication      for huge hiatal hernia 07/2008  . Thyroid tumor      benign    There were no vitals filed for this visit.      Subjective Assessment - 01/29/16 1117    Subjective Patient has a good day, her knees are healing well from fall last Monday, however, right knee still hurting due to deep scaps.    Patient is accompained by: Family member  husband   How long can you sit comfortably? no problem    How long can you stand comfortably? wash dishes10 minutes   How long can you walk comfortably? end of driveway   Diagnostic tests MRI;  U/S of carotid, EKG    Patient Stated Goals build my strength, less loss of balance   Currently in Pain? No/denies                          [facility] Adult PT Treatment/Exercise - 01/29/16 0001    Ambulation/Gait   Ambulation/Gait Yes   Ambulation/Gait Assistance 4: Min assist   Ambulation/Gait Assistance Details in 6min30 sec   Ambulation Distance (Feet) 80 Feet   Assistive device Rolling walker   Gait Pattern Decreased stride length   Ambulation Surface Level   Gait Comments looks down constantly, difficulty with feet placement (feet too close or feet crossed at times)  narrow BOS, crossing legs over at times, turns difficult   Posture/Postural Control   Posture/Postural Control Postural limitations   Postural Limitations Rounded Shoulders;Forward head;Decreased thoracic kyphosis   Lumbar Exercises: Seated   Other Seated Lumbar Exercises sitting diagonal knee/UE each side    Knee/Hip Exercises: Aerobic   Nustep L1 , 0.66mph  last visit 0.26mph   Knee/Hip Exercises: Standing   Heel Raises Both;1 set;10 reps  2# added   Hip Abduction Both;10 reps;1 set  2# added   Hip Extension Both;10 reps;1 set  2# added   Lateral Step Up Both;1 set;10 reps;Hand Hold: 2;Step Height: 6"  constant vc's   Forward Step Up Both;1 set;10 reps;Hand Hold: 2;Step Height: 6"  pt need constant v's for foot placement   Knee/Hip Exercises: Seated   Sit to Sand 5 reps;without UE support  needs constant reminders to scoot to edge of chair                  PT Short Term Goals - 01/29/16 1150    PT SHORT TERM GOAL #1   Title The patient will use rolling walker at home and the community for safety secondary to high risk for falls   02/04/16   Time 4   Period Weeks   Status Achieved   PT SHORT TERM GOAL #2   Title BERG balance score improved from 34/56 to 38/56 indicating improved standing and gait safety   Time 4   Period Weeks   Status On-going   PT SHORT TERM GOAL #3   Title The patient will be able to stand for 15 min for light meal prep   Time 4   Period Weeks   Status On-going    PT SHORT TERM GOAL #4   Title TUG test improved to 20 sec indicating improved gait speed   Time 4   Period Weeks   Status On-going           PT Long Term Goals - 01/17/16 1222    PT LONG TERM GOAL #1   Title The patient will be independent in HEP for strengthening of LEs and balance training   03/03/16   Time 8   Period Weeks   Status On-going   PT LONG TERM GOAL #2   Title The patient will have gross LE strength 4+/5 needed to descend steps safely at home and church   Time 8   Period Weeks   Status On-going   PT LONG TERM GOAL #3   Title The patient will have improved TUG score to 15 sec indicating improved gait speed and LE strength   Time 8   Period Weeks   Status On-going   PT LONG TERM GOAL #4   Title Sit to stand 5x improved to 27 sec indicating improved LE strength and mobility   Time 8   Period Weeks   Status On-going   PT LONG TERM GOAL #5   Title BERG balance test improved to 40/56 indicating improved dynamic balance with narrow base of support   Time 8   Period Weeks   Status On-going               Plan - 01/29/16 1147    Clinical Impression Statement Pt continues to be challenged with coordination of limbs and steering of RW. Pt needed 682min30 sec to walk 2min 30sec. Pt with decreased clearance of feet with sweingphase and narrow base of support. Pt's cognition is slowing down the progress with therapy and she is of high fall risk. Pt will continue to benefit from skilled Pt to address limitations.    Rehab Potential Good   Clinical Impairments Affecting Rehab Potential high risk of falls;  use gait belt   PT Frequency 2x / week   PT Duration 8 weeks   PT Treatment/Interventions ADLs/Self Care Home Management;DME Instruction;Gait training;Stair training;Functional mobility training;Therapeutic activities;Iontophoresis 4mg /ml Dexamethasone;Patient/family education;Neuromuscular re-education;Balance training   PT Next Visit Plan use gait belt;  gait  training with RW;  LE strengthening;  balance ex;  weight shifting ;  Nu-Step   PT Home Exercise Plan sit to stand from high chair   Recommended Other Services rolling walker full time  Consulted and Agree with Plan of Care Patient      Patient will benefit from skilled therapeutic intervention in order to improve the following deficits and impairments:  Decreased balance, Abnormal gait, Decreased coordination, Decreased endurance, Decreased mobility, Decreased strength, Decreased safety awareness, Difficulty walking  Visit Diagnosis: Muscle weakness (generalized)  Other abnormalities of gait and mobility  Repeated falls     Problem List Patient Active Problem List   Diagnosis Date Noted  . Transient alteration of awareness 12/31/2015  . Memory disorder 12/31/2015  . Abnormality of gait 12/31/2015    NAUMANN-HOUEGNIFIO,Ziggy Chanthavong PTA 01/29/2016, 11:59 AM  [facility] [address], [address] [place], [place], [address] Phone: [phone]   Fax:  [phone]  Name: Christine Webster MRN: [id number] Date of Birth: 20-Sep-1941

## 2016-01-30 ENCOUNTER — Ambulatory Visit
Admission: RE | Admit: 2016-01-30 | Discharge: 2016-01-30 | Disposition: A | Discharge status: Home / Self Care | Payer: Medicare Other | Source: Ambulatory Visit | Attending: Family Medicine | Admitting: Family Medicine

## 2016-01-30 ENCOUNTER — Other Ambulatory Visit (HOSPITAL_COMMUNITY)
Admission: RE | Admit: 2016-01-30 | Discharge: 2016-01-30 | Disposition: A | Discharge status: Home / Self Care | Payer: Medicare Other | Source: Ambulatory Visit | Attending: Radiology | Admitting: Radiology

## 2016-01-30 DIAGNOSIS — E042 Nontoxic multinodular goiter: Secondary | ICD-10-CM | POA: Insufficient documentation

## 2016-01-30 DIAGNOSIS — E041 Nontoxic single thyroid nodule: Secondary | ICD-10-CM

## 2016-01-31 ENCOUNTER — Ambulatory Visit: Payer: Medicare Other | Admitting: Physical Therapy

## 2016-01-31 DIAGNOSIS — R296 Repeated falls: Secondary | ICD-10-CM

## 2016-01-31 DIAGNOSIS — M6281 Muscle weakness (generalized): Secondary | ICD-10-CM

## 2016-01-31 DIAGNOSIS — R2689 Other abnormalities of gait and mobility: Secondary | ICD-10-CM

## 2016-01-31 NOTE — Therapy (Signed)
[facility] [address] [place], [place], [address] Phone: [phone]   Fax:  [phone]  Physical Therapy Treatment  Patient Details  Name: Christine Webster MRN: [id number] Date of Birth: [birth year] Referring Provider: Dr. Corliss Blacker   Encounter Date: 01/31/2016      PT End of Session - 01/31/16 1214    Visit Number 8   Number of Visits 18   Date for PT Re-Evaluation 2016-03-17   Authorization Type G codes needed at 18 ; KX   PT Start Time 1015   PT Stop Time 1055   PT Time Calculation (min) 40 min   Activity Tolerance Patient tolerated treatment well      Past Medical History  Diagnosis Date  . Depression   . Diabetes mellitus without complication (HCC)   . GERD (gastroesophageal reflux disease)   . Obesity   . Hypertension   . Hyperlipidemia   . Kidney stones   . Vitamin D deficiency   . Genital herpes   . Colon polyps   . Vitamin D deficiency   . Hyperparathyroidism (HCC)     Dr. Talmage Nap  . Memory disorder 12/31/2015  . Abnormality of gait 12/31/2015    Past Surgical History  Procedure Laterality Date  . Intestinal malrotation repair    . Nissen fundoplication      for huge hiatal hernia 07/2008  . Thyroid tumor      benign    There were no vitals filed for this visit.      Subjective Assessment - 01/31/16 1020    Subjective States she fell on the carpet yesterday.  She is unsure why she fell, her husband states he is unsure as well since he was not home.  Reports she doesn't use the RW at home but she is using it today in the clinic.     Currently in Pain? No/denies   Pain Score 0-No pain            [facility] PT Assessment - 01/31/16 0001    Berg Balance Test   Sit to Stand Able to stand without using hands and stabilize independently   Standing Unsupported Able to stand safely 2 minutes   Sitting with Back Unsupported but Feet Supported on Floor or Stool Able to sit safely and  securely 2 minutes   Stand to Sit Sits safely with minimal use of hands   Transfers Able to transfer safely, definite need of hands   Standing Unsupported with Eyes Closed Able to stand 10 seconds with supervision   Standing Ubsupported with Feet Together Able to place feet together independently and stand for 1 minute with supervision   From Standing, Reach Forward with Outstretched Arm Can reach forward >12 cm safely (5")   From Standing Position, Pick up Object from Floor Able to pick up shoe, needs supervision   From Standing Position, Turn to Look Behind Over each Shoulder Looks behind from both sides and weight shifts well   Turn 360 Degrees Able to turn 360 degrees safely but slowly   Standing Unsupported, Alternately Place Feet on Step/Stool Able to complete >2 steps/needs minimal assist   Standing Unsupported, One Foot in Front Needs help to step but can hold 15 seconds   Standing on One Leg Unable to try or needs assist to prevent fall   Total Score 39   Timed Up and Go Test   Manual TUG (seconds) 51  with RW this time which slows  patient down                     [facility] Adult PT Treatment/Exercise - 01/31/16 0001    Ambulation/Gait   Ambulation/Gait Yes   Ambulation/Gait Assistance 4: Min assist   Ambulation Distance (Feet) 100 Feet   Assistive device Rolling walker   Gait Pattern Decreased stride length   Ambulation Surface Level   Knee/Hip Exercises: Aerobic   Nustep L1 10 min   Knee/Hip Exercises: Standing   Heel Raises 15 reps   Hip Abduction Both;10 reps;1 set  2# added   Hip Extension Both;10 reps;1 set  2# added   Other Standing Knee Exercises 4 inch step taps 2# weight R/L   Knee/Hip Exercises: Seated   Long Arc Quad Right;Left;10 reps;Weights   Marching Right;Left;Weights  1 minute                  PT Short Term Goals - 01/31/16 1220    PT SHORT TERM GOAL #1   Title The patient will use rolling walker at home and the community for  safety secondary to high risk for falls   02/04/16   Status Achieved   PT SHORT TERM GOAL #2   Title BERG balance score improved from 34/56 to 38/56 indicating improved standing and gait safety   Status Achieved   PT SHORT TERM GOAL #3   Title The patient will be able to stand for 15 min for light meal prep   Time 4   Period Weeks   Status On-going   PT SHORT TERM GOAL #4   Title TUG test improved to 20 sec indicating improved gait speed   Time 4   Period Weeks   Status On-going           PT Long Term Goals - 01/31/16 1221    PT LONG TERM GOAL #1   Title The patient will be independent in HEP for strengthening of LEs and balance training   03/03/16   Time 8   Period Weeks   Status On-going   PT LONG TERM GOAL #2   Title The patient will have gross LE strength 4+/5 needed to descend steps safely at home and church   Time 8   Period Weeks   Status On-going   PT LONG TERM GOAL #3   Title The patient will have improved TUG score to 15 sec indicating improved gait speed and LE strength   Time 8   Period Weeks   Status On-going   PT LONG TERM GOAL #4   Title Sit to stand 5x improved to 27 sec indicating improved LE strength and mobility   Time 8   Period Weeks   Status On-going   PT LONG TERM GOAL #5   Title BERG balance test improved to 40/56 indicating improved dynamic balance with narrow base of support   Time 8   Period Weeks   Status On-going               Plan - 01/31/16 1215    Clinical Impression Statement The patient is able to follow therapist instructions better today.  Improved steering and RW advancement today.  BERG balance test improved by 5 points from initial.  Timed up and Go test much slower secondary to using the RW vs. no assistive device as on initial but patient is much steadier with the walker.  The patient''s husband states they continue to hold hands for short  distance walking but they find the RW helps for longer distance walking (like in  [employer]).  Therapist providing close supervision with all for safety.     PT Next Visit Plan continue gait training with RW with turning and around obstacles;  LE strengthening; balance; Nu-STep;  G code already done      Patient will benefit from skilled therapeutic intervention in order to improve the following deficits and impairments:     Visit Diagnosis: Muscle weakness (generalized)  Other abnormalities of gait and mobility  Repeated falls       G-Codes - 01/31/16 1224    Functional Assessment Tool Used BERG; TUG; clinical judgement   Functional Limitation Mobility: Walking and moving around   Mobility: Walking and Moving Around Current Status [phone]([id number]) At least 60 percent but less than 80 percent impaired, limited or restricted   Mobility: Walking and Moving Around Goal Status [phone](G8979) At least 40 percent but less than 60 percent impaired, limited or restricted      Problem List Patient Active Problem List   Diagnosis Date Noted  . Transient alteration of awareness 12/31/2015  . Memory disorder 12/31/2015  . Abnormality of gait 12/31/2015    Lavinia SharpsStacy Simpson, PT 01/31/2016 12:24 PM Phone: [phone] Fax: [phone]   Vivien PrestoSimpson, Stacy C 01/31/2016, 12:24 PM  [facility] [address] [place], [place], [address] Phone: [phone]   Fax:  [phone]  Name: Yevonne PaxStella M Clink MRN: [id number] Date of Birth: July 26, 1941

## 2016-02-05 ENCOUNTER — Ambulatory Visit: Payer: Medicare Other | Admitting: Physical Therapy

## 2016-02-05 ENCOUNTER — Encounter: Payer: Self-pay | Admitting: Physical Therapy

## 2016-02-05 DIAGNOSIS — R296 Repeated falls: Secondary | ICD-10-CM

## 2016-02-05 DIAGNOSIS — M6281 Muscle weakness (generalized): Secondary | ICD-10-CM | POA: Diagnosis not present

## 2016-02-05 DIAGNOSIS — R2689 Other abnormalities of gait and mobility: Secondary | ICD-10-CM

## 2016-02-05 NOTE — Therapy (Signed)
[facility] [address], [address] [place], [place], [address] Phone: [phone]   Fax:  [phone]  Physical Therapy Treatment  Patient Details  Name: GRACIANA SESSA MRN: [id number] Date of Birth: 11-05-1940 Referring Provider: Dr. Corliss Blacker   Encounter Date: 02/05/2016      PT End of Session - 02/05/16 1026    Visit Number 9   Number of Visits 18   Authorization Type G codes needed at 18 ; KX   PT Start Time 1015   PT Stop Time 1100   PT Time Calculation (min) 45 min   Activity Tolerance Patient tolerated treatment well   Behavior During Therapy [facility] for tasks assessed/performed      Past Medical History  Diagnosis Date  . Depression   . Diabetes mellitus without complication (HCC)   . GERD (gastroesophageal reflux disease)   . Obesity   . Hypertension   . Hyperlipidemia   . Kidney stones   . Vitamin D deficiency   . Genital herpes   . Colon polyps   . Vitamin D deficiency   . Hyperparathyroidism (HCC)     Dr. Talmage Nap  . Memory disorder 12/31/2015  . Abnormality of gait 12/31/2015    Past Surgical History  Procedure Laterality Date  . Intestinal malrotation repair    . Nissen fundoplication      for huge hiatal hernia 07/2008  . Thyroid tumor      benign    There were no vitals filed for this visit.      Subjective Assessment - 02/05/16 1025    Subjective Pt arrives with RW and husband, she feels fine.    Patient is accompained by: Family member   How long can you sit comfortably? no problem    How long can you stand comfortably? wash dishes10 minutes   How long can you walk comfortably? end of driveway   Diagnostic tests MRI;  U/S of carotid, EKG    Patient Stated Goals build my strength, less loss of balance   Currently in Pain? No/denies                         [facility] Adult PT Treatment/Exercise - 02/05/16 0001    Ambulation/Gait   Ambulation/Gait Yes   Ambulation/Gait  Assistance 4: Min guard   Ambulation Distance (Feet) 300 Feet   Assistive device Rolling walker   Gait Pattern Decreased stride length   Ambulation Surface Level   Gait Comments looks down constantly, difficulty with feet placement (feet too close or feet crossed at times)   Knee/Hip Exercises: Aerobic   Nustep L1 10 min  arms and seat # 9   Knee/Hip Exercises: Standing   Heel Raises 20 reps   Hip Abduction Both;10 reps;2 sets  2# added   Hip Extension Both;2 sets;10 reps  2# added   Other Standing Knee Exercises 4 inch step taps 2# weight R/L  needs reminders to initiate   Knee/Hip Exercises: Seated   Long Arc Quad Right;Left;10 reps;Weights   Marching Right;Left;Weights   Sit to [employer] 5 reps;without UE support                  PT Short Term Goals - 01/31/16 1220    PT SHORT TERM GOAL #1   Title The patient will use rolling walker at home and the community for safety secondary to high risk for falls   02/04/16   Status Achieved  PT SHORT TERM GOAL #2   Title BERG balance score improved from 34/56 to 38/56 indicating improved standing and gait safety   Status Achieved   PT SHORT TERM GOAL #3   Title The patient will be able to stand for 15 min for light meal prep   Time 4   Period Weeks   Status On-going   PT SHORT TERM GOAL #4   Title TUG test improved to 20 sec indicating improved gait speed   Time 4   Period Weeks   Status On-going           PT Long Term Goals - 01/31/16 1221    PT LONG TERM GOAL #1   Title The patient will be independent in HEP for strengthening of LEs and balance training   03/03/16   Time 8   Period Weeks   Status On-going   PT LONG TERM GOAL #2   Title The patient will have gross LE strength 4+/5 needed to descend steps safely at home and church   Time 8   Period Weeks   Status On-going   PT LONG TERM GOAL #3   Title The patient will have improved TUG score to 15 sec indicating improved gait speed and LE strength   Time 8    Period Weeks   Status On-going   PT LONG TERM GOAL #4   Title Sit to stand 5x improved to 27 sec indicating improved LE strength and mobility   Time 8   Period Weeks   Status On-going   PT LONG TERM GOAL #5   Title BERG balance test improved to 40/56 indicating improved dynamic balance with narrow base of support   Time 8   Period Weeks   Status On-going               Plan - 02/05/16 1026    Clinical Impression Statement Pt seems to be more allert and follows therapist instruction better. Pt improved with steeering her RW and has more confidence. Pt will continue to benefit from skilled PT to advance balance to improve safety with ambulation, strength and endurance.    Rehab Potential Good   Clinical Impairments Affecting Rehab Potential high risk of falls;  use gait belt   PT Frequency 2x / week   PT Duration 8 weeks   PT Treatment/Interventions ADLs/Self Care Home Management;DME Instruction;Gait training;Stair training;Functional mobility training;Therapeutic activities;Iontophoresis 4mg /ml Dexamethasone;Patient/family education;Neuromuscular re-education;Balance training   PT Next Visit Plan continue gait training with RW with turning and around obstacles;  LE strengthening; balance; Nu-STep;  G code already done   PT Home Exercise Plan sit to stand from high chair   Consulted and Agree with Plan of Care Patient      Patient will benefit from skilled therapeutic intervention in order to improve the following deficits and impairments:  Decreased balance, Abnormal gait, Decreased coordination, Decreased endurance, Decreased mobility, Decreased strength, Decreased safety awareness, Difficulty walking  Visit Diagnosis: Muscle weakness (generalized)  Other abnormalities of gait and mobility  Repeated falls     Problem List Patient Active Problem List   Diagnosis Date Noted  . Transient alteration of awareness 12/31/2015  . Memory disorder 12/31/2015  . Abnormality of  gait 12/31/2015    NAUMANN-HOUEGNIFIO,Karolyn Messing PTA 02/05/2016, 11:01 AM  [facility] [address], [address] [place], [place], [address] Phone: [phone]   Fax:  [phone]  Name: Yevonne PaxStella M Saindon MRN: [id number] Date of Birth: 03/15/1941

## 2016-02-10 ENCOUNTER — Ambulatory Visit: Payer: Medicare Other | Attending: Family Medicine | Admitting: Physical Therapy

## 2016-02-10 ENCOUNTER — Encounter: Payer: Self-pay | Admitting: Physical Therapy

## 2016-02-10 DIAGNOSIS — R296 Repeated falls: Secondary | ICD-10-CM | POA: Diagnosis present

## 2016-02-10 DIAGNOSIS — R2689 Other abnormalities of gait and mobility: Secondary | ICD-10-CM

## 2016-02-10 DIAGNOSIS — M6281 Muscle weakness (generalized): Secondary | ICD-10-CM

## 2016-02-10 NOTE — Therapy (Signed)
[facility] [address] [place], [place], [address] Phone: [phone]   Fax:  [phone]  Physical Therapy Treatment  Patient Details  Name: Christine Webster MRN: [id number] Date of Birth: [birth year] Referring Provider: Dr. Corliss Blacker   Encounter Date: 02/10/2016      PT End of Session - 02/10/16 0933    Visit Number 10   Number of Visits 18   Date for PT Re-Evaluation 03/10/2016   Authorization Type G codes needed at 18 ; KX   PT Start Time 0928   PT Stop Time 1011   PT Time Calculation (min) 43 min   Activity Tolerance Patient tolerated treatment well   Behavior During Therapy [facility] for tasks assessed/performed      Past Medical History  Diagnosis Date  . Depression   . Diabetes mellitus without complication (HCC)   . GERD (gastroesophageal reflux disease)   . Obesity   . Hypertension   . Hyperlipidemia   . Kidney stones   . Vitamin D deficiency   . Genital herpes   . Colon polyps   . Vitamin D deficiency   . Hyperparathyroidism (HCC)     Dr. Talmage Nap  . Memory disorder 12/31/2015  . Abnormality of gait 12/31/2015    Past Surgical History  Procedure Laterality Date  . Intestinal malrotation repair    . Nissen fundoplication      for huge hiatal hernia 07/2008  . Thyroid tumor      benign    There were no vitals filed for this visit.      Subjective Assessment - 02/10/16 0932    Subjective Pt reports she is sore from her falls a few weeks ago.    Currently in Pain? Yes   Pain Score 3    Pain Location Knee   Pain Orientation Right   Pain Descriptors / Indicators Sore   Aggravating Factors  sore from fall spt reports   Pain Relieving Factors Not sure, pt with difficulty expressing   Multiple Pain Sites No                         [facility] Adult PT Treatment/Exercise - 02/10/16 0001    Ambulation/Gait   Ambulation/Gait Yes   Ambulation/Gait Assistance 4: Min guard   Ambulation Distance (Feet) 240 Feet  x 2   Assistive device Rolling walker   Gait Comments VC to look up   Lumbar Exercises: Seated   Sit to Stand 20 reps   Sit to Stand Limitations No UE did 2x 10   Knee/Hip Exercises: Aerobic   Nustep L1 10 min  arms and seat # 9   Knee/Hip Exercises: Standing   Heel Raises Both;20 reps   Hip Abduction Both;10 reps;2 sets  2# added   Hip Extension Both;2 sets;10 reps  2# added, pt frequently forgot what she was doing    Knee/Hip Exercises: Seated   Marching Strengthening;Both;20 reps;Weights   Marching Weights 2 lbs.                  PT Short Term Goals - 02/10/16 0934    PT SHORT TERM GOAL #3   Title The patient will be able to stand for 15 min for light meal prep   Time 4   Period Weeks   Status On-going  7-8 min then needs to sit.            PT Long Term  Goals - 01/31/16 1221    PT LONG TERM GOAL #1   Title The patient will be independent in HEP for strengthening of LEs and balance training   03/03/16   Time 8   Period Weeks   Status On-going   PT LONG TERM GOAL #2   Title The patient will have gross LE strength 4+/5 needed to descend steps safely at home and church   Time 8   Period Weeks   Status On-going   PT LONG TERM GOAL #3   Title The patient will have improved TUG score to 15 sec indicating improved gait speed and LE strength   Time 8   Period Weeks   Status On-going   PT LONG TERM GOAL #4   Title Sit to stand 5x improved to 27 sec indicating improved LE strength and mobility   Time 8   Period Weeks   Status On-going   PT LONG TERM GOAL #5   Title BERG balance test improved to 40/56 indicating improved dynamic balance with narrow base of support   Time 8   Period Weeks   Status On-going               Plan - 02/10/16 0934    Clinical Impression Statement pt performed the exercises very well, good control/technique, she just often forgets what she was doing unless cued by PTA. Often verbal  cues to keep her heaad up during gait and theraputic exercises. On her second round of walking she ran into 3-4 objects.    Rehab Potential Good   Clinical Impairments Affecting Rehab Potential high risk of falls;  use gait belt   PT Frequency 2x / week   PT Duration 8 weeks   PT Treatment/Interventions ADLs/Self Care Home Management;DME Instruction;Gait training;Stair training;Functional mobility training;Therapeutic activities;Iontophoresis 4mg /ml Dexamethasone;Patient/family education;Neuromuscular re-education;Balance training   PT Next Visit Plan LE strength and endurance, standing balance   Consulted and Agree with Plan of Care Patient      Patient will benefit from skilled therapeutic intervention in order to improve the following deficits and impairments:  Decreased balance, Abnormal gait, Decreased coordination, Decreased endurance, Decreased mobility, Decreased strength, Decreased safety awareness, Difficulty walking  Visit Diagnosis: Muscle weakness (generalized)  Other abnormalities of gait and mobility  Repeated falls     Problem List Patient Active Problem List   Diagnosis Date Noted  . Transient alteration of awareness 12/31/2015  . Memory disorder 12/31/2015  . Abnormality of gait 12/31/2015    Kaegan Stigler, PTA 02/10/2016, 10:12 AM  [facility] [address] [place], [place], [address] Phone: [phone]   Fax:  [phone]  Name: Christine Webster MRN: [id number] Date of Birth: [birth year]

## 2016-02-13 ENCOUNTER — Encounter: Payer: Medicare Other | Admitting: Physical Therapy

## 2016-02-14 ENCOUNTER — Ambulatory Visit: Payer: Medicare Other | Admitting: Physical Therapy

## 2016-02-14 DIAGNOSIS — R2689 Other abnormalities of gait and mobility: Secondary | ICD-10-CM

## 2016-02-14 DIAGNOSIS — M6281 Muscle weakness (generalized): Secondary | ICD-10-CM | POA: Diagnosis not present

## 2016-02-14 DIAGNOSIS — R296 Repeated falls: Secondary | ICD-10-CM

## 2016-02-14 NOTE — Therapy (Signed)
[facility] [address], [address] [place], [place], [address] Phone: [phone]   Fax:  [phone]  Physical Therapy Treatment  Patient Details  Name: Christine PaxStella M Fuente MRN: [id number] Date of Birth: [birth year] Referring Provider: Dr. Corliss BlackerMcNeill   Encounter Date: 02/14/2016      PT End of Session - 02/14/16 1202    Visit Number 11   Number of Visits 18   Date for PT Re-Evaluation 03/03/16   Authorization Type G codes needed at 18 ; KX   PT Start Time 1013   PT Stop Time 1057   PT Time Calculation (min) 44 min   Activity Tolerance Patient tolerated treatment well      Past Medical History  Diagnosis Date  . Depression   . Diabetes mellitus without complication (HCC)   . GERD (gastroesophageal reflux disease)   . Obesity   . Hypertension   . Hyperlipidemia   . Kidney stones   . Vitamin D deficiency   . Genital herpes   . Colon polyps   . Vitamin D deficiency   . Hyperparathyroidism (HCC)     Dr. Talmage NapBalan  . Memory disorder 12/31/2015  . Abnormality of gait 12/31/2015    Past Surgical History  Procedure Laterality Date  . Intestinal malrotation repair    . Nissen fundoplication      for huge hiatal hernia 07/2008  . Thyroid tumor      benign    There were no vitals filed for this visit.      Subjective Assessment - 02/14/16 1017    Subjective Husband reports that [facility] the other day and after about 250 steps Mrs. Aaronson had to sit down.  States she felt woozy.   She denies pain or recent falls.  Presents using her RW.     Currently in Pain? No/denies   Pain Score 0-No pain                         [facility] Adult PT Treatment/Exercise - 02/14/16 0001    Ambulation/Gait   Ambulation/Gait Yes   Ambulation/Gait Assistance 4: Min guard   Ambulation Distance (Feet) 240 Feet   Assistive device Rolling walker   Gait Comments cues to look ahead, increase gait speed,  negotiate around obstacles   Lumbar Exercises: Seated   Other Seated Lumbar Exercises red plyo ball hip swings, golf swings, chops, Charlie Browns 30 sec to 1 min each   Knee/Hip Exercises: Aerobic   Nustep L1 10 min  arms and seat # 9   Knee/Hip Exercises: Standing   Other Standing Knee Exercises beach ball UE and head movements and toss catch with narrow base, staggered stand    Other Standing Knee Exercises stepping over and back 5x each   Knee/Hip Exercises: Seated   Long Arc Quad Right;Left;1 set;15 reps;Weights   Long Arc Quad Weight 3 lbs.                  PT Short Term Goals - 02/14/16 1208    PT SHORT TERM GOAL #1   Title The patient will use rolling walker at home and the community for safety secondary to high risk for falls   02/04/16   Status Achieved   PT SHORT TERM GOAL #2   Title BERG balance score improved from 34/56 to 38/56 indicating improved standing and gait safety   Status Achieved   PT  SHORT TERM GOAL #3   Title The patient will be able to stand for 15 min for light meal prep   Time 4   Period Weeks   Status On-going   PT SHORT TERM GOAL #4   Title TUG test improved to 20 sec indicating improved gait speed   Time 4   Period Weeks   Status On-going           PT Long Term Goals - 02/14/16 1209    PT LONG TERM GOAL #1   Title The patient will be independent in HEP for strengthening of LEs and balance training   03/03/16   Time 8   Period Weeks   Status On-going   PT LONG TERM GOAL #2   Title The patient will have gross LE strength 4+/5 needed to descend steps safely at home and church   Time 8   Period Weeks   Status On-going   PT LONG TERM GOAL #3   Title The patient will have improved TUG score to 15 sec indicating improved gait speed and LE strength   Time 8   Period Weeks   Status On-going   PT LONG TERM GOAL #4   Title Sit to stand 5x improved to 27 sec indicating improved LE strength and mobility   Time 8   Period Weeks    Status On-going   PT LONG TERM GOAL #5   Title BERG balance test improved to 40/56 indicating improved dynamic balance with narrow base of support   Time 8   Period Weeks   Status On-going               Plan - 02/14/16 1202    Clinical Impression Statement The patient is able to focus and follow instructions with fewer cues.  With standing dynamic balance exercise she needs only min assist with narrow base of support.  Improved gait with rolling walker although needs near constant cues to hold up head.  With fatigue she does tend to bump walker into objects.  Close supervision with all for safety.     PT Next Visit Plan LE strength and endurance, standing balance      Patient will benefit from skilled therapeutic intervention in order to improve the following deficits and impairments:     Visit Diagnosis: Muscle weakness (generalized)  Other abnormalities of gait and mobility  Repeated falls     Problem List Patient Active Problem List   Diagnosis Date Noted  . Transient alteration of awareness 12/31/2015  . Memory disorder 12/31/2015  . Abnormality of gait 12/31/2015   Christine Webster, PT 02/14/2016 12:11 PM Phone: [phone] Fax: [phone]  Christine Webster 02/14/2016, 12:10 PM  [facility] [address] [place], [place], [address] Phone: [phone]   Fax:  [phone]  Name: Christine Webster MRN: [id number] Date of Birth: [birth year]

## 2016-02-24 ENCOUNTER — Ambulatory Visit: Payer: Medicare Other | Admitting: Physical Therapy

## 2016-02-24 ENCOUNTER — Encounter: Payer: Self-pay | Admitting: Physical Therapy

## 2016-02-24 DIAGNOSIS — M6281 Muscle weakness (generalized): Secondary | ICD-10-CM | POA: Diagnosis not present

## 2016-02-24 DIAGNOSIS — R2689 Other abnormalities of gait and mobility: Secondary | ICD-10-CM

## 2016-02-24 DIAGNOSIS — R296 Repeated falls: Secondary | ICD-10-CM

## 2016-02-24 NOTE — Therapy (Signed)
[facility] [address] [place], [place], [address] Phone: [phone]   Fax:  [phone]  Physical Therapy Treatment  Patient Details  Name: Christine Webster MRN: [id number] Date of Birth: 08-03-1941 Referring Provider: Dr. Corliss Blacker   Encounter Date: 02/24/2016      PT End of Session - 02/24/16 0937    Visit Number 12   Number of Visits 18   Date for PT Re-Evaluation Mar 17, 2016   Authorization Type G codes needed at 18 ; KX   PT Start Time 0928   PT Stop Time 1014   PT Time Calculation (min) 46 min   Activity Tolerance Patient tolerated treatment well   Behavior During Therapy [facility] for tasks assessed/performed      Past Medical History  Diagnosis Date  . Depression   . Diabetes mellitus without complication (HCC)   . GERD (gastroesophageal reflux disease)   . Obesity   . Hypertension   . Hyperlipidemia   . Kidney stones   . Vitamin D deficiency   . Genital herpes   . Colon polyps   . Vitamin D deficiency   . Hyperparathyroidism (HCC)     Dr. Talmage Nap  . Memory disorder 12/31/2015  . Abnormality of gait 12/31/2015    Past Surgical History  Procedure Laterality Date  . Intestinal malrotation repair    . Nissen fundoplication      for huge hiatal hernia 07/2008  . Thyroid tumor      benign    There were no vitals filed for this visit.      Subjective Assessment - 02/24/16 0935    Subjective Pt reports she had the sensation that she is may be falling due to increased walking distance. Pt denies pain. Pt arrived in PT clinic with her RW   Patient is accompained by: Family member  husband   How long can you sit comfortably? no problem    How long can you stand comfortably? wash dishes10 minutes   How long can you walk comfortably? end of driveway   Diagnostic tests MRI;  U/S of carotid, EKG    Patient Stated Goals build my strength, less loss of balance   Currently in Pain? No/denies             [facility] PT Assessment - 02/24/16 0001    Timed Up and Go Test   TUG Normal TUG   Manual TUG (seconds) 19  using Rw 1st trail 28, 2nd trail 20, 3rd 19 sec                     [facility] Adult PT Treatment/Exercise - 02/24/16 0001    Ambulation/Gait   Ambulation/Gait Yes   Ambulation/Gait Assistance 4: Min guard   Ambulation Distance (Feet) 240 Feet   Assistive device Rolling walker   Gait Comments cues to look ahead, increase gait speed, negotiate around obstacles   Lumbar Exercises: Seated   Other Seated Lumbar Exercises red plyo ball hip swings, golf swings, chops, Charlie Browns 30 sec to 1 min each   Knee/Hip Exercises: Aerobic   Nustep L1 10 min  arms and seat # 9   Knee/Hip Exercises: Standing   Other Standing Knee Exercises beach ball UE and head movements and toss catch with narrow base, staggered stand   x 1 min    Other Standing Knee Exercises stepping over and back 5x each   Knee/Hip Exercises: Seated   Long Arc Quad Right;Left;1  set;15 reps;Weights   Long Arc Quad Weight 3 lbs.                  PT Short Term Goals - 02/24/16 0941    PT SHORT TERM GOAL #1   Title The patient will use rolling walker at home and the community for safety secondary to high risk for falls   02/04/16   Time 4   Period Weeks   Status Achieved   PT SHORT TERM GOAL #2   Title BERG balance score improved from 34/56 to 38/56 indicating improved standing and gait safety   Time 4   Period Weeks   Status Achieved   PT SHORT TERM GOAL #3   Title The patient will be able to stand for 15 min for light meal prep   Time 4   Period Weeks   Status On-going   PT SHORT TERM GOAL #4   Title TUG test improved to 20 sec indicating improved gait speed   Time 4   Period Weeks   Status On-going           PT Long Term Goals - 02/14/16 1209    PT LONG TERM GOAL #1   Title The patient will be independent in HEP for strengthening of LEs and balance training   03/03/16    Time 8   Period Weeks   Status On-going   PT LONG TERM GOAL #2   Title The patient will have gross LE strength 4+/5 needed to descend steps safely at home and church   Time 8   Period Weeks   Status On-going   PT LONG TERM GOAL #3   Title The patient will have improved TUG score to 15 sec indicating improved gait speed and LE strength   Time 8   Period Weeks   Status On-going   PT LONG TERM GOAL #4   Title Sit to stand 5x improved to 27 sec indicating improved LE strength and mobility   Time 8   Period Weeks   Status On-going   PT LONG TERM GOAL #5   Title BERG balance test improved to 40/56 indicating improved dynamic balance with narrow base of support   Time 8   Period Weeks   Status On-going               Plan - 02/24/16 1610    Clinical Impression Statement Pt is more aller and needs less verbal cues to perform simple task as transfer to and off the Nu-step. Continues to improves with ambulation with RW. Pt will conti ue to benefit from skilled Pt to advance strength, endurance and safety with ambulation.   Rehab Potential Good   Clinical Impairments Affecting Rehab Potential high risk of falls;  use gait belt   PT Frequency 2x / week   PT Duration 8 weeks   PT Treatment/Interventions ADLs/Self Care Home Management;DME Instruction;Gait training;Stair training;Functional mobility training;Therapeutic activities;Iontophoresis 4mg /ml Dexamethasone;Patient/family education;Neuromuscular re-education;Balance training   PT Next Visit Plan LE strength and endurance, standing balance   PT Home Exercise Plan sit to stand from high chair   Consulted and Agree with Plan of Care Patient      Patient will benefit from skilled therapeutic intervention in order to improve the following deficits and impairments:  Decreased balance, Abnormal gait, Decreased coordination, Decreased endurance, Decreased mobility, Decreased strength, Decreased safety awareness, Difficulty  walking  Visit Diagnosis: Muscle weakness (generalized)  Other abnormalities of gait and mobility  Repeated falls     Problem List Patient Active Problem List   Diagnosis Date Noted  . Transient alteration of awareness 12/31/2015  . Memory disorder 12/31/2015  . Abnormality of gait 12/31/2015    NAUMANN-HOUEGNIFIO,Teren Zurcher PTA 02/24/2016, 10:28 AM  [facility] [address], [address] [place], [place], [address] Phone: [phone]   Fax:  [phone]  Name: Yevonne PaxStella M Mall MRN: [id number] Date of Birth: December 07, 1940

## 2016-02-28 ENCOUNTER — Ambulatory Visit: Payer: Medicare Other | Admitting: Physical Therapy

## 2016-02-28 DIAGNOSIS — M6281 Muscle weakness (generalized): Secondary | ICD-10-CM

## 2016-02-28 DIAGNOSIS — R2689 Other abnormalities of gait and mobility: Secondary | ICD-10-CM

## 2016-02-28 DIAGNOSIS — R296 Repeated falls: Secondary | ICD-10-CM

## 2016-02-28 NOTE — Therapy (Signed)
[facility] [address], [address] [place], [place], [address] Phone: [phone]   Fax:  [phone]  Physical Therapy Treatment  Patient Details  Name: Christine Webster MRN: [id number] Date of Birth: 11-Aug-1941 Referring Provider: Dr. Corliss BlackerMcNeill   Encounter Date: 02/28/2016      PT End of Session - 02/28/16 1048    Visit Number 13   Number of Visits 18   Date for PT Re-Evaluation 03/03/16   Authorization Type G codes needed at 18 ; KX   PT Start Time 1015   PT Stop Time 1053   PT Time Calculation (min) 38 min   Activity Tolerance Patient tolerated treatment well      Past Medical History  Diagnosis Date  . Depression   . Diabetes mellitus without complication (HCC)   . GERD (gastroesophageal reflux disease)   . Obesity   . Hypertension   . Hyperlipidemia   . Kidney stones   . Vitamin D deficiency   . Genital herpes   . Colon polyps   . Vitamin D deficiency   . Hyperparathyroidism (HCC)     Dr. Talmage NapBalan  . Memory disorder 12/31/2015  . Abnormality of gait 12/31/2015    Past Surgical History  Procedure Laterality Date  . Intestinal malrotation repair    . Nissen fundoplication      for huge hiatal hernia 07/2008  . Thyroid tumor      benign    There were no vitals filed for this visit.      Subjective Assessment - 02/28/16 1018    Subjective Initially reports no falls but later in treatment session, she reports she fell while standing in front of the TV, bruising her back.    No pain today.  States she didn't use [place]W [place] but she is using it today.     Currently in Pain? No/denies   Pain Score 0-No pain                         [facility] Adult PT Treatment/Exercise - 02/28/16 0001    Lumbar Exercises: Seated   Other Seated Lumbar Exercises red plyo ball hip swings, golf swings, chops, Charlie Browns 30 sec to 1 min each   Other Seated Lumbar Exercises press downs on  walker 10x   Knee/Hip Exercises: Aerobic   Nustep L2 8 min  arms and seat # 9   Knee/Hip Exercises: Standing   Heel Raises Both;20 reps   Hip Abduction Both;10 reps;2 sets  2# added   Hip Extension Both;2 sets;10 reps  2# added, pt frequently forgot what she was doing    Other Standing Knee Exercises step taps with 2# weights 10x   Other Standing Knee Exercises standing head, trunk and UE movements for balance challenge   Knee/Hip Exercises: Seated   Long Arc Quad Right;Left;1 set;15 reps;Weights   Long Arc Quad Weight 4 lbs.   Other Seated Knee/Hip Exercises seated HS stretches R/L 5x each   Sit to Sand 5 reps;without UE support                  PT Short Term Goals - 02/28/16 1218    PT SHORT TERM GOAL #1   Title The patient will use rolling walker at home and the community for safety secondary to high risk for falls   02/04/16   Status Achieved   PT SHORT TERM GOAL #2  Title BERG balance score improved from 34/56 to 38/56 indicating improved standing and gait safety   Status Achieved   PT SHORT TERM GOAL #3   Title The patient will be able to stand for 15 min for light meal prep   Time 4   Period Weeks   Status On-going   PT SHORT TERM GOAL #4   Title TUG test improved to 20 sec indicating improved gait speed   Time 4   Period Weeks   Status On-going           PT Long Term Goals - 02/28/16 1219    PT LONG TERM GOAL #1   Title The patient will be independent in HEP for strengthening of LEs and balance training   03/03/16   Time 8   Period Weeks   Status On-going   PT LONG TERM GOAL #2   Title The patient will have gross LE strength 4+/5 needed to descend steps safely at home and church   Time 8   Period Weeks   Status On-going   PT LONG TERM GOAL #3   Title The patient will have improved TUG score to 15 sec indicating improved gait speed and LE strength   Time 8   Period Weeks   Status On-going   PT LONG TERM GOAL #4   Title Sit to stand 5x  improved to 27 sec indicating improved LE strength and mobility   Time 8   Period Weeks   Status On-going   PT LONG TERM GOAL #5   Title BERG balance test improved to 40/56 indicating improved dynamic balance with narrow base of support   Time 8   Period Weeks   Status On-going               Plan - 02/28/16 1213    Clinical Impression Statement The patient's cognitive status will continue to slow rehab progression and increase risk of falls.   The patient loses focus easily with tasks.  She and her husband report another fall (not using the walker.)  He husband asks about transitioning to the cane in the community.  Recommended that she have continued 2 sided support  secondary to multiple falls in the last few months.  Will recheck BERG and TUG tests for further recommendations and awareness of specific fall risks.     PT Next Visit Plan BERG, TUG, 5x sit to stand;  check progress toward goals;  possible discharge in 2-4 visits.        Patient will benefit from skilled therapeutic intervention in order to improve the following deficits and impairments:     Visit Diagnosis: Muscle weakness (generalized)  Other abnormalities of gait and mobility  Repeated falls     Problem List Patient Active Problem List   Diagnosis Date Noted  . Transient alteration of awareness 12/31/2015  . Memory disorder 12/31/2015  . Abnormality of gait 12/31/2015    [name], PT 02/28/2016 12:20 PM Phone: [phone] Fax: [phone]  Christine Webster 02/28/2016, 12:20 PM  [facility] [address] [place], [place], [address] Phone: [phone]   Fax:  [phone]  Name: Christine Webster MRN: [id number] Date of Birth: [birth year]

## 2016-03-02 ENCOUNTER — Encounter: Payer: Self-pay | Admitting: Physical Therapy

## 2016-03-02 ENCOUNTER — Ambulatory Visit: Payer: Medicare Other | Admitting: Physical Therapy

## 2016-03-02 DIAGNOSIS — R2689 Other abnormalities of gait and mobility: Secondary | ICD-10-CM

## 2016-03-02 DIAGNOSIS — R296 Repeated falls: Secondary | ICD-10-CM

## 2016-03-02 DIAGNOSIS — M6281 Muscle weakness (generalized): Secondary | ICD-10-CM | POA: Diagnosis not present

## 2016-03-02 NOTE — Therapy (Signed)
[facility] [address] [place], [place], [address] Phone: [phone]   Fax:  [phone]  Physical Therapy Treatment  Patient Details  Name: Christine Webster MRN: [id number] Date of Birth: 1941/02/12 Referring Provider: Dr. Addison Lank   Encounter Date: 03/02/2016      PT End of Session - 03/02/16 0956    Visit Number 14   Number of Visits 18   Date for PT Re-Evaluation 2016/03/23   Authorization Type G codes needed at 76 ; KX   PT Start Time 0929   PT Stop Time 1015   PT Time Calculation (min) 46 min   Activity Tolerance Patient tolerated treatment well   Behavior During Therapy [facility] for tasks assessed/performed      Past Medical History  Diagnosis Date  . Depression   . Diabetes mellitus without complication ([place])   . GERD (gastroesophageal reflux disease)   . Obesity   . Hypertension   . Hyperlipidemia   . Kidney stones   . Vitamin D deficiency   . Genital herpes   . Colon polyps   . Vitamin D deficiency   . Hyperparathyroidism ([place])     Dr. Chalmers Cater  . Memory disorder 12/31/2015  . Abnormality of gait 12/31/2015    Past Surgical History  Procedure Laterality Date  . Intestinal malrotation repair    . Nissen fundoplication      for huge hiatal hernia 07/2008  . Thyroid tumor      benign    There were no vitals filed for this visit.      Subjective Assessment - 03/02/16 0953    Subjective No pain, no recent falls since last PT visit.    Patient is accompained by: Family member   How long can you sit comfortably? no problem    How long can you stand comfortably? wash dishes10 minutes   How long can you walk comfortably? end of driveway   Diagnostic tests MRI;  U/S of carotid, EKG    Patient Stated Goals build my strength, less loss of balance   Currently in Pain? No/denies            [facility] PT Assessment - 03/02/16 0001    Timed Up and Go Test   TUG Normal TUG  24sec, after several  trails 37, 35 sec, pt needs redirection   Manual TUG (seconds) 24  using RW and reminders to complete task                     [facility] Adult PT Treatment/Exercise - 03/02/16 0001    Ambulation/Gait   Ambulation/Gait Yes   Ambulation/Gait Assistance 4: Min guard   Ambulation Distance (Feet) 240 Feet   Assistive device Rolling walker   Gait Comments cues to look ahead, increase gait speed, negotiate around obstacles   Lumbar Exercises: Seated   Other Seated Lumbar Exercises red plyo ball hip swings, golf swings, chops, Charlie Browns 30 sec to 1 min each   Knee/Hip Exercises: Aerobic   Nustep L2 8 min  arms and seat # 9   Stepper L 1 x 26mn arms and seat #(, 0.297m   Knee/Hip Exercises: Standing   Heel Raises Both;20 reps   Hip Abduction Both;10 reps;2 sets  2# added   Hip Extension Both;2 sets;10 reps  2# added   Other Standing Knee Exercises step taps with 2# weights 10x   Other Standing Knee Exercises standing head, trunk and UE movements  for balance challenge   Knee/Hip Exercises: Seated   Long Arc Quad Right;Left;1 set;15 reps;Weights   Long Arc Quad Weight 4 lbs.   Sit to Sand 5 reps;without UE support                  PT Short Term Goals - 03/02/16 1013    PT SHORT TERM GOAL #1   Title The patient will use rolling walker at home and the community for safety secondary to high risk for falls   02/04/16   Time 4   Period Weeks   Status Achieved   PT SHORT TERM GOAL #2   Title BERG balance score improved from 34/56 to 38/56 indicating improved standing and gait safety   Time 4   Period Weeks   Status Achieved   PT SHORT TERM GOAL #3   Title The patient will be able to stand for 15 min for light meal prep   Time 4   Period Weeks   Status On-going   PT SHORT TERM GOAL #4   Title TUG test improved to 20 sec indicating improved gait speed  1st trail 37sec, 35, sec 33 sec and 24 sec after redirection    Time 4   Period Weeks   Status Partially  Met           PT Long Term Goals - 03/02/16 1014    PT LONG TERM GOAL #1   Title The patient will be independent in HEP for strengthening of LEs and balance training   03/03/16   Time 8   Period Weeks   Status On-going   PT LONG TERM GOAL #2   Title The patient will have gross LE strength 4+/5 needed to descend steps safely at home and church   Time 8   Period Weeks   Status On-going   PT LONG TERM GOAL #3   Title The patient will have improved TUG score to 15 sec indicating improved gait speed and LE strength   Time 8   Period Weeks   Status On-going   PT LONG TERM GOAL #4   Title Sit to stand 5x improved to 27 sec indicating improved LE strength and mobility   Time 8   Period Weeks   Status On-going   PT LONG TERM GOAL #5   Title BERG balance test improved to 40/56 indicating improved dynamic balance with narrow base of support   Time 8   Period Weeks   Status On-going               Plan - 03/02/16 0956    Clinical Impression Statement Pt continues to loose easily focus with tasks. Pt with imporved pace on Nu-step. Pt will continue to benefit from skilled PT to improve strenght, safety awarness to prevent falls in the future.    Rehab Potential Good   Clinical Impairments Affecting Rehab Potential high risk of falls;  use gait belt   PT Frequency 2x / week   PT Duration 8 weeks   PT Treatment/Interventions ADLs/Self Care Home Management;DME Instruction;Gait training;Stair training;Functional mobility training;Therapeutic activities;Iontophoresis 54m/ml Dexamethasone;Patient/family education;Neuromuscular re-education;Balance training   PT Next Visit Plan BERG,  possible discharge in 2-3 visits.     PT Home Exercise Plan sit to stand from high chair   Consulted and Agree with Plan of Care Patient      Patient will benefit from skilled therapeutic intervention in order to improve the following deficits and impairments:  Decreased  balance, Abnormal gait,  Decreased coordination, Decreased endurance, Decreased mobility, Decreased strength, Decreased safety awareness, Difficulty walking  Visit Diagnosis: Muscle weakness (generalized)  Other abnormalities of gait and mobility  Repeated falls     Problem List Patient Active Problem List   Diagnosis Date Noted  . Transient alteration of awareness 12/31/2015  . Memory disorder 12/31/2015  . Abnormality of gait 12/31/2015    NAUMANN-HOUEGNIFIO,Naydeline Morace PTA 03/02/2016, 10:22 AM  [facility] [address], [place], [place], [address] Phone: [phone]   Fax:  [phone]  Name: Christine Webster MRN: [id number] Date of Birth: 11-23-1940

## 2016-03-06 ENCOUNTER — Ambulatory Visit: Payer: Medicare Other | Admitting: Physical Therapy

## 2016-03-06 DIAGNOSIS — M6281 Muscle weakness (generalized): Secondary | ICD-10-CM | POA: Diagnosis not present

## 2016-03-06 DIAGNOSIS — R296 Repeated falls: Secondary | ICD-10-CM

## 2016-03-06 DIAGNOSIS — R2689 Other abnormalities of gait and mobility: Secondary | ICD-10-CM

## 2016-03-06 NOTE — Therapy (Signed)
[facility] [address], [place], [place], [address] Phone: [phone]   Fax:  [phone]  Physical Therapy Treatment/Recertification   Patient Details  Name: Christine Webster MRN: [id number] Date of Birth: 12/30/1940 Referring Provider: Dr. Addison Lank   Encounter Date: 03/06/2016      PT End of Session - 03/06/16 1028    Visit Number 15   Number of Visits 20   Date for PT Re-Evaluation 2016/04/04   Authorization Type G codes needed at 11 ; KX   PT Start Time 1013   PT Stop Time 1100   PT Time Calculation (min) 47 min   Activity Tolerance Patient tolerated treatment well      Past Medical History  Diagnosis Date  . Depression   . Diabetes mellitus without complication ([place])   . GERD (gastroesophageal reflux disease)   . Obesity   . Hypertension   . Hyperlipidemia   . Kidney stones   . Vitamin D deficiency   . Genital herpes   . Colon polyps   . Vitamin D deficiency   . Hyperparathyroidism ([place])     Dr. Chalmers Cater  . Memory disorder 12/31/2015  . Abnormality of gait 12/31/2015    Past Surgical History  Procedure Laterality Date  . Intestinal malrotation repair    . Nissen fundoplication      for huge hiatal hernia 07/2008  . Thyroid tumor      benign    There were no vitals filed for this visit.      Subjective Assessment - 03/06/16 1021    Subjective Denies falls.  Husband reports that on some days she shuffles her feet.     Currently in Pain? No/denies   Pain Score 0-No pain            [facility] PT Assessment - 03/06/16 0001    Ambulation/Gait   Ambulation/Gait Assistance 5: Supervision   Ambulation Distance (Feet) 350 Feet   Assistive device Rolling walker   6 Minute Walk- Baseline   6 Minute Walk- Baseline --  350 feet in 6 min with RW   Standardized Balance Assessment   Five times sit to stand comments  29.1   Berg Balance Test   Sit to Stand Able to stand without using hands and  stabilize independently   Standing Unsupported Able to stand safely 2 minutes   Sitting with Back Unsupported but Feet Supported on Floor or Stool Able to sit safely and securely 2 minutes   Stand to Sit Sits safely with minimal use of hands   Transfers Able to transfer safely, minor use of hands   Standing Unsupported with Eyes Closed Able to stand 10 seconds safely   Standing Ubsupported with Feet Together Able to place feet together independently and stand for 1 minute with supervision   From Standing, Reach Forward with Outstretched Arm Can reach forward >12 cm safely (5")   From Standing Position, Pick up Object from Floor Able to pick up shoe safely and easily   From Standing Position, Turn to Look Behind Over each Shoulder Looks behind from both sides and weight shifts well   Turn 360 Degrees Able to turn 360 degrees safely but slowly   Standing Unsupported, Alternately Place Feet on Step/Stool Able to complete 4 steps without aid or supervision   Standing Unsupported, One Foot in Front Needs help to step but can hold 15 seconds   Standing on One Leg Able to lift leg independently  and hold equal to or more than 3 seconds   Total Score 45                     [facility] Adult PT Treatment/Exercise - 03/06/16 0001    Knee/Hip Exercises: Aerobic   Nustep L2 8 min  arms and seat # 9   Knee/Hip Exercises: Seated   Sit to Sand 5 reps;without UE support       Discussed driveway walking program with RW with patient and husband.  Only to be done with his supervision for safety. Patient asks about practicing single leg standing at home.  Recommended she not do this without close supervision from her husband.             PT Short Term Goals - 03/06/16 1054    PT SHORT TERM GOAL #1   Title The patient will use rolling walker at home and the community for safety secondary to high risk for falls   02/04/16   Status Achieved   PT SHORT TERM GOAL #2   Title BERG balance score  improved from 34/56 to 38/56 indicating improved standing and gait safety   Status Achieved   PT SHORT TERM GOAL #3   Title The patient will be able to stand for 15 min for light meal prep   Time 4   Period Weeks   Status On-going   PT SHORT TERM GOAL #4   Title TUG test improved to 20 sec indicating improved gait speed   Time 4   Period Weeks   Status Partially Met           PT Long Term Goals - 03/06/16 1055    PT LONG TERM GOAL #1   Title The patient will be independent in HEP for strengthening of LEs and balance training   03/03/16   Time 8   Period Weeks   Status On-going   PT LONG TERM GOAL #2   Title The patient will have gross LE strength 4+/5 needed to descend steps safely at home and church   Time 8   Period Weeks   Status On-going   PT LONG TERM GOAL #3   Title The patient will have improved TUG score to 15 sec indicating improved gait speed and LE strength   Time 8   Period Weeks   Status On-going   PT LONG TERM GOAL #4   Title Sit to stand 5x improved to 27 sec indicating improved LE strength and mobility   Baseline 29.1   Time 8   Status On-going   PT LONG TERM GOAL #5   Title BERG balance test improved to 40/56 indicating improved dynamic balance with narrow base of support   Status Achieved   Additional Long Term Goals   Additional Long Term Goals Yes   PT LONG TERM GOAL #6   Title Six minute walk test with RW 400 feet with supervision needed for community ambulation               Plan - 03/06/16 1147    Clinical Impression Statement The patient is progressing with rehab goals.  She has made good improvements in BERG balance test from 39/56 to 45/56 but with continued moderate risk of falls.  Decreased cognition will also contribute to higher risk of falls.  During balance testing, as she tries to stand on one leg for longer duration, she requires therapist assist to keep from falling.  Her gait  continues to be slow with head down but with  improved steering and obstacle negotiation.  She continues to be very limited in gait endurance (Six minute walk 350 feet) with RW.  She would benefit from PT 5 more visits for continued balance ex and gait endurance progression.    Rehab Potential Good   PT Frequency 2x / week   PT Duration 4 weeks   PT Treatment/Interventions ADLs/Self Care Home Management;DME Instruction;Gait training;Stair training;Functional mobility training;Therapeutic activities;Iontophoresis 8m/ml Dexamethasone;Patient/family education;Neuromuscular re-education;Balance training   PT Next Visit Plan 5 more visits for dynamic balance, narrow base of support balance;  gait endurance with RW;  needs KX modifiers;  G code at visit 18      Patient will benefit from skilled therapeutic intervention in order to improve the following deficits and impairments:  Decreased balance, Abnormal gait, Decreased coordination, Decreased endurance, Decreased mobility, Decreased strength, Decreased safety awareness, Difficulty walking  Visit Diagnosis: Muscle weakness (generalized) - Plan: PT plan of care cert/re-cert  Other abnormalities of gait and mobility - Plan: PT plan of care cert/re-cert  Repeated falls - Plan: PT plan of care cert/re-cert     Problem List Patient Active Problem List   Diagnosis Date Noted  . Transient alteration of awareness 12/31/2015  . Memory disorder 12/31/2015  . Abnormality of gait 12/31/2015   SRuben Im PT 03/06/2016 12:03 PM Phone: [phone]: [phone] SAlvera Singh5/26/2017, 12:00 PM  [facility] [address] [place] [id number]: [phone]  Fax:  [phone] Name: STELISHA ZAWADZKIMRN: [id number] of Birth: [birth year]

## 2016-03-10 ENCOUNTER — Telehealth: Payer: Self-pay | Admitting: Neurology

## 2016-03-10 NOTE — Telephone Encounter (Signed)
Called and spoke to pt's husband. Earlier appt scheduled for Thursday 6/1 @ noon. Agreed to arrive 15 min prior to scheduled appt time.

## 2016-03-10 NOTE — Telephone Encounter (Signed)
Received a referral from Dr. Zachery DauerBarnes on Wandra ArthursStella Topel wanting to see if we can get this patient in sooner to be seen than the apt that is scheduled for August. Pt was last seen March 2017 by Dr. Anne HahnWillis

## 2016-03-11 ENCOUNTER — Ambulatory Visit: Payer: Medicare Other | Admitting: Physical Therapy

## 2016-03-11 ENCOUNTER — Encounter: Payer: Self-pay | Admitting: Physical Therapy

## 2016-03-11 DIAGNOSIS — M6281 Muscle weakness (generalized): Secondary | ICD-10-CM

## 2016-03-11 DIAGNOSIS — R296 Repeated falls: Secondary | ICD-10-CM

## 2016-03-11 DIAGNOSIS — R2689 Other abnormalities of gait and mobility: Secondary | ICD-10-CM

## 2016-03-11 NOTE — Therapy (Signed)
[facility] [address] [place], [place], [address] Phone: [phone]   Fax:  [phone]  Physical Therapy Treatment  Patient Details  Name: Christine Webster MRN: [id number] Date of Birth: 02-05-1941 Referring Provider: Dr. Addison Lank   Encounter Date: 03/11/2016      PT End of Session - 03/11/16 0808    Visit Number 16   Number of Visits 18   Date for PT Re-Evaluation 2016/04/13   Authorization Type G-codes needed at 66; KX   PT Start Time 0758   PT Stop Time 0843   PT Time Calculation (min) 45 min   Activity Tolerance Patient tolerated treatment well   Behavior During Therapy [facility] for tasks assessed/performed      Past Medical History  Diagnosis Date  . Depression   . Diabetes mellitus without complication ([place])   . GERD (gastroesophageal reflux disease)   . Obesity   . Hypertension   . Hyperlipidemia   . Kidney stones   . Vitamin D deficiency   . Genital herpes   . Colon polyps   . Vitamin D deficiency   . Hyperparathyroidism ([place])     Dr. Chalmers Cater  . Memory disorder 12/31/2015  . Abnormality of gait 12/31/2015    Past Surgical History  Procedure Laterality Date  . Intestinal malrotation repair    . Nissen fundoplication      for huge hiatal hernia 07/2008  . Thyroid tumor      benign    There were no vitals filed for this visit.      Subjective Assessment - 03/11/16 0808    Subjective Feeling good this early morning   Patient is accompained by: Family member   How long can you sit comfortably? no problem    How long can you stand comfortably? wash dishes10 minutes   How long can you walk comfortably? end of driveway   Diagnostic tests MRI;  U/S of carotid, EKG    Patient Stated Goals build my strength, less loss of balance   Currently in Pain? No/denies                         [facility] Adult PT Treatment/Exercise - 03/11/16 0001    Ambulation/Gait   Ambulation/Gait  Yes   Ambulation/Gait Assistance 5: Supervision   Ambulation Distance (Feet) 200 Feet   Assistive device Rolling walker   Gait Comments cues to look ahead and for steering of  RW at times   Lumbar Exercises: Seated   Other Seated Lumbar Exercises red plyo ball hip swings, chops, Charlie Browns x10 standing on blue balance pad with close supervision   Knee/Hip Exercises: Aerobic   Nustep L2 10 min  arms and seat # 9   Knee/Hip Exercises: Standing   Other Standing Knee Exercises step taps with x 45mn   Other Standing Knee Exercises standing on blue balance pad finding balance & gentle weightshift Lt/Rt   Knee/Hip Exercises: Seated   Long Arc Quad Right;Left;1 set;15 reps;Weights   Long Arc Quad Weight 4 lbs.   Sit to Sand 5 reps;without UE support  5 reps while staning up on blue balance pad, with A at times                  PT Short Term Goals - 03/06/16 1054    PT SHORT TERM GOAL #1   Title The patient will use rolling walker at home and the community  for safety secondary to high risk for falls   02/04/16   Status Achieved   PT SHORT TERM GOAL #2   Title BERG balance score improved from 34/56 to 38/56 indicating improved standing and gait safety   Status Achieved   PT SHORT TERM GOAL #3   Title The patient will be able to stand for 15 min for light meal prep   Time 4   Period Weeks   Status On-going   PT SHORT TERM GOAL #4   Title TUG test improved to 20 sec indicating improved gait speed   Time 4   Period Weeks   Status Partially Met           PT Long Term Goals - 03/06/16 1055    PT LONG TERM GOAL #1   Title The patient will be independent in HEP for strengthening of LEs and balance training   03/03/16   Time 8   Period Weeks   Status On-going   PT LONG TERM GOAL #2   Title The patient will have gross LE strength 4+/5 needed to descend steps safely at home and church   Time 8   Period Weeks   Status On-going   PT LONG TERM GOAL #3   Title The patient  will have improved TUG score to 15 sec indicating improved gait speed and LE strength   Time 8   Period Weeks   Status On-going   PT LONG TERM GOAL #4   Title Sit to stand 5x improved to 27 sec indicating improved LE strength and mobility   Baseline 29.1   Time 8   Status On-going   PT LONG TERM GOAL #5   Title BERG balance test improved to 40/56 indicating improved dynamic balance with narrow base of support   Status Achieved   Additional Long Term Goals   Additional Long Term Goals Yes   PT LONG TERM GOAL #6   Title Six minute walk test with RW 400 feet with supervision needed for community ambulation               Plan - 03/11/16 0809    Clinical Impression Statement Pt with improved pace with RW, but with sudden unconrolled turns what presents a fall risk. Pt will benefit from PT 4 more visits for ccntinued balance ex and gait endurance progression.    Rehab Potential Good   Clinical Impairments Affecting Rehab Potential high risk of falls;  use gait belt   PT Frequency 2x / week   PT Duration 4 weeks   PT Treatment/Interventions ADLs/Self Care Home Management;DME Instruction;Gait training;Stair training;Functional mobility training;Therapeutic activities;Iontophoresis 76m/ml Dexamethasone;Patient/family education;Neuromuscular re-education;Balance training   PT Next Visit Plan 4 more visits for dynamic balance, narrow base of support balance;  gait endurance with RW;  needs KX modifiers;  G code at visit 18   PT Home Exercise Plan sit to stand from high chair   Consulted and Agree with Plan of Care Patient      Patient will benefit from skilled therapeutic intervention in order to improve the following deficits and impairments:  Decreased balance, Abnormal gait, Decreased coordination, Decreased endurance, Decreased mobility, Decreased strength, Decreased safety awareness, Difficulty walking  Visit Diagnosis: Muscle weakness (generalized)  Repeated falls  Other  abnormalities of gait and mobility     Problem List Patient Active Problem List   Diagnosis Date Noted  . Transient alteration of awareness 12/31/2015  . Memory disorder 12/31/2015  . Abnormality of  gait 12/31/2015    NAUMANN-HOUEGNIFIO,Cypress Hinkson PTA 03/11/2016, 8:43 AM  [facility] [address], [place], [place], [address] Phone: [phone]   Fax:  [phone]  Name: Christine Webster MRN: [id number] Date of Birth: 1941-07-28

## 2016-03-12 ENCOUNTER — Ambulatory Visit (INDEPENDENT_AMBULATORY_CARE_PROVIDER_SITE_OTHER): Payer: Medicare Other | Admitting: Neurology

## 2016-03-12 ENCOUNTER — Encounter: Payer: Self-pay | Admitting: Neurology

## 2016-03-12 VITALS — BP 152/88 | HR 78 | Ht 67.0 in | Wt 177.5 lb

## 2016-03-12 DIAGNOSIS — R269 Unspecified abnormalities of gait and mobility: Secondary | ICD-10-CM

## 2016-03-12 DIAGNOSIS — R413 Other amnesia: Secondary | ICD-10-CM

## 2016-03-12 NOTE — Progress Notes (Signed)
Reason for visit: Memory disturbance, gait disturbance  Christine PaxStella M Webster is an 75 y.o. female  History of present illness:  Ms. Christine KidneyCrutchfield is a 75 year old right-handed white female with a history of a progressive memory disturbance. The patient had a significant confusional episode associated with a febrile illness on 12/20/2015. The patient has not had any recurrence of this since last seen. The patient does have short-term memory issues, she has difficulty with remembering recent conversations, she will misplace things about the house. She has not operated a motor vehicle since March 2017. The patient does have some difficulty with directions while driving. She has a baseline gait disturbance, MRI of the brain did show some small vessel disease, but significant cortical atrophy and hydrocephalus ex vacuo was noted by MRI. The patient is not had any falls since last seen, she does use a walker when ambulating independently.  Past Medical History  Diagnosis Date  . Depression   . Diabetes mellitus without complication ([facility])   . GERD (gastroesophageal reflux disease)   . Obesity   . Hypertension   . Hyperlipidemia   . Webster stones   . Vitamin D deficiency   . Genital herpes   . Colon polyps   . Vitamin D deficiency   . Hyperparathyroidism ([facility])     Dr. Talmage NapBalan  . Memory disorder 12/31/2015  . Abnormality of gait 12/31/2015    Past Surgical History  Procedure Laterality Date  . Intestinal malrotation repair    . Nissen fundoplication      for huge hiatal hernia 07/2008  . Thyroid tumor      benign    Family History  Problem Relation Age of Onset  . Sleep apnea Father   . Alcohol abuse Father   . Stroke Father   . Dementia Mother   . Parkinsonism Sister     Social history:  reports that she has never smoked. She does not have any smokeless tobacco history on file. She reports that she does not drink alcohol or use illicit drugs.   No Known  Allergies  Medications:  Prior to Admission medications   Medication Sig Start Date End Date Taking? Authorizing Provider  aspirin 325 MG tablet Take 325 mg by mouth daily.   Yes Historical Provider, MD  atorvastatin (LIPITOR) 10 MG tablet Take 10 mg by mouth daily. Reported on 01/07/2016   Yes Historical Provider, MD  buPROPion (WELLBUTRIN XL) 150 MG 24 hr tablet Take 150 mg by mouth daily. Reported on 01/07/2016 07/22/15  Yes Historical Provider, MD  Cholecalciferol (VITAMIN D PO) Take 1 tablet by mouth daily. 2000 units   Yes Historical Provider, MD  esomeprazole (NEXIUM) 40 MG capsule Take 40 mg by mouth 2 (two) times daily before a meal.    Yes Historical Provider, MD  FLUoxetine (PROZAC) 20 MG capsule Take 20 mg by mouth daily.   Yes Historical Provider, MD  ibuprofen (ADVIL,MOTRIN) 200 MG tablet Take 200 mg by mouth every 6 (six) hours as needed.   Yes Historical Provider, MD    ROS:  Out of a complete 14 system review of symptoms, the patient complains only of the following symptoms, and all other reviewed systems are negative.  Memory disturbance Balance problems  Blood pressure 152/88, pulse 78, height 5\' 7"  (1.702 m), weight 177 lb 8 oz (80.513 kg).  Physical Exam  General: The patient is alert and cooperative at the time of the examination.  Skin: No significant peripheral edema is noted.  Neurologic Exam  Mental status: The patient is alert and oriented x 3 at the time of the examination. The patient has apparent normal recent and remote memory, with an apparently normal attention span and concentration ability. Mini-Mental Status Examination done today shows a total score 26/30.   Cranial nerves: Facial symmetry is present. Speech is normal, no aphasia or dysarthria is noted. Extraocular movements are full. Visual fields are full.  Motor: The patient has good strength in all 4 extremities. Mild apraxia with the use of the extremities is seen.  Sensory examination:  Soft touch sensation is symmetric on the face, arms, and legs.  Coordination: The patient has good finger-nose-finger and heel-to-shin bilaterally.  Gait and station: The patient has a slightly wide-based gait. Tandem gait is unsteady. Romberg is negative.  Reflexes: Deep tendon reflexes are symmetric.   MRI brain 12/20/15:   IMPRESSION: 1. No acute intracranial infarct or other abnormality identified. 2. Age advanced cerebral atrophy with mild chronic small vessel ischemic disease.  * MRI scan images were reviewed online. I agree with the written report.   Assessment/Plan:  1. Mild gait disturbance  2. Memory disturbance  I have discussed with the patient the possibility of going on a medication such as Aricept or Exelon. She is not sure that she wants to start a new medication, she will contact me in the future if she desires to go on a drug. I have also offered the possibility of engaging in research. If she desires any of these options, she is to contact our office. The patient will follow-up in August 2017, we will continue to follow the memory issue. She is remaining safe with her walking, she has not had any further falls.   Greater than 50% of this office visit time was spent with counseling with the patient. Time spent with the patient was 25-30 minutes.  Marlan Palau MD 03/12/2016 6:21 PM  [facility] [address] [place], [place] [address]  Phone [phone] Fax [phone]

## 2016-03-13 ENCOUNTER — Other Ambulatory Visit: Payer: Self-pay | Admitting: Neurology

## 2016-03-13 MED ORDER — DONEPEZIL HCL 5 MG PO TABS: 5.0000 mg | ORAL_TABLET | Freq: Every day | ORAL | Status: DC

## 2016-03-13 NOTE — Telephone Encounter (Signed)
Husband called, states wife was here for office visit yesterday, Dr. Anne HahnWillis recommended Aricept. Wife has decided that she will try this medication.

## 2016-03-16 ENCOUNTER — Encounter: Payer: Self-pay | Admitting: Physical Therapy

## 2016-03-16 ENCOUNTER — Ambulatory Visit: Payer: Medicare Other | Attending: Family Medicine | Admitting: Physical Therapy

## 2016-03-16 DIAGNOSIS — M6281 Muscle weakness (generalized): Secondary | ICD-10-CM | POA: Diagnosis not present

## 2016-03-16 DIAGNOSIS — R2689 Other abnormalities of gait and mobility: Secondary | ICD-10-CM | POA: Diagnosis present

## 2016-03-16 DIAGNOSIS — R296 Repeated falls: Secondary | ICD-10-CM | POA: Diagnosis present

## 2016-03-16 NOTE — Therapy (Signed)
[facility] [address], [place], [place], [address] Phone: [phone]   Fax:  [phone]  Physical Therapy Treatment  Patient Details  Name: Christine Webster MRN: [id number] Date of Birth: 11-30-1940 Referring Provider: Dr. Addison Lank   Encounter Date: 03/16/2016      PT End of Session - 03/16/16 1115    Visit Number 17   Number of Visits 18   Date for PT Re-Evaluation Apr 12, 2016   Authorization Type G-codes needed at 2; KX   PT Start Time 1055   PT Stop Time 1140   PT Time Calculation (min) 45 min   Activity Tolerance Patient tolerated treatment well   Behavior During Therapy [facility] for tasks assessed/performed      Past Medical History  Diagnosis Date  . Depression   . Diabetes mellitus without complication ([place])   . GERD (gastroesophageal reflux disease)   . Obesity   . Hypertension   . Hyperlipidemia   . Kidney stones   . Vitamin D deficiency   . Genital herpes   . Colon polyps   . Vitamin D deficiency   . Hyperparathyroidism ([place])     Dr. Chalmers Cater  . Memory disorder 12/31/2015  . Abnormality of gait 12/31/2015    Past Surgical History  Procedure Laterality Date  . Intestinal malrotation repair    . Nissen fundoplication      for huge hiatal hernia 07/2008  . Thyroid tumor      benign    There were no vitals filed for this visit.      Subjective Assessment - 03/16/16 1112    Subjective Pt has a good day,    Patient is accompained by: Family member   How long can you sit comfortably? no problem    How long can you stand comfortably? wash dishes10 minutes   How long can you walk comfortably? end of driveway   Diagnostic tests MRI;  U/S of carotid, EKG    Patient Stated Goals build my strength, less loss of balance   Currently in Pain? No/denies                         [facility] Adult PT Treatment/Exercise - 03/16/16 0001    Ambulation/Gait   Ambulation/Gait Yes   Ambulation/Gait Assistance 6: Modified independent (Device/Increase time)   Ambulation Distance (Feet) --  6 min, around 762fet   Assistive device Rolling walker   Gait Comments pt walking with out assistant at side, but in sight for verbal cues to    Lumbar Exercises: Seated   Other Seated Lumbar Exercises yellow plyo ball chops in standing, Charlie Browns x10 standing on blue balance pad with close supervision  with close Supervision for safety   Knee/Hip Exercises: Aerobic   Nustep L2 10 min  arms and seat # 9, 0.228m   Knee/Hip Exercises: Standing   Other Standing Knee Exercises step taps with x 28m47m  Knee/Hip Exercises: Seated   Long Arc Quad Right;Left;1 set;15 reps;Weights   Long Arc Quad Weight 4 lbs.   Sit to Sand 5 reps;without UE support                  PT Short Term Goals - 03/16/16 1123    PT SHORT TERM GOAL #1   Title The patient will use rolling walker at home and the community for safety secondary to high risk for falls   02/04/16   Time  4   Period Weeks   Status Achieved   PT SHORT TERM GOAL #2   Title BERG balance score improved from 34/56 to 38/56 indicating improved standing and gait safety   Time 4   Period Weeks   Status Achieved   PT SHORT TERM GOAL #4   Title TUG test improved to 20 sec indicating improved gait speed   Time 4   Period Weeks   Status Partially Met           PT Long Term Goals - 03/06/16 1055    PT LONG TERM GOAL #1   Title The patient will be independent in HEP for strengthening of LEs and balance training   03/03/16   Time 8   Period Weeks   Status On-going   PT LONG TERM GOAL #2   Title The patient will have gross LE strength 4+/5 needed to descend steps safely at home and church   Time 8   Period Weeks   Status On-going   PT LONG TERM GOAL #3   Title The patient will have improved TUG score to 15 sec indicating improved gait speed and LE strength   Time 8   Period Weeks   Status On-going   PT LONG TERM  GOAL #4   Title Sit to stand 5x improved to 27 sec indicating improved LE strength and mobility   Baseline 29.1   Time 8   Status On-going   PT LONG TERM GOAL #5   Title BERG balance test improved to 40/56 indicating improved dynamic balance with narrow base of support   Status Achieved   Additional Long Term Goals   Additional Long Term Goals Yes   PT LONG TERM GOAL #6   Title Six minute walk test with RW 400 feet with supervision needed for community ambulation               Plan - 03/16/16 1116    Clinical Impression Statement Pt with improved pace and confidence with RW, still with sudden uncontrolled turns what presents a fall risk. Pt will benefit from PT 3 more visits for ontinued  balance ex and gait endurance progression.    Rehab Potential Good   Clinical Impairments Affecting Rehab Potential high risk of falls;  use gait belt   PT Frequency 2x / week   PT Duration 4 weeks   PT Treatment/Interventions ADLs/Self Care Home Management;DME Instruction;Gait training;Stair training;Functional mobility training;Therapeutic activities;Iontophoresis 85m/ml Dexamethasone;Patient/family education;Neuromuscular re-education;Balance training   PT Next Visit Plan 34 more visits for dynamic balance, narrow base of support balance;  gait endurance with RW;  needs KX modifiers;  G code at visit 18   PT Home Exercise Plan sit to stand from high chair   Consulted and Agree with Plan of Care Patient      Patient will benefit from skilled therapeutic intervention in order to improve the following deficits and impairments:  Decreased balance, Abnormal gait, Decreased coordination, Decreased endurance, Decreased mobility, Decreased strength, Decreased safety awareness, Difficulty walking  Visit Diagnosis: Muscle weakness (generalized)  Repeated falls  Other abnormalities of gait and mobility     Problem List Patient Active Problem List   Diagnosis Date Noted  . Transient  alteration of awareness 12/31/2015  . Memory disorder 12/31/2015  . Abnormality of gait 12/31/2015    NAUMANN-HOUEGNIFIO,Gianlucas Evenson PTA 03/16/2016, 11:41 AM  [facility] [address] [place] [address]: [phone]  Fax:  [phone]  Name: SYDNY SCHNITZLER MRN: [id number] Date of Birth: 1941/06/25

## 2016-03-18 ENCOUNTER — Encounter: Payer: Self-pay | Admitting: Physical Therapy

## 2016-03-18 ENCOUNTER — Ambulatory Visit: Payer: Medicare Other | Admitting: Physical Therapy

## 2016-03-18 DIAGNOSIS — M6281 Muscle weakness (generalized): Secondary | ICD-10-CM

## 2016-03-18 DIAGNOSIS — R296 Repeated falls: Secondary | ICD-10-CM

## 2016-03-18 DIAGNOSIS — R2689 Other abnormalities of gait and mobility: Secondary | ICD-10-CM

## 2016-03-18 NOTE — Therapy (Signed)
[facility] [address], [address] [place], [place], [address] Phone: [phone]   Fax:  [phone]  Physical Therapy Treatment  Patient Details  Name: Christine Webster MRN: [id number] Date of Birth: 08/27/1941 Referring Provider: Dr. Corliss Blacker   Encounter Date: 03/18/2016      PT End of Session - 03/18/16 0931    Visit Number 18   Number of Visits 18   Date for PT Re-Evaluation 04-28-2016   Authorization Type G-codes needed at 18; KX   PT Start Time 0929   PT Stop Time 1015   PT Time Calculation (min) 46 min   Activity Tolerance Patient tolerated treatment well   Behavior During Therapy [facility] for tasks assessed/performed      Past Medical History  Diagnosis Date  . Depression   . Diabetes mellitus without complication ([facility])   . GERD (gastroesophageal reflux disease)   . Obesity   . Hypertension   . Hyperlipidemia   . Kidney stones   . Vitamin D deficiency   . Genital herpes   . Colon polyps   . Vitamin D deficiency   . Hyperparathyroidism ([facility])     Dr. Talmage Nap  . Memory disorder 12/31/2015  . Abnormality of gait 12/31/2015    Past Surgical History  Procedure Laterality Date  . Intestinal malrotation repair    . Nissen fundoplication      for huge hiatal hernia 07/2008  . Thyroid tumor      benign    There were no vitals filed for this visit.      Subjective Assessment - 03/18/16 0930    Subjective Pt has no complain of pain, feels improvement with walking with RW   Patient is accompained by: Family member   How long can you sit comfortably? no problem    How long can you stand comfortably? wash dishes10 minutes   How long can you walk comfortably? end of driveway   Diagnostic tests MRI;  U/S of carotid, EKG    Patient Stated Goals build my strength, less loss of balance   Currently in Pain? No/denies            [facility] PT Assessment - 03/18/16 0001    Ambulation/Gait   Ambulation/Gait Yes   Ambulation Distance (Feet) 120 Feet   Assistive device Rolling walker   Gait Comments pt walking with out assistant at side, but in sight for verbal cues to    Standardized Balance Assessment   Five times sit to stand comments  18  without upper extremity support   Berg Balance Test   Sit to Stand Able to stand without using hands and stabilize independently   Standing Unsupported Able to stand safely 2 minutes   Sitting with Back Unsupported but Feet Supported on Floor or Stool Able to sit safely and securely 2 minutes   Stand to Sit Sits safely with minimal use of hands   Transfers Able to transfer safely, minor use of hands   Standing Unsupported with Eyes Closed Able to stand 10 seconds safely   Standing Ubsupported with Feet Together Able to place feet together independently and stand for 1 minute with supervision   From Standing, Reach Forward with Outstretched Arm Can reach forward >12 cm safely (5")   From Standing Position, Pick up Object from Floor Able to pick up shoe safely and easily   From Standing Position, Turn to Look Behind Over each Shoulder Looks behind from both sides and weight  shifts well   Turn 360 Degrees Able to turn 360 degrees safely in 4 seconds or less   Standing Unsupported, Alternately Place Feet on Step/Stool Able to stand independently and complete 8 steps >20 seconds   Standing Unsupported, One Foot in Front Needs help to step but can hold 15 seconds   Standing on One Leg Able to lift leg independently and hold equal to or more than 3 seconds   Total Score 48   Timed Up and Go Test   TUG Normal TUG   Manual TUG (seconds) 19                     [facility] Adult PT Treatment/Exercise - 03/18/16 0001    Bed Mobility   Bed Mobility --  BERG Balance Test and TUG test performed   Knee/Hip Exercises: Aerobic   Nustep L2 10 min  arms and seat # 9, 0.72mph                  PT Short Term Goals - 03/18/16 0935    PT SHORT TERM GOAL #1    Title The patient will use rolling walker at home and the community for safety secondary to high risk for falls   02/04/16   Time 4   Period Weeks   Status Achieved   PT SHORT TERM GOAL #2   Title BERG balance score improved from 34/56 to 38/56 indicating improved standing and gait safety   Time 4   Period Weeks   Status Achieved   PT SHORT TERM GOAL #3   Title The patient will be able to stand for 15 min for light meal prep   Time 4   Period Weeks   Status Achieved   PT SHORT TERM GOAL #4   Title TUG test improved to 20 sec indicating improved gait speed  As of June 7,  19sec   Time 4   Period Weeks   Status Achieved           PT Long Term Goals - 03/18/16 1610    PT LONG TERM GOAL #1   Title The patient will be independent in HEP for strengthening of LEs and balance training   03/03/16   Time 8   Period Weeks   Status On-going   PT LONG TERM GOAL #2   Title The patient will have gross LE strength 4+/5 needed to descend steps safely at home and church   Time 8   Period Weeks   Status On-going   PT LONG TERM GOAL #3   Title The patient will have improved TUG score to 15 sec indicating improved gait speed and LE strength  As of March 18, 2016  19 sec   Time 8   Period Weeks   Status On-going   PT LONG TERM GOAL #4   Title Sit to stand 5x improved to 27 sec indicating improved LE strength and mobility  As of March 18, 2016  18sec without UE support   Time 8   Period Weeks   Status Achieved   PT LONG TERM GOAL #5   Title BERG balance test improved to 40/56 indicating improved dynamic balance with narrow base of support   Time 8   Period Weeks   Status Achieved   PT LONG TERM GOAL #6   Title Six minute walk test with RW 400 feet with supervision needed for community ambulation   Time 8   Period  Weeks   Status On-going               Plan - 03/18/16 0932    Clinical Impression Statement Pt continues to improve with confidence with ambulation with RW, however,  still sudden unconrolled turns what presents a fall risk. Pt will benefit from PT 2 more visits for continueing balnace exercises and gait endurance progression   Rehab Potential Good   Clinical Impairments Affecting Rehab Potential high risk of falls;  use gait belt   PT Frequency 2x / week   PT Duration 4 weeks   PT Next Visit Plan Perform 6 min walk test, 2 more visits for dynamic balance, narrow base of support balance;  gait endurance with RW;  needs KX modifiers;     PT Home Exercise Plan current HEP   Recommended Other Services rolling walker full time   Consulted and Agree with Plan of Care Patient      Patient will benefit from skilled therapeutic intervention in order to improve the following deficits and impairments:  Decreased balance, Abnormal gait, Decreased coordination, Decreased endurance, Decreased mobility, Decreased strength, Decreased safety awareness, Difficulty walking  Visit Diagnosis: Muscle weakness (generalized)  Repeated falls  Other abnormalities of gait and mobility       G-Codes - 03/18/16 1104    Functional Assessment Tool Used TUG, 5x sit to stand   Functional Limitation Mobility: Walking and moving around   Mobility: Walking and Moving Around Current Status [phone](G8978) At least 40 percent but less than 60 percent impaired, limited or restricted   Mobility: Walking and Moving Around Goal Status [phone](G8979) At least 40 percent but less than 60 percent impaired, limited or restricted      Problem List Patient Active Problem List   Diagnosis Date Noted  . Transient alteration of awareness 12/31/2015  . Memory disorder 12/31/2015  . Abnormality of gait 12/31/2015    Terance Pomplun Naumann-Houegnifio, PTA 03/18/2016 11:05 AM  Lorrene ReidKelly Takacs, PT 03/18/2016 11:07 AM  [facility] [address], [address] [place], [place], [address] Phone: [phone]   Fax:  [phone]  Name: Christine Webster MRN:  [id number] Date of Birth: 03/07/1941

## 2016-03-23 ENCOUNTER — Ambulatory Visit: Payer: Medicare Other | Admitting: Physical Therapy

## 2016-03-23 ENCOUNTER — Encounter: Payer: Self-pay | Admitting: Physical Therapy

## 2016-03-23 DIAGNOSIS — M6281 Muscle weakness (generalized): Secondary | ICD-10-CM | POA: Diagnosis not present

## 2016-03-23 DIAGNOSIS — R2689 Other abnormalities of gait and mobility: Secondary | ICD-10-CM

## 2016-03-23 DIAGNOSIS — R296 Repeated falls: Secondary | ICD-10-CM

## 2016-03-23 NOTE — Therapy (Signed)
[facility] [address], [place], [place], [address] Phone: [phone]   Fax:  [phone]  Physical Therapy Treatment  Patient Details  Name: Christine Webster MRN: [id number] Date of Birth: 02/21/1941 Referring Provider: Dr. Addison Lank   Encounter Date: 03/23/2016      PT End of Session - 03/23/16 1151    Visit Number 19   Number of Visits 28   Date for PT Re-Evaluation 2016/04/05   Authorization Type G-codes needed at 50; KX   PT Start Time 1143   PT Stop Time 1230   PT Time Calculation (min) 47 min   Activity Tolerance Patient tolerated treatment well   Behavior During Therapy [facility] for tasks assessed/performed      Past Medical History  Diagnosis Date  . Depression   . Diabetes mellitus without complication ([place])   . GERD (gastroesophageal reflux disease)   . Obesity   . Hypertension   . Hyperlipidemia   . Kidney stones   . Vitamin D deficiency   . Genital herpes   . Colon polyps   . Vitamin D deficiency   . Hyperparathyroidism ([place])     Dr. Chalmers Cater  . Memory disorder 12/31/2015  . Abnormality of gait 12/31/2015    Past Surgical History  Procedure Laterality Date  . Intestinal malrotation repair    . Nissen fundoplication      for huge hiatal hernia 07/2008  . Thyroid tumor      benign    There were no vitals filed for this visit.      Subjective Assessment - 03/23/16 1146    Subjective Pt has no complain of pain, using her RW for ambulation, pt has one more visit this week before D/C to HEP   Patient is accompained by: Family member   How long can you sit comfortably? no problem    How long can you stand comfortably? wash dishes10 minutes   How long can you walk comfortably? end of driveway   Diagnostic tests MRI;  U/S of carotid, EKG    Patient Stated Goals build my strength, less loss of balance   Currently in Pain? No/denies                         [facility] Adult PT  Treatment/Exercise - 03/23/16 0001    Ambulation/Gait   Ambulation/Gait Yes   Ambulation/Gait Assistance 6: Modified independent (Device/Increase time)   Ambulation Distance (Feet) 560 Feet   Assistive device Rolling walker   Gait Comments 6 min walking test, pt walking with S    Lumbar Exercises: Seated   Other Seated Lumbar Exercises standing: yellow plyoball chops in standing, Charlie Browns x10 with S   Knee/Hip Exercises: Aerobic   Nustep L2 10 min  arms and seat # 9, 0.58mh   Knee/Hip Exercises: Standing   Other Standing Knee Exercises step taps with x 19m   Other Standing Knee Exercises balance pad with feet together, initially needed B UE support improved to close S   Knee/Hip Exercises: Seated   Sit to Sand 5 reps;without UE support                  PT Short Term Goals - 03/18/16 0935    PT SHORT TERM GOAL #1   Title The patient will use rolling walker at home and the community for safety secondary to high risk for falls   02/04/16   Time 4  Period Weeks   Status Achieved   PT SHORT TERM GOAL #2   Title BERG balance score improved from 34/56 to 38/56 indicating improved standing and gait safety   Time 4   Period Weeks   Status Achieved   PT SHORT TERM GOAL #3   Title The patient will be able to stand for 15 min for light meal prep   Time 4   Period Weeks   Status Achieved   PT SHORT TERM GOAL #4   Title TUG test improved to 20 sec indicating improved gait speed  As of June 7,  19sec   Time 4   Period Weeks   Status Achieved           PT Long Term Goals - 03/23/16 1228    PT LONG TERM GOAL #1   Title The patient will be independent in HEP for strengthening of LEs and balance training   03/03/16   Time 8   Period Weeks   Status On-going   PT LONG TERM GOAL #2   Title The patient will have gross LE strength 4+/5 needed to descend steps safely at home and church   Time 8   Period Weeks   Status Achieved   PT LONG TERM GOAL #3   Title The  patient will have improved TUG score to 15 sec indicating improved gait speed and LE strength  As of March 18, 2016   19 sec   Time 8   Period Weeks   Status Partially Met   PT LONG TERM GOAL #4   Title Sit to stand 5x improved to 27 sec indicating improved LE strength and mobility  As of March 23, 2016  19 seconds   Time 8   Period Weeks   Status Achieved   PT LONG TERM GOAL #5   Title BERG balance test improved to 40/56 indicating improved dynamic balance with narrow base of support   Time 8   Period Weeks   Status Achieved   PT LONG TERM GOAL #6   Title Six minute walk test with RW 400 feet with supervision needed for community ambulation   Time 8   Period Weeks   Status Achieved               Plan - 03/23/16 1152    Clinical Impression Statement Pt improved with confidence with ambulation with RW. Pt improved with BERG and TUG, what reduces fall risk to some degree. Pt will be D/C to HEP next visit.    Rehab Potential Good   Clinical Impairments Affecting Rehab Potential high risk of falls;  use gait belt   PT Frequency 2x / week   PT Duration 4 weeks   PT Treatment/Interventions ADLs/Self Care Home Management;DME Instruction;Gait training;Stair training;Functional mobility training;Therapeutic activities;Iontophoresis 67m/ml Dexamethasone;Patient/family education;Neuromuscular re-education;Balance training   PT Next Visit Plan D/C next visit   PT Home Exercise Plan current HEP   Recommended Other Services rolling walker full time   Consulted and Agree with Plan of Care Patient      Patient will benefit from skilled therapeutic intervention in order to improve the following deficits and impairments:  Decreased balance, Abnormal gait, Decreased coordination, Decreased endurance, Decreased mobility, Decreased strength, Decreased safety awareness, Difficulty walking  Visit Diagnosis: Muscle weakness (generalized)  Repeated falls  Other abnormalities of gait and  mobility     Problem List Patient Active Problem List   Diagnosis Date Noted  . Transient alteration of  awareness 12/31/2015  . Memory disorder 12/31/2015  . Abnormality of gait 12/31/2015    NAUMANN-HOUEGNIFIO,Christine Webster PTA 03/23/2016, 12:34 PM  [facility] [address] [place], [place], [address] Phone: [phone]   Fax:  [phone]  Name: JENNALYNN RIVARD MRN: [id number] Date of Birth: Sep 14, 1941

## 2016-03-30 ENCOUNTER — Ambulatory Visit: Payer: Medicare Other

## 2016-04-01 ENCOUNTER — Ambulatory Visit: Payer: Medicare Other

## 2016-04-01 DIAGNOSIS — M6281 Muscle weakness (generalized): Secondary | ICD-10-CM | POA: Diagnosis not present

## 2016-04-01 DIAGNOSIS — R2689 Other abnormalities of gait and mobility: Secondary | ICD-10-CM

## 2016-04-01 DIAGNOSIS — R296 Repeated falls: Secondary | ICD-10-CM

## 2016-04-01 NOTE — Patient Instructions (Signed)
KNEE: Extension, Long Arc Quad (Weight)  Place weight around leg. Raise leg until knee is straight. Hold _5__ seconds. Use ___ lb weight. _10__ reps per set (each leg), 4-5__ sets per day, __7_ days per week    Knee Raise   Lift knee and then lower it. Repeat with other knee. Repeat _10__ times each leg. Do _4-5___ sessions per day.  http://gt2.exer.us/445   Copyright  VHI. All rights reserved.  Toe Up   Gently rise up on toes and back on heels. Repeat _20___ times. Do 4-5____ sessions per day.  [facility] [address], [address] [place], [place] [address] Phone # [phone] Fax [phone]  

## 2016-04-01 NOTE — Therapy (Signed)
[facility] [address], [place], [place], [address] Phone: [phone]   Fax:  [phone]  Physical Therapy Treatment  Patient Details  Name: Christine Webster MRN: [id number] Date of Birth: 01-20-1941 Referring Provider: Dr. Addison Lank   Encounter Date: 04/01/2016      PT End of Session - 04/01/16 1049    Visit Number 20   PT Start Time 1011   PT Stop Time 1051   PT Time Calculation (min) 40 min   Activity Tolerance Patient tolerated treatment well   Behavior During Therapy [facility] for tasks assessed/performed      Past Medical History  Diagnosis Date  . Depression   . Diabetes mellitus without complication ([place])   . GERD (gastroesophageal reflux disease)   . Obesity   . Hypertension   . Hyperlipidemia   . Kidney stones   . Vitamin D deficiency   . Genital herpes   . Colon polyps   . Vitamin D deficiency   . Hyperparathyroidism ([place])     Dr. Chalmers Cater  . Memory disorder 12/31/2015  . Abnormality of gait 12/31/2015    Past Surgical History  Procedure Laterality Date  . Intestinal malrotation repair    . Nissen fundoplication      for huge hiatal hernia 07/2008  . Thyroid tumor      benign    There were no vitals filed for this visit.          [facility] PT Assessment - 04/01/16 0001    Assessment   Medical Diagnosis unsteadiness of gait   Prior Function   Level of Independence Independent with basic ADLs  has a shower bench, dresses self    Vocation Retired   Advice worker Deficits   Overall Strength Comments Bil knees 4+/5, DF 5/5, hip flexion 4/5 bil.                       [facility] [facility] PT Treatment/Exercise - 04/01/16 0001    Exercises   Exercises Ankle   Knee/Hip Exercises: Aerobic   Nustep L2 10 min  arms and seat #9   Knee/Hip Exercises: Standing   Hip Abduction Stengthening;Both;2 sets;10 reps   Knee/Hip Exercises: Seated   Long Arc Quad Right;Left;1  set;15 reps;Weights   Marching Strengthening;Both;2 sets;10 reps   Abduction/Adduction  Strengthening;Left;20 reps   Sit to [employer] 5 reps;without UE support   Ankle Exercises: Seated   Heel Raises 20 reps   Toe Raise 20 reps                PT Education - 04/01/16 1035    Education provided Yes   Education Details seated strength   Person(s) Educated Patient;Spouse   Methods Explanation;Demonstration;Handout   Comprehension Verbalized understanding;Returned demonstration          PT Short Term Goals - 03/18/16 0935    PT SHORT TERM GOAL #1   Title The patient will use rolling walker at home and the community for safety secondary to high risk for falls   02/04/16   Time 4   Period Weeks   Status Achieved   PT SHORT TERM GOAL #2   Title BERG balance score improved from 34/56 to 38/56 indicating improved standing and gait safety   Time 4   Period Weeks   Status Achieved   PT SHORT TERM GOAL #3   Title The patient will be able to stand for 15 min  for light meal prep   Time 4   Period Weeks   Status Achieved   PT SHORT TERM GOAL #4   Title TUG test improved to 20 sec indicating improved gait speed  As of June 7,  19sec   Time 4   Period Weeks   Status Achieved           PT Long Term Goals - April 25, 2016 1022    PT LONG TERM GOAL #1   Title The patient will be independent in HEP for strengthening of LEs and balance training   03/03/16   Status Achieved   PT LONG TERM GOAL #2   Title The patient will have gross LE strength 4+/5 needed to descend steps safely at home and church   Status Partially Met   PT LONG TERM GOAL #3   Title The patient will have improved TUG score to 15 sec indicating improved gait speed and LE strength   Status Partially Met  20 seconds   PT LONG TERM GOAL #5   Title BERG balance test improved to 40/56 indicating improved dynamic balance with narrow base of support   Status Achieved   PT LONG TERM GOAL #6   Title Six minute walk test  with RW 400 feet with supervision needed for community ambulation   Status Achieved               Plan - 04-25-2016 1031    Clinical Impression Statement Pt will D/C to HEP.  TUG was 20 seconds today with use of the walker.  PT reviewed HEP with pt and stressed importance of compliance with HEP for continued gains.  See goals for final goal status.     PT Next Visit Plan D/C PT to HEP   Consulted and Agree with Plan of Care Patient      Patient will benefit from skilled therapeutic intervention in order to improve the following deficits and impairments:     Visit Diagnosis: Muscle weakness (generalized)  Repeated falls  Other abnormalities of gait and mobility       G-Codes - 04/25/2016 1031    Functional Assessment Tool Used TUG, and clinical   Functional Limitation Mobility: Walking and moving around   Mobility: Walking and Moving Around Goal Status [phone]) At least 40 percent but less than 60 percent impaired, limited or restricted   Mobility: Walking and Moving Around Discharge Status [phone]) At least 40 percent but less than 60 percent impaired, limited or restricted      Problem List Patient Active Problem List   Diagnosis Date Noted  . Transient alteration of awareness 12/31/2015  . Memory disorder 12/31/2015  . Abnormality of gait 12/31/2015   PHYSICAL THERAPY DISCHARGE SUMMARY  Visits from Start of Care: 20  Current functional level related to goals / functional outcomes: See above for goal status   Remaining deficits:  Chronic balance, gait, endurance and strength deficits.  Pt has HEP in place for continued gains.  PT emphasized importance of compliance with HEP.     Education / Equipment: HEP Plan: Patient agrees to discharge.  Patient goals were partially met. Patient is being discharged due to being pleased with the current functional level.  ?????      Sigurd Sos, PT April 25, 2016 10:51 AM  [facility] [address], [place], [place], [address] Phone: [phone]   Fax:  [phone]  Name: Christine Webster MRN: [id number] Date of Birth: 1941-02-17

## 2016-05-09 ENCOUNTER — Other Ambulatory Visit: Payer: Self-pay | Admitting: Neurology

## 2016-05-25 ENCOUNTER — Other Ambulatory Visit: Payer: Self-pay | Admitting: Family Medicine

## 2016-05-26 ENCOUNTER — Other Ambulatory Visit: Payer: Self-pay | Admitting: Family Medicine

## 2016-05-26 DIAGNOSIS — R239 Unspecified skin changes: Secondary | ICD-10-CM

## 2016-05-26 IMAGING — US US THYROID BIOPSY
1 series · 13 of 17 positions shown · non-contrast
Comparison: Thyroid ultrasound - 12/30/2015

MEDICATIONS:
5 cc 1% lidocaine x 2

COMPLICATIONS:
None immediate.

INDICATION: Indeterminate thyroid nodules

Right thyroid nodule 3.8 cm
Left thyroid nodule 1.9 cm
EXAM:
ULTRASOUND GUIDED THYROID FINE NEEDLE ASPIRATION x2
TECHNIQUE: Informed written consent was obtained from the patient after a
discussion of the risks, benefits and alternatives to treatment.
Questions regarding the procedure were encouraged and answered. A
timeout was performed prior to the initiation of the procedure.

[Series 1: us thyroid biopsy · 0.08mm/px · 17 acquisitions, 13 frames shown]
[im 1/17]
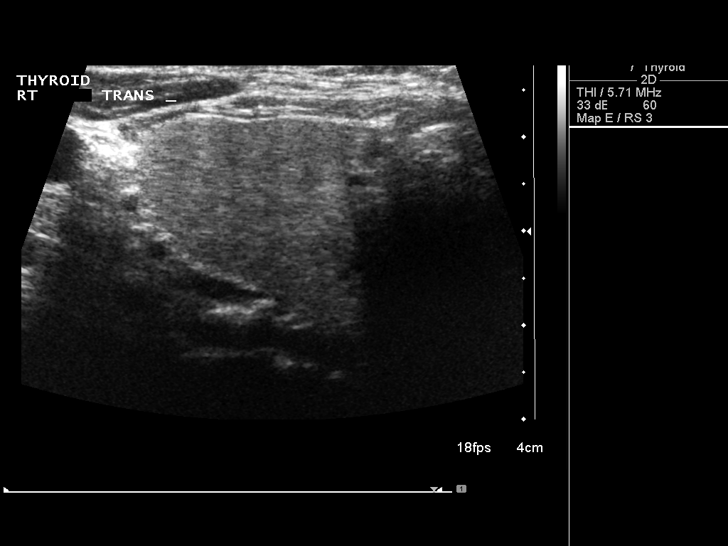
[im 2/17]
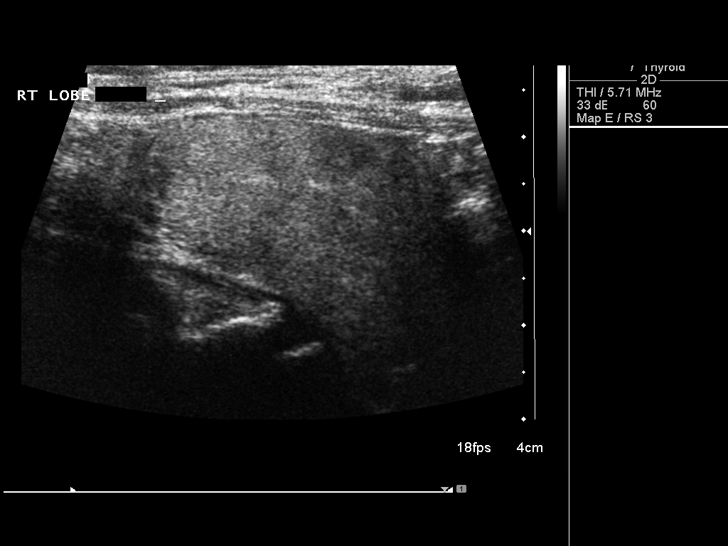
[im 4/17]
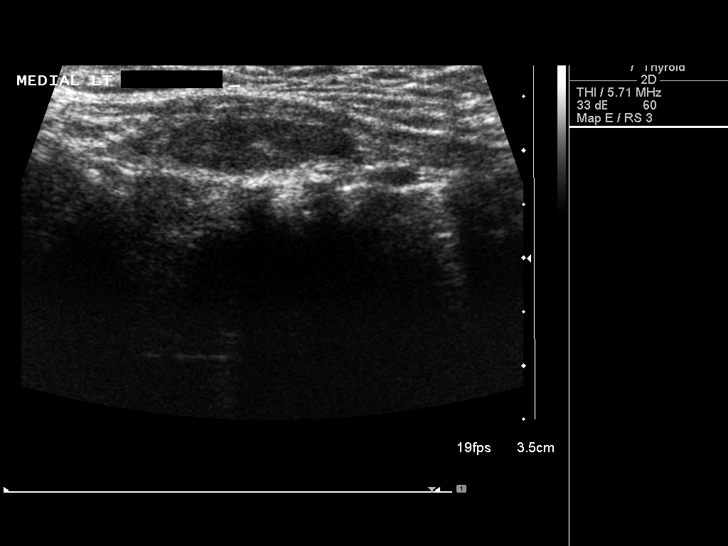
[im 5/17]
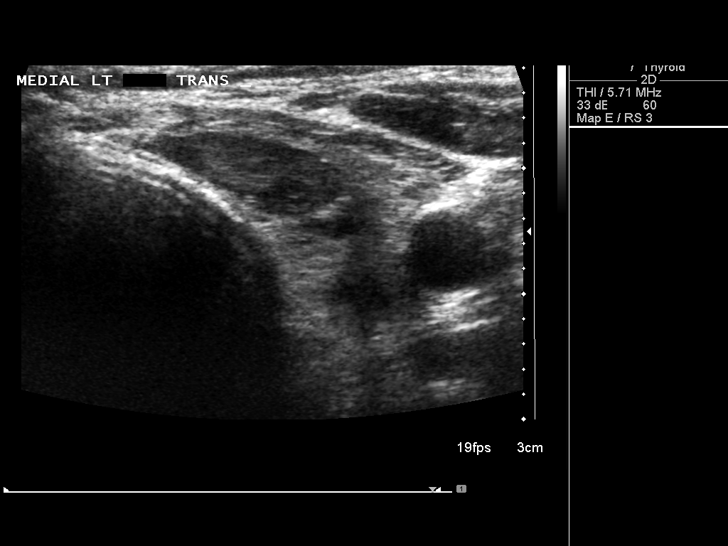
[im 6/17]
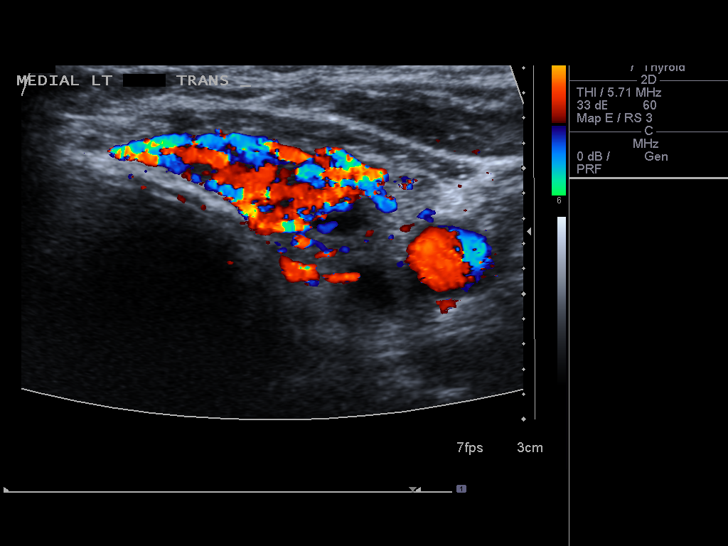
[im 8/17]
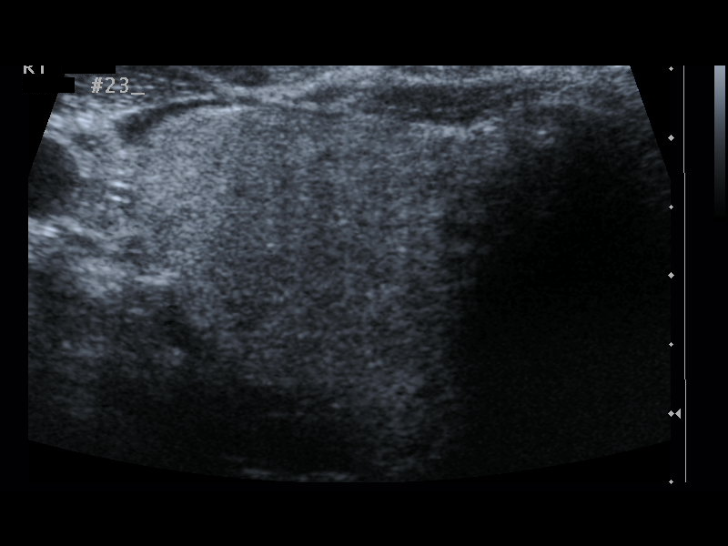
[im 9/17]
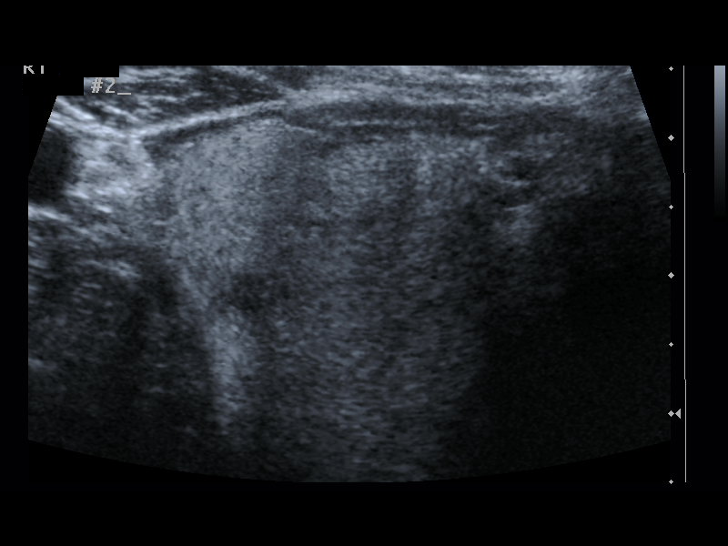
[im 10/17]
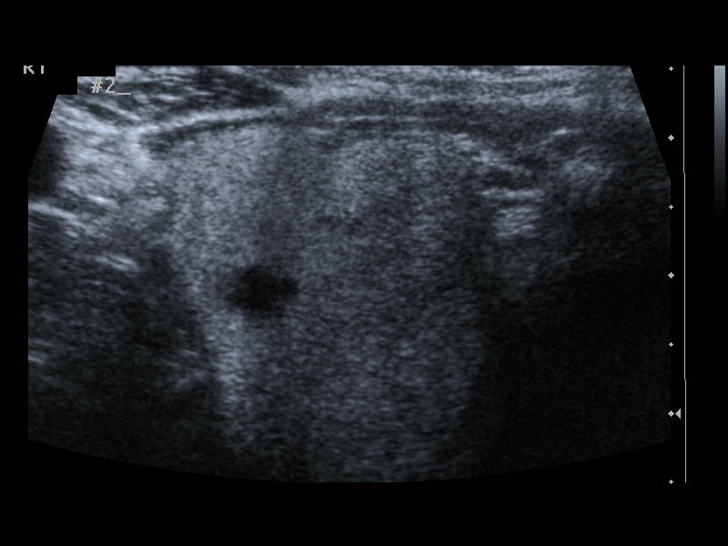
[im 12/17]
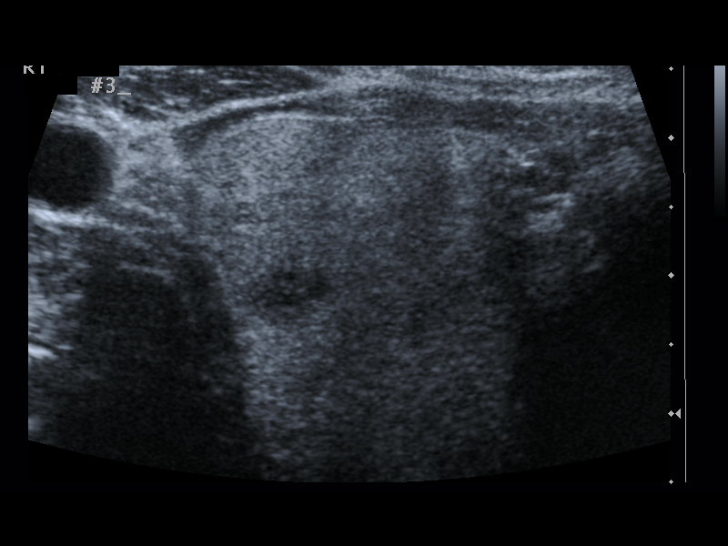
[im 13/17]
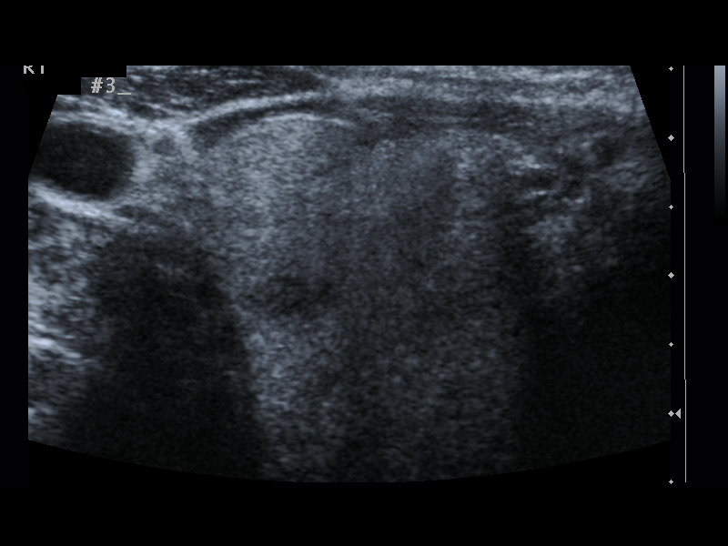
[im 14/17]
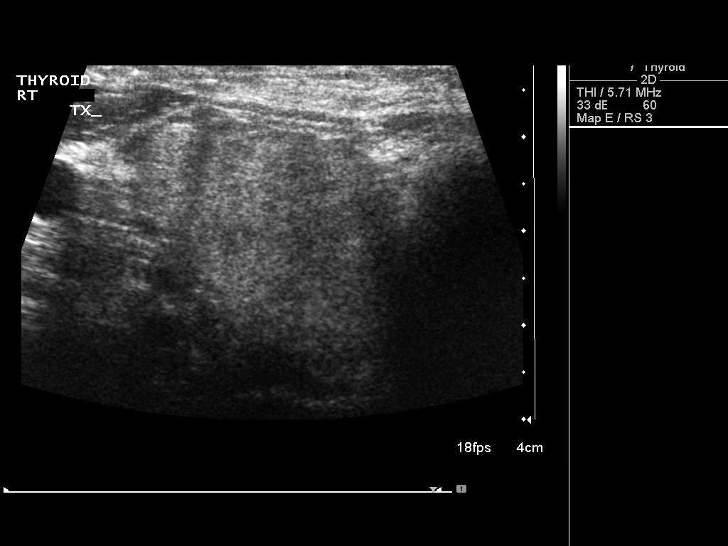
[im 16/17]
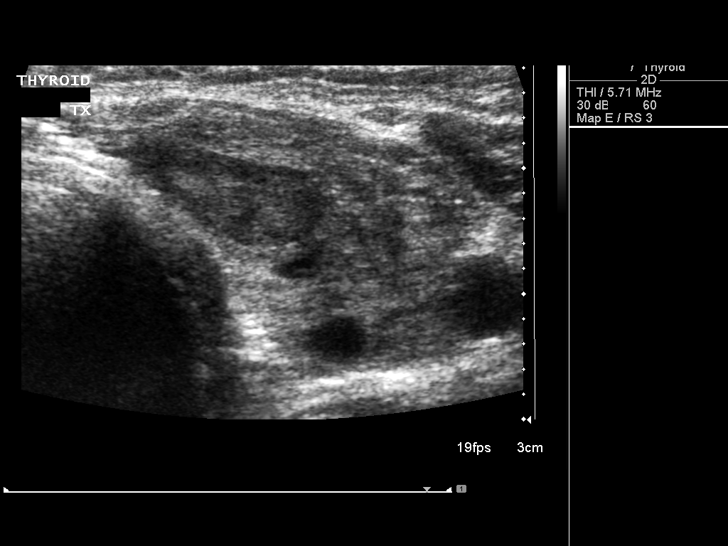
[im 17/17]
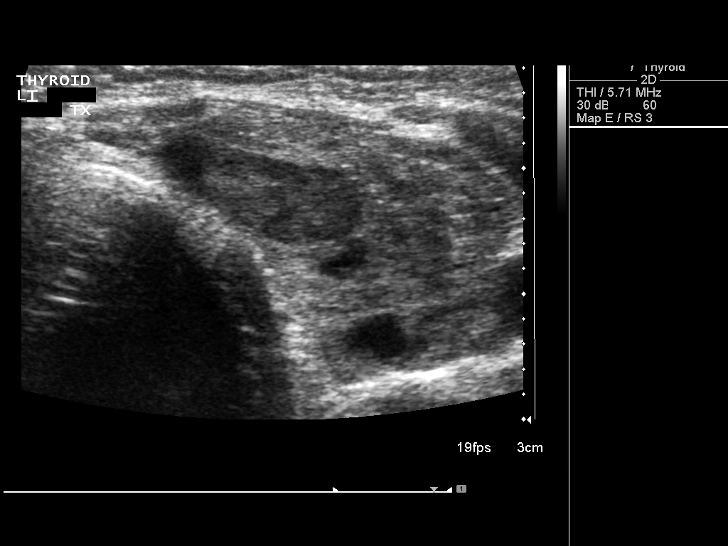

[13 of 17 positions shown; findings below may reference images not displayed]

Pre-procedural ultrasound scanning demonstrated Bilateral thyroid
nodules

The procedures were planned. The neck was prepped in the usual
sterile fashion, and a sterile drape was applied covering the
operative field. A timeout was performed prior to the initiation of
the procedure. Local anesthesia was provided with 1% lidocaine.

Under direct ultrasound guidance, 3 FNA biopsies were performed of
the left thyroid nodule with a 25 gauge needle. The samples were
prepared and submitted to pathology.

Under direct ultrasound guidance, 3 FNA biopsies were performed of
the right thyroid nodule with a 25 gauge needle. The samples were
prepared and submitted to pathology.

Limited post procedural scanning was negative for hematoma or
additional complication. Dressings were placed. The patient
tolerated the above procedures procedure well without immediate
postprocedural complication.
IMPRESSION: 1. Technically successful ultrasound guided fine needle aspiration
of dominant approximately 3.8 cm right-sided thyroid nodule/mass.
2. Technically successful ultrasound-guided fine-needle aspiration
of dominant approximately 1.9 cm left-sided thyroid nodule.
Read by:  Rinnah Tiger

## 2016-05-28 ENCOUNTER — Telehealth: Payer: Self-pay | Admitting: Neurology

## 2016-05-28 NOTE — Telephone Encounter (Signed)
I have her see blood work results done on 05/25/2016 from the primary care physician. The patient has a hemoglobin A1c of 6.2, chemistry panel with a BUN of 12, creatinine 0.78, sodium 139, potassium 4.6 chloride 105 CO2 of 29 calcium 11.2 which is slightly elevated total protein of 7.0, albumin of 4.1 liver profile is unremarkable and the TSH is 3.7 fourth vitamin D level of 23.2. The patient has been seen for memory problems, the slightly high calcium level may potentially be a contributing factor.

## 2016-05-29 ENCOUNTER — Other Ambulatory Visit: Payer: Medicare Other

## 2016-06-05 ENCOUNTER — Ambulatory Visit (INDEPENDENT_AMBULATORY_CARE_PROVIDER_SITE_OTHER): Payer: Medicare Other | Admitting: Neurology

## 2016-06-05 ENCOUNTER — Encounter: Payer: Self-pay | Admitting: Neurology

## 2016-06-05 VITALS — BP 134/90 | HR 71 | Ht 67.0 in | Wt 180.5 lb

## 2016-06-05 DIAGNOSIS — M6281 Muscle weakness (generalized): Secondary | ICD-10-CM

## 2016-06-05 DIAGNOSIS — R413 Other amnesia: Secondary | ICD-10-CM

## 2016-06-05 DIAGNOSIS — R269 Unspecified abnormalities of gait and mobility: Secondary | ICD-10-CM

## 2016-06-05 MED ORDER — DONEPEZIL HCL 10 MG PO TABS: 10.0000 mg | ORAL_TABLET | Freq: Every day | ORAL | 3 refills | Status: DC

## 2016-06-05 NOTE — Progress Notes (Signed)
Reason for visit: Memory disturbance  Christine Webster is an 75 y.o. female  History of present illness:  Christine Webster is a 75 year old right-handed white female with a history of a progressive memory disorder, and with a sensation of weakness in the legs, difficulty with walking. The patient currently is on Aricept 5 mg at night, she is tolerating the drug well. The patient continues to have a sensation of fatigue in the legs, if she is up on her feet for more than 15 minutes she feels as if her legs are going to collapse, she needs to sit down and rest. She does not have any balance problems per se, she does not stagger or fall, but she feels as if her legs cannot hold her up. The patient has had blood work done through the primary care physician which showed an elevated calcium level of 11.2, the patient also had an elevated total protein level, liver enzymes were normal. The patient is on a statin drug for cholesterol, but she denies any muscle pains, she denies back pain. The patient returns to this office for an evaluation.  Past Medical History:  Diagnosis Date  . Abnormality of gait 12/31/2015  . Colon polyps   . Depression   . Diabetes mellitus without complication ([facility])   . Genital herpes   . GERD (gastroesophageal reflux disease)   . Hyperlipidemia   . Hyperparathyroidism ([facility])    Dr. Talmage Nap  . Hypertension   . Kidney stones   . Memory disorder 12/31/2015  . Obesity   . Vitamin D deficiency   . Vitamin D deficiency     Past Surgical History:  Procedure Laterality Date  . INTESTINAL MALROTATION REPAIR    . NISSEN FUNDOPLICATION     for huge hiatal hernia 07/2008  . thyroid tumor     benign    Family History  Problem Relation Age of Onset  . Sleep apnea Father   . Alcohol abuse Father   . Stroke Father   . Dementia Mother   . Parkinsonism Sister     Social history:  reports that she has never smoked. She has never used smokeless tobacco. She reports  that she does not drink alcohol or use drugs.   No Known Allergies  Medications:  Prior to Admission medications   Medication Sig Start Date End Date Taking? Authorizing Provider  aspirin 325 MG tablet Take 325 mg by mouth daily.   Yes Historical Provider, Webster  atorvastatin (LIPITOR) 10 MG tablet Take 10 mg by mouth daily. Reported on 01/07/2016   Yes Historical Provider, Webster  buPROPion (WELLBUTRIN XL) 150 MG 24 hr tablet Take 150 mg by mouth daily. Reported on 01/07/2016 07/22/15  Yes Historical Provider, Webster  donepezil (ARICEPT) 5 MG tablet TAKE ONE TABLET (5 MG TOTAL) BY MOUTH AT BEDTIME. 05/11/16  Yes York Spaniel, Webster  esomeprazole (NEXIUM) 40 MG capsule Take 40 mg by mouth 2 (two) times daily before a meal.    Yes Historical Provider, Webster  FLUoxetine (PROZAC) 20 MG capsule Take 20 mg by mouth daily.   Yes Historical Provider, Webster  ibuprofen (ADVIL,MOTRIN) 200 MG tablet Take 200 mg by mouth every 6 (six) hours as needed.   Yes Historical Provider, Webster  Vitamin D, Ergocalciferol, (DRISDOL) 50000 units CAPS capsule  05/27/16  Yes Historical Provider, Webster    ROS:  Out of a complete 14 system review of symptoms, the patient complains only of the following symptoms, and all  other reviewed systems are negative.  Restless legs Walking difficulty Confusion, decreased concentration Hearing loss  Blood pressure 134/90, pulse 71, height 5\' 7"  (1.702 m), weight 180 lb 8 oz (81.9 kg).   Blood pressure, sitting, right arm is 128/84. Blood pressure, standing, right arm is 128 systolic.  Physical Exam  General: The patient is alert and cooperative at the time of the examination.  Skin: No significant peripheral edema is noted.   Neurologic Exam  Mental status: The patient is alert and oriented x 3 at the time of the examination. The patient has apparent normal recent and remote memory, with an apparently normal attention span and concentration ability. Mini-Mental Status Examination done today  shows a total score of 27/30.   Cranial nerves: Facial symmetry is present. Speech is normal, no aphasia or dysarthria is noted. Extraocular movements are full. Visual fields are full.  Motor: The patient has good strength in all 4 extremities.  Sensory examination: Soft touch sensation is symmetric on the face, arms, and legs.  Coordination: The patient has good finger-nose-finger and heel-to-shin bilaterally. The patient has mild apraxia with the use of the extremities.  Gait and station: The patient has a normal gait. Romberg is negative. No drift is seen.  Reflexes: Deep tendon reflexes are symmetric.   Assessment/Plan:  1. Memory disturbance  2. Gait disturbance  The patient will be increased on the Aricept to the 10 mg dose. A prescription was called in. She will contact our office if she is not tolerating the medication. The patient continues to report some sensation of fatigue of the legs. Further blood work will be done. The patient does have an elevated calcium level which may cause confusion and weakness. Given the increased total protein level, I will check a serum immunoelectrophoresis. The patient will follow-up in 6 months, sooner if needed.  Christine Webster. Christine Webster 06/05/2016 9:32 AM  [facility] [address] [place], [place] [id number]  Phone [phone] Fax [phone]

## 2016-06-09 ENCOUNTER — Telehealth: Payer: Self-pay | Admitting: Neurology

## 2016-06-09 LAB — MULTIPLE MYELOMA PANEL, SERUM
ALBUMIN SERPL ELPH-MCNC: 3.8 g/dL (ref 2.9–4.4)
Albumin/Glob SerPl: 1.2 (ref 0.7–1.7)
Alpha 1: 0.3 g/dL (ref 0.0–0.4)
Alpha2 Glob SerPl Elph-Mcnc: 0.8 g/dL (ref 0.4–1.0)
B-GLOBULIN SERPL ELPH-MCNC: 1.2 g/dL (ref 0.7–1.3)
Gamma Glob SerPl Elph-Mcnc: 1 g/dL (ref 0.4–1.8)
Globulin, Total: 3.2 g/dL (ref 2.2–3.9)
IGA/IMMUNOGLOBULIN A, SERUM: 142 mg/dL (ref 64–422)
IGM (IMMUNOGLOBULIN M), SRM: 105 mg/dL (ref 26–217)
IgG (Immunoglobin G), Serum: 944 mg/dL (ref 700–1600)
TOTAL PROTEIN: 7 g/dL (ref 6.0–8.5)

## 2016-06-09 LAB — CK: CK TOTAL: 36 U/L (ref 24–173)

## 2016-06-09 LAB — ACETYLCHOLINE RECEPTOR, BINDING: AChR Binding Ab, Serum: <0.03 nmol/L (ref 0.00–0.24)

## 2016-06-09 LAB — MAGNESIUM: Magnesium: 2.7 mg/dL — ABNORMAL HIGH (ref 1.6–2.3)

## 2016-06-09 LAB — SEDIMENTATION RATE: SED RATE: 2 mm/h (ref 0–40)

## 2016-06-09 NOTE — Telephone Encounter (Signed)
I called the patient, talk with the husband. Blood work was unremarkable with exception that the magnesium level was elevated. The patient also has elevated calcium level, these 2 issues may be what is causing the muscle weakness and fatigue. Etiology of this is not clear, the patient could have Paget's disease, she will be seen by an endocrinologist in the near future.  I will send the blood work to the primary care physician as well.

## 2016-06-16 ENCOUNTER — Ambulatory Visit
Admission: RE | Admit: 2016-06-16 | Discharge: 2016-06-16 | Disposition: A | Discharge status: Home / Self Care | Payer: Medicare Other | Source: Ambulatory Visit | Attending: Family Medicine | Admitting: Family Medicine

## 2016-06-16 DIAGNOSIS — R239 Unspecified skin changes: Secondary | ICD-10-CM

## 2016-09-09 ENCOUNTER — Ambulatory Visit: Payer: Medicare Other | Attending: Family Medicine | Admitting: Physical Therapy

## 2016-09-09 ENCOUNTER — Encounter: Payer: Self-pay | Admitting: Physical Therapy

## 2016-09-09 DIAGNOSIS — M6281 Muscle weakness (generalized): Secondary | ICD-10-CM

## 2016-09-09 DIAGNOSIS — R262 Difficulty in walking, not elsewhere classified: Secondary | ICD-10-CM | POA: Diagnosis present

## 2016-09-09 NOTE — Therapy (Signed)
[facility] [address], [address] [place], [place], [address] Phone: [phone]   Fax:  [phone]  Physical Therapy Evaluation  Patient Details  Name: Christine Webster MRN: [id number] Date of Birth: Mar 16, 1941 Referring Provider: Juluis RainierElizabeth Barnes  Encounter Date: 09/09/2016      PT End of Session - 09/09/16 0935    Visit Number 1   Number of Visits 10   Date for PT Re-Evaluation 11/04/16   Authorization Type g codes after 10th visit   PT Start Time 0926   PT Stop Time 1012   PT Time Calculation (min) 46 min   Activity Tolerance Patient tolerated treatment well   Behavior During Therapy [facility] for tasks assessed/performed      Past Medical History:  Diagnosis Date  . Abnormality of gait 12/31/2015  . Colon polyps   . Depression   . Diabetes mellitus without complication ([facility])   . Genital herpes   . GERD (gastroesophageal reflux disease)   . Hyperlipidemia   . Hyperparathyroidism ([facility])    Dr. Talmage NapBalan  . Hypertension   . Kidney stones   . Memory disorder 12/31/2015  . Obesity   . Vitamin D deficiency   . Vitamin D deficiency     Past Surgical History:  Procedure Laterality Date  . INTESTINAL MALROTATION REPAIR    . NISSEN FUNDOPLICATION     for huge hiatal hernia 07/2008  . thyroid tumor     benign    There were no vitals filed for this visit.       Subjective Assessment - 09/09/16 0931    Subjective Pt states she has a hard time keeping her balance.  States " I think I had that before.  I'm not sure"   Limitations Walking;Standing   How long can you stand comfortably? standing for about 5 minutes and legs feel like they start to give way   Currently in Pain? No/denies            [facility] PT Assessment - 09/09/16 0001      Assessment   Medical Diagnosis R26.81 unsteady on feet   Referring Provider Juluis RainierElizabeth Barnes   Prior Therapy YES     Precautions   Precautions Fall     Restrictions    Weight Bearing Restrictions No     Balance Screen   Has the patient fallen in the past 6 months --  cannot remember, fallen in the shower   Has the patient had a decrease in activity level because of a fear of falling?  No   Is the patient reluctant to leave their home because of a fear of falling?  No     Home Environment   Living Environment Private residence   Living Arrangements Spouse/significant other   Type of Home House   Home Access Stairs to enter   Entrance Stairs-Number of Steps 2-3   Entrance Stairs-Rails Right;Left   Home Layout One level   Home Equipment Walker - 2 wheels   Additional Comments church steps 25 almost fell coming down     Prior Function   Level of Independence Independent with gait  RW   Vocation Retired     IT consultantCognition   Overall Cognitive Status No family/caregiver present to determine baseline cognitive functioning   Memory Impaired     Posture/Postural Control   Posture/Postural Control Postural limitations   Postural Limitations Rounded Shoulders;Forward head;Flexed trunk     Strength   Overall Strength Comments LE  overall 4/5   Right Knee Extension 4-/5   Left Knee Extension 4-/5     Transfers   Five time sit to stand comments  35 sec; has difficulty with core stability and uses holding breath with standing for core stabilization     Ambulation/Gait   Assistive device Rolling walker   Gait Pattern Step-through pattern   Ambulation Surface Level     Standardized Balance Assessment   Five times sit to stand comments  35 sec     Berg Balance Test   Sit to Stand Able to stand without using hands and stabilize independently   Standing Unsupported Able to stand safely 2 minutes   Sitting with Back Unsupported but Feet Supported on Floor or Stool Able to sit safely and securely 2 minutes   Stand to Sit Sits safely with minimal use of hands   Transfers Able to transfer safely, minor use of hands   Standing Unsupported with Eyes Closed  Able to stand 10 seconds safely   Standing Ubsupported with Feet Together Able to place feet together independently and stand 1 minute safely   From Standing, Reach Forward with Outstretched Arm Can reach forward >12 cm safely (5")   From Standing Position, Pick up Object from Floor Able to pick up shoe, needs supervision   From Standing Position, Turn to Look Behind Over each Shoulder Turn sideways only but maintains balance   Turn 360 Degrees Able to turn 360 degrees safely but slowly   Standing Unsupported, Alternately Place Feet on Step/Stool Able to complete >2 steps/needs minimal assist   Standing Unsupported, One Foot in Front Needs help to step but can hold 15 seconds   Standing on One Leg Tries to lift leg/unable to hold 3 seconds but remains standing independently   Total Score 41     Timed Up and Go Test   Normal TUG (seconds) 22.8   TUG Comments RW                             PT Short Term Goals - 09/09/16 2238      PT SHORT TERM GOAL #1   Title pt will do initial HEP with supervision from her spouse   Time 4   Period Weeks   Status New     PT SHORT TERM GOAL #2   Title Berg balance improve to 43/56 for reduced risk of falls   Time 4   Period Weeks   Status New     PT SHORT TERM GOAL #3   Title The patient will be able to stand for 15 min for light meal prep without feeling of legs giving way   Time 4   Period Weeks   Status New     PT SHORT TERM GOAL #4   Title TUG test improved to 20 sec indicating improved gait speed   Time 4   Period Weeks   Status New           PT Long Term Goals - 09/09/16 2248      PT LONG TERM GOAL #1   Title The patient will be independent in HEP for strengthening of LEs and balance training   Time 8   Period Weeks   Status New     PT LONG TERM GOAL #2   Title The patient will have gross LE strength 4+/5 needed to descend steps safely at home and church  Time 8   Period Weeks   Status New     PT  LONG TERM GOAL #3   Title The patient will have improved TUG score to 16 sec indicating improved gait speed and LE strength   Time 8   Period Weeks   Status New     PT LONG TERM GOAL #4   Title Sit to stand 5x improved to 25 sec indicating improved LE strength and mobility   Time 8   Period Weeks   Status Achieved     PT LONG TERM GOAL #5   Title BERG balance test improved to 45/56 indicating improved dynamic balance with narrow base of support   Time 8   Period Weeks   Status New               Plan - 09/09/16 2251    Clinical Impression Statement Patient demonstrates decreased strength and endurance of LE as well as balance assessments demonstrating increased risk of falls such as TUG 22.8, 5 times si to stand and Berg 41/56.  Pt needs skilled PT to improve balance, LE strength and endurance in order to contine to stay active in the community with reduced risk of falls    Rehab Potential Good   Clinical Impairments Affecting Rehab Potential memory difficulty   PT Frequency 2x / week   PT Duration 8 weeks   PT Treatment/Interventions Therapeutic activities;Therapeutic exercise;Patient/family education;Neuromuscular re-education;Balance training;Gait training;Stair training;Manual techniques   PT Next Visit Plan balance, LE strengthening   Recommended Other Services none   Consulted and Agree with Plan of Care Patient      Patient will benefit from skilled therapeutic intervention in order to improve the following deficits and impairments:  Abnormal gait, Improper body mechanics, Decreased coordination, Decreased activity tolerance, Decreased balance, Decreased endurance, Decreased strength, Difficulty walking  Visit Diagnosis: Difficulty in walking, not elsewhere classified - Plan: PT plan of care cert/re-cert  Muscle weakness (generalized) - Plan: PT plan of care cert/re-cert      G-Codes - 09/09/16 2236    Functional Assessment Tool Used Berg balance, TUG, and  clinical reasoning   Functional Limitation Mobility: Walking and moving around   Mobility: Walking and Moving Around Current Status [phone]([id number]) At least 40 percent but less than 60 percent impaired, limited or restricted   Mobility: Walking and Moving Around Goal Status [phone]([id number]) At least 20 percent but less than 40 percent impaired, limited or restricted       Problem List Patient Active Problem List   Diagnosis Date Noted  . Transient alteration of awareness 12/31/2015  . Memory disorder 12/31/2015  . Abnormality of gait 12/31/2015    [name], PT 09/09/2016, 11:03 PM  [facility] [address], [address] [place], [place], [address] Phone: [phone]   Fax:  [phone]  Name: Christine Webster MRN: [id number] Date of Birth: Dec 01, 1940

## 2016-09-14 ENCOUNTER — Other Ambulatory Visit: Payer: Self-pay

## 2016-09-14 MED ORDER — DONEPEZIL HCL 10 MG PO TABS: 10.0000 mg | ORAL_TABLET | Freq: Every day | ORAL | 3 refills | Status: AC

## 2016-09-14 NOTE — Telephone Encounter (Signed)
90 day refills e-scribed per faxed request from pharmacy. 

## 2016-09-15 ENCOUNTER — Encounter: Payer: Self-pay | Admitting: Physical Therapy

## 2016-09-15 ENCOUNTER — Ambulatory Visit: Payer: Medicare Other | Attending: Family Medicine | Admitting: Physical Therapy

## 2016-09-15 DIAGNOSIS — M6281 Muscle weakness (generalized): Secondary | ICD-10-CM | POA: Insufficient documentation

## 2016-09-15 DIAGNOSIS — R296 Repeated falls: Secondary | ICD-10-CM | POA: Insufficient documentation

## 2016-09-15 DIAGNOSIS — R2689 Other abnormalities of gait and mobility: Secondary | ICD-10-CM | POA: Diagnosis present

## 2016-09-15 DIAGNOSIS — R262 Difficulty in walking, not elsewhere classified: Secondary | ICD-10-CM | POA: Diagnosis not present

## 2016-09-15 NOTE — Therapy (Signed)
[facility] [address] [place], [place], [address] Phone: [phone]   Fax:  [phone]  Physical Therapy Treatment  Patient Details  Name: Christine Webster MRN: [id number] Date of Birth: [birth year] Referring Provider: Juluis RainierElizabeth Webster  Encounter Date: 09/15/2016      PT End of Session - 09/15/16 1038    Visit Number 2   Number of Visits 10   Date for PT Re-Evaluation 11/04/16   Authorization Type g codes after 10th visit   PT Start Time 1015   PT Stop Time 1055   PT Time Calculation (min) 40 min   Activity Tolerance Patient tolerated treatment well   Behavior During Therapy [name]      Past Medical History:  Diagnosis Date  . Abnormality of gait 12/31/2015  . Colon polyps   . Depression   . Diabetes mellitus without complication (HCC)   . Genital herpes   . GERD (gastroesophageal reflux disease)   . Hyperlipidemia   . Hyperparathyroidism (HCC)    Dr. Talmage NapBalan  . Hypertension   . Kidney stones   . Memory disorder 12/31/2015  . Obesity   . Vitamin D deficiency   . Vitamin D deficiency     Past Surgical History:  Procedure Laterality Date  . INTESTINAL MALROTATION REPAIR    . NISSEN FUNDOPLICATION     for huge hiatal hernia 07/2008  . thyroid tumor     benign    There were no vitals filed for this visit.      Subjective Assessment - 09/15/16 1024    Subjective Pt states she went to the church where there was an indoor track and she walked around this morning.   Limitations Walking;Standing   How long can you stand comfortably? standing for about 5 minutes and legs feel like they start to give way   Currently in Pain? No/denies                         [facility] Adult PT Treatment/Exercise - 09/15/16 0001      Knee/Hip Exercises: Aerobic   Nustep L1 x 10 minutes     Knee/Hip Exercises: Standing   Heel Raises Both;20 reps   Hip Flexion  Stengthening;Both;1 set;10 reps;Knee bent   Hip Abduction Stengthening;Both;1 set;10 reps;Knee straight  bilateral UE support   Hip Extension Stengthening;Both;1 set;10 reps;Knee straight  bilateral UE support   Other Standing Knee Exercises hamstring curls 10x on each side  bilat UE support     Knee/Hip Exercises: Seated   Sit to Sand 10 reps;with UE support  min UE support     Knee/Hip Exercises: Supine   Hip Adduction Isometric Strengthening;Both;5 reps  10 sec hold   Bridges Strengthening;Both;1 set;10 reps     Knee/Hip Exercises: Sidelying   Clams 10x each side red band                  PT Short Term Goals - 09/15/16 1049      PT SHORT TERM GOAL #1   Title pt will do initial HEP with supervision from her spouse   Time 4   Period Weeks   Status On-going     PT SHORT TERM GOAL #2   Title Berg balance improve to 43/56 for reduced risk of falls   Time 4   Period Weeks   Status On-going     PT SHORT TERM GOAL #3  Title The patient will be able to stand for 15 min for light meal prep without feeling of legs giving way   Time 4   Period Weeks   Status On-going     PT SHORT TERM GOAL #4   Title TUG test improved to 20 sec indicating improved gait speed   Time 4   Period Weeks   Status On-going           PT Long Term Goals - 09/09/16 2248      PT LONG TERM GOAL #1   Title The patient will be independent in HEP for strengthening of LEs and balance training   Time 8   Period Weeks   Status New     PT LONG TERM GOAL #2   Title The patient will have gross LE strength 4+/5 needed to descend steps safely at home and church   Time 8   Period Weeks   Status New     PT LONG TERM GOAL #3   Title The patient will have improved TUG score to 16 sec indicating improved gait speed and LE strength   Time 8   Period Weeks   Status New     PT LONG TERM GOAL #4   Title Sit to stand 5x improved to 25 sec indicating improved LE strength and mobility   Time  8   Period Weeks   Status Achieved     PT LONG TERM GOAL #5   Title BERG balance test improved to 45/56 indicating improved dynamic balance with narrow base of support   Time 8   Period Weeks   Status New               Plan - 09/15/16 1040    Clinical Impression Statement Pt needs verbal and tactile cues.  Pt has some processing issues with verbal cues and responds well to tactile cues.  Pt able to perform all tasks requested of her and demonstrates fatigue with exercises.  Performed standing exercises with bilateral UE support.  Cont to need skilled PT for increased strength and endurance in order to perform functional activities.   Rehab Potential Good   Clinical Impairments Affecting Rehab Potential memory difficulty   PT Frequency 2x / week   PT Duration 8 weeks   PT Treatment/Interventions Therapeutic activities;Therapeutic exercise;Patient/family education;Neuromuscular re-education;Balance training;Gait training;Stair training;Manual techniques   PT Next Visit Plan balance, LE strengthening   Consulted and Agree with Plan of Care Patient      Patient will benefit from skilled therapeutic intervention in order to improve the following deficits and impairments:  Abnormal gait, Improper body mechanics, Decreased coordination, Decreased activity tolerance, Decreased balance, Decreased endurance, Decreased strength, Difficulty walking  Visit Diagnosis: Difficulty in walking, not elsewhere classified  Muscle weakness (generalized)  Repeated falls  Other abnormalities of gait and mobility     Problem List Patient Active Problem List   Diagnosis Date Noted  . Transient alteration of awareness 12/31/2015  . Memory disorder 12/31/2015  . Abnormality of gait 12/31/2015    [name] 09/15/2016, 5:41 PM  [facility] [address], [address] [name], [place], [address] Phone: [phone]   Fax:   [phone]  Name: Christine Webster MRN: [id number] Date of Birth: 24-Oct-1940

## 2016-09-17 ENCOUNTER — Encounter: Payer: Self-pay | Admitting: Physical Therapy

## 2016-09-17 ENCOUNTER — Ambulatory Visit: Payer: Medicare Other | Admitting: Physical Therapy

## 2016-09-17 DIAGNOSIS — R262 Difficulty in walking, not elsewhere classified: Secondary | ICD-10-CM | POA: Diagnosis not present

## 2016-09-17 DIAGNOSIS — M6281 Muscle weakness (generalized): Secondary | ICD-10-CM

## 2016-09-17 NOTE — Therapy (Signed)
[facility] [address], [address] [place], [place], [address] Phone: [phone]   Fax:  [phone]  Physical Therapy Treatment  Patient Details  Name: Christine Webster MRN: [id number] Date of Birth: [birth year] Referring Provider: Juluis RainierElizabeth Barnes  Encounter Date: 09/17/2016      PT End of Session - 09/17/16 1538    Visit Number 3   Number of Visits 10   Date for PT Re-Evaluation 11/04/16   Authorization Type g codes after 10th visit   PT Start Time 1531   PT Stop Time 1611   PT Time Calculation (min) 40 min   Activity Tolerance Patient tolerated treatment well   Behavior During Therapy [facility] for tasks assessed/performed      Past Medical History:  Diagnosis Date  . Abnormality of gait 12/31/2015  . Colon polyps   . Depression   . Diabetes mellitus without complication (HCC)   . Genital herpes   . GERD (gastroesophageal reflux disease)   . Hyperlipidemia   . Hyperparathyroidism (HCC)    Dr. Talmage NapBalan  . Hypertension   . Kidney stones   . Memory disorder 12/31/2015  . Obesity   . Vitamin D deficiency   . Vitamin D deficiency     Past Surgical History:  Procedure Laterality Date  . INTESTINAL MALROTATION REPAIR    . NISSEN FUNDOPLICATION     for huge hiatal hernia 07/2008  . thyroid tumor     benign    There were no vitals filed for this visit.      Subjective Assessment - 09/17/16 1536    Subjective Pt reports having a little back soreness over the past few days but denies any other pain.    Limitations Walking;Standing   How long can you stand comfortably? standing for about 5 minutes and legs feel like they start to give way   Currently in Pain? No/denies   Multiple Pain Sites No                         [facility] Adult PT Treatment/Exercise - 09/17/16 0001      Lumbar Exercises: Stretches   Active Hamstring Stretch 2 reps;10 seconds     Knee/Hip Exercises: Aerobic   Nustep L1 x  10 minutes     Knee/Hip Exercises: Standing   Hip Abduction Stengthening;Both;1 set;10 reps;Knee straight  step out with weight shift   Hip Extension Stengthening;Both;1 set;10 reps;Knee straight  step out with weight shift   Forward Step Up Both;2 sets;10 reps     Knee/Hip Exercises: Seated   Long Arc Quad Strengthening;Both;2 sets;10 reps   Ball Squeeze 20x   Other Seated Knee/Hip Exercises Ball toss  for core stability   Marching Limitations 20x   Abduction/Adduction  Strengthening;Both;2 sets;20 reps  Red band   Sit to [employer] with UE support;5 reps  min UE support                  PT Short Term Goals - 09/15/16 1049      PT SHORT TERM GOAL #1   Title pt will do initial HEP with supervision from her spouse   Time 4   Period Weeks   Status On-going     PT SHORT TERM GOAL #2   Title Berg balance improve to 43/56 for reduced risk of falls   Time 4   Period Weeks   Status On-going     PT SHORT TERM GOAL #  3   Title The patient will be able to stand for 15 min for light meal prep without feeling of legs giving way   Time 4   Period Weeks   Status On-going     PT SHORT TERM GOAL #4   Title TUG test improved to 20 sec indicating improved gait speed   Time 4   Period Weeks   Status On-going           PT Long Term Goals - 09/09/16 2248      PT LONG TERM GOAL #1   Title The patient will be independent in HEP for strengthening of LEs and balance training   Time 8   Period Weeks   Status New     PT LONG TERM GOAL #2   Title The patient will have gross LE strength 4+/5 needed to descend steps safely at home and church   Time 8   Period Weeks   Status New     PT LONG TERM GOAL #3   Title The patient will have improved TUG score to 16 sec indicating improved gait speed and LE strength   Time 8   Period Weeks   Status New     PT LONG TERM GOAL #4   Title Sit to stand 5x improved to 25 sec indicating improved LE strength and mobility   Time 8    Period Weeks   Status Achieved     PT LONG TERM GOAL #5   Title BERG balance test improved to 45/56 indicating improved dynamic balance with narrow base of support   Time 8   Period Weeks   Status New               Plan - 09/17/16 1623    Clinical Impression Statement Pt presents with rolling walker and slow cadence. Pt able to tolerate all exercises well. Some difficulty with sit to stand. Pt will contiue to benefit from skilled thearpy for LE strength and balance.    Rehab Potential Good   Clinical Impairments Affecting Rehab Potential memory difficulty   PT Frequency 2x / week   PT Duration 8 weeks   PT Treatment/Interventions Therapeutic activities;Therapeutic exercise;Patient/family education;Neuromuscular re-education;Balance training;Gait training;Stair training;Manual techniques   PT Next Visit Plan balance, LE strengthening   Consulted and Agree with Plan of Care Patient      Patient will benefit from skilled therapeutic intervention in order to improve the following deficits and impairments:  Abnormal gait, Improper body mechanics, Decreased coordination, Decreased activity tolerance, Decreased balance, Decreased endurance, Decreased strength, Difficulty walking  Visit Diagnosis: Difficulty in walking, not elsewhere classified  Muscle weakness (generalized)     Problem List Patient Active Problem List   Diagnosis Date Noted  . Transient alteration of awareness 12/31/2015  . Memory disorder 12/31/2015  . Abnormality of gait 12/31/2015    Dessa PhiKatherine Jwan Hornbaker PTA 09/17/2016, 4:32 PM  [facility] [address], [address] [place], [place], [address] Phone: [phone]   Fax:  [phone]  Name: Christine Webster MRN: [id number] Date of Birth: 03/20/1941

## 2016-09-22 ENCOUNTER — Ambulatory Visit: Payer: Medicare Other | Admitting: Physical Therapy

## 2016-09-22 ENCOUNTER — Encounter: Payer: Self-pay | Admitting: Physical Therapy

## 2016-09-22 DIAGNOSIS — R262 Difficulty in walking, not elsewhere classified: Secondary | ICD-10-CM | POA: Diagnosis not present

## 2016-09-22 DIAGNOSIS — R296 Repeated falls: Secondary | ICD-10-CM

## 2016-09-22 DIAGNOSIS — M6281 Muscle weakness (generalized): Secondary | ICD-10-CM

## 2016-09-22 DIAGNOSIS — R2689 Other abnormalities of gait and mobility: Secondary | ICD-10-CM

## 2016-09-22 NOTE — Therapy (Signed)
[facility] [address], [address] [place], [place], [address] Phone: [phone]   Fax:  [phone]  Physical Therapy Treatment  Patient Details  Name: Christine PaxStella M Webster MRN: [id number] Date of Birth: 1941/10/01 Referring Provider: Juluis RainierElizabeth Barnes  Encounter Date: 09/22/2016      PT End of Session - 09/22/16 1023    Visit Number 4   Number of Visits 10   Date for PT Re-Evaluation 11/04/16   Authorization Type g codes after 10th visit   PT Start Time 1015   PT Stop Time 1055   PT Time Calculation (min) 40 min   Activity Tolerance Patient tolerated treatment well   Behavior During Therapy [facility] for tasks assessed/performed      Past Medical History:  Diagnosis Date  . Abnormality of gait 12/31/2015  . Colon polyps   . Depression   . Diabetes mellitus without complication (HCC)   . Genital herpes   . GERD (gastroesophageal reflux disease)   . Hyperlipidemia   . Hyperparathyroidism (HCC)    Dr. Talmage NapBalan  . Hypertension   . Kidney stones   . Memory disorder 12/31/2015  . Obesity   . Vitamin D deficiency   . Vitamin D deficiency     Past Surgical History:  Procedure Laterality Date  . INTESTINAL MALROTATION REPAIR    . NISSEN FUNDOPLICATION     for huge hiatal hernia 07/2008  . thyroid tumor     benign    There were no vitals filed for this visit.      Subjective Assessment - 09/22/16 1022    Subjective Pt denies any pain today and states she is feeling fine.     Limitations Walking;Standing   How long can you stand comfortably? standing for about 5 minutes and legs feel like they start to give way   Currently in Pain? No/denies                         [facility] Adult PT Treatment/Exercise - 09/22/16 0001      Lumbar Exercises: Stretches   Active Hamstring Stretch 2 reps;20 seconds     Lumbar Exercises: Seated   Long Arc Quad on Chair Strengthening;Right;Left;2 sets;15 reps  2lb    Sit to Stand 15 reps  3 sets of 5   Sit to Stand Limitations minimal use of hands     Knee/Hip Exercises: Aerobic   Nustep L1 x 10 minutes     Knee/Hip Exercises: Standing   Knee Flexion Strengthening;Both;10 reps  2lb   Hip Abduction Stengthening;Both;1 set;10 reps;Knee straight  step out with weight shift - started getting tired     Knee/Hip Exercises: Seated   Long Arc Quad Strengthening;Both;2 sets;15 reps  2lb   Other Seated Knee/Hip Exercises Ball toss - unsupported back  for core stability                  PT Short Term Goals - 09/15/16 1049      PT SHORT TERM GOAL #1   Title pt will do initial HEP with supervision from her spouse   Time 4   Period Weeks   Status On-going     PT SHORT TERM GOAL #2   Title Berg balance improve to 43/56 for reduced risk of falls   Time 4   Period Weeks   Status On-going     PT SHORT TERM GOAL #3   Title The patient will be  able to stand for 15 min for light meal prep without feeling of legs giving way   Time 4   Period Weeks   Status On-going     PT SHORT TERM GOAL #4   Title TUG test improved to 20 sec indicating improved gait speed   Time 4   Period Weeks   Status On-going           PT Long Term Goals - 09/22/16 1729      PT LONG TERM GOAL #1   Title The patient will be independent in HEP for strengthening of LEs and balance training   Period Weeks   Status On-going     PT LONG TERM GOAL #2   Title The patient will have gross LE strength 4+/5 needed to descend steps safely at home and church   Time 8   Period Weeks   Status On-going               Plan - 09/22/16 1731    Clinical Impression Statement Pt able to increase reps of sit to stand without too much hesitation or rest.  Pt did get fatigued after this activity however, and was not able to perform as much in standing afterwards.  She had to stop in the middle of an exercise due to feeling like she was going to fall.  Pt O2 sats and HR  were measure at this time as 97% and 78 respectively.  Pt denied dizziness and stated she just felt weak.  Pt will continue to benefit from silled PT for increased strength and endurance.   Rehab Potential Good   Clinical Impairments Affecting Rehab Potential memory difficulty   PT Frequency 2x / week   PT Duration 8 weeks   PT Treatment/Interventions Therapeutic activities;Therapeutic exercise;Patient/family education;Neuromuscular re-education;Balance training;Gait training;Stair training;Manual techniques   PT Next Visit Plan balance, LE strengthening   PT Home Exercise Plan discuss with husband if he is able to assist with exercises in standing at home   Consulted and Agree with Plan of Care Patient      Patient will benefit from skilled therapeutic intervention in order to improve the following deficits and impairments:  Abnormal gait, Improper body mechanics, Decreased coordination, Decreased activity tolerance, Decreased balance, Decreased endurance, Decreased strength, Difficulty walking  Visit Diagnosis: Difficulty in walking, not elsewhere classified  Muscle weakness (generalized)  Repeated falls  Other abnormalities of gait and mobility     Problem List Patient Active Problem List   Diagnosis Date Noted  . Transient alteration of awareness 12/31/2015  . Memory disorder 12/31/2015  . Abnormality of gait 12/31/2015    [name], PT 09/22/2016, 5:35 PM  [facility] [address], [address] [place], [place], [address] Phone: [phone]   Fax:  [phone]  Name: Christine PaxStella M Pech MRN: [id number] Date of Birth: [birth year]

## 2016-09-24 ENCOUNTER — Encounter: Payer: Medicare Other | Admitting: Physical Therapy

## 2016-09-29 ENCOUNTER — Encounter: Payer: Self-pay | Admitting: Physical Therapy

## 2016-09-29 ENCOUNTER — Ambulatory Visit: Payer: Medicare Other | Admitting: Physical Therapy

## 2016-09-29 DIAGNOSIS — R262 Difficulty in walking, not elsewhere classified: Secondary | ICD-10-CM | POA: Diagnosis not present

## 2016-09-29 DIAGNOSIS — M6281 Muscle weakness (generalized): Secondary | ICD-10-CM

## 2016-09-29 NOTE — Therapy (Signed)
[facility] [address], [address] [place], [place], [address] Phone: [phone]   Fax:  [phone]  Physical Therapy Treatment  Patient Details  Name: Christine Webster MRN: [id number] Date of Birth: 1940/11/17 Referring Provider: Juluis RainierElizabeth Barnes  Encounter Date: 09/29/2016      PT End of Session - 09/29/16 1018    Visit Number 5   Number of Visits 10   Date for PT Re-Evaluation 11/04/16   Authorization Type g codes after 10th visit   PT Start Time 1015   PT Stop Time 1054   PT Time Calculation (min) 39 min   Activity Tolerance Patient tolerated treatment well   Behavior During Therapy [facility] for tasks assessed/performed      Past Medical History:  Diagnosis Date  . Abnormality of gait 12/31/2015  . Colon polyps   . Depression   . Diabetes mellitus without complication (HCC)   . Genital herpes   . GERD (gastroesophageal reflux disease)   . Hyperlipidemia   . Hyperparathyroidism (HCC)    Dr. Talmage NapBalan  . Hypertension   . Kidney stones   . Memory disorder 12/31/2015  . Obesity   . Vitamin D deficiency   . Vitamin D deficiency     Past Surgical History:  Procedure Laterality Date  . INTESTINAL MALROTATION REPAIR    . NISSEN FUNDOPLICATION     for huge hiatal hernia 07/2008  . thyroid tumor     benign    There were no vitals filed for this visit.      Subjective Assessment - 09/29/16 1017    Subjective Pt denies any pain at the moment   Limitations Walking;Standing   How long can you stand comfortably? standing for about 5 minutes and legs feel like they start to give way   Currently in Pain? No/denies   Multiple Pain Sites No                         [facility] Adult PT Treatment/Exercise - 09/29/16 0001      Lumbar Exercises: Seated   Long Arc Quad on Chair Strengthening;Right;Left;2 sets;15 reps  2lb   Sit to Stand 15 reps  3 sets of 5   Sit to Stand Limitations minimal use of  hands     Knee/Hip Exercises: Aerobic   Nustep L1 x 10 minutes     Knee/Hip Exercises: Standing   Knee Flexion Strengthening;Both;10 reps  2lb   Hip Extension Stengthening;Both;1 set;10 reps;Knee straight  step out with weight shift   Forward Step Up Both;2 sets;10 reps   Other Standing Knee Exercises Shoulder flexion with standing   To increase extensor strength     Knee/Hip Exercises: Seated   Marching Weights 2 lbs.   Abduction/Adduction  Strengthening;Both;2 sets;20 reps  Red band                PT Education - 09/29/16 1048    Education provided Yes   Education Details Standing strengthening   Person(s) Educated Patient;Spouse   Methods Explanation;Demonstration;Handout   Comprehension Verbalized understanding          PT Short Term Goals - 09/29/16 1018      PT SHORT TERM GOAL #1   Title pt will do initial HEP with supervision from her spouse   Time 4   Period Weeks   Status On-going     PT SHORT TERM GOAL #2   Title Berg balance improve to  43/56 for reduced risk of falls   Time 4   Period Weeks   Status On-going     PT SHORT TERM GOAL #3   Title The patient will be able to stand for 15 min for light meal prep without feeling of legs giving way   Time 4   Period Weeks   Status On-going     PT SHORT TERM GOAL #4   Title TUG test improved to 20 sec indicating improved gait speed   Time 4   Period Weeks   Status On-going           PT Long Term Goals - 09/29/16 1019      PT LONG TERM GOAL #1   Title The patient will be independent in HEP for strengthening of LEs and balance training   Time 8   Period Weeks   Status On-going     PT LONG TERM GOAL #2   Title The patient will have gross LE strength 4+/5 needed to descend steps safely at home and church   Time 8   Period Weeks   Status On-going     PT LONG TERM GOAL #3   Title The patient will have improved TUG score to 16 sec indicating improved gait speed and LE strength   Time 8    Period Weeks   Status On-going     PT LONG TERM GOAL #4   Title Sit to stand 5x improved to 25 sec indicating improved LE strength and mobility   Baseline 29.1   Time 8   Period Weeks   Status Achieved     PT LONG TERM GOAL #5   Title BERG balance test improved to 45/56 indicating improved dynamic balance with narrow base of support   Time 8   Period Weeks   Status On-going               Plan - 09/29/16 1055    Clinical Impression Statement Pt able to tolerate all standing and seated exercises well today. Needing moderate verbal and tactile cues to perform exercsies. Pt and husband instructed in home exercises for LE strengthening. Pt will continue to benefit from skilled therapy for strength and balance.    Rehab Potential Good   Clinical Impairments Affecting Rehab Potential memory difficulty   PT Frequency 2x / week   PT Duration 8 weeks   PT Treatment/Interventions Therapeutic activities;Therapeutic exercise;Patient/family education;Neuromuscular re-education;Balance training;Gait training;Stair training;Manual techniques   PT Next Visit Plan balance, LE strengthening   Consulted and Agree with Plan of Care Patient      Patient will benefit from skilled therapeutic intervention in order to improve the following deficits and impairments:  Abnormal gait, Improper body mechanics, Decreased coordination, Decreased activity tolerance, Decreased balance, Decreased endurance, Decreased strength, Difficulty walking  Visit Diagnosis: Difficulty in walking, not elsewhere classified  Muscle weakness (generalized)     Problem List Patient Active Problem List   Diagnosis Date Noted  . Transient alteration of awareness 12/31/2015  . Memory disorder 12/31/2015  . Abnormality of gait 12/31/2015    Dessa PhiKatherine Matthews PTA 09/29/2016, 11:44 AM  [facility] [address], [address] [place], [place], [address] Phone:  [phone]   Fax:  [phone]  Name: Christine Webster MRN: [id number] Date of Birth: [birth year]

## 2016-09-29 NOTE — Patient Instructions (Signed)
High Stepping in Place (Standing)    Stand with support and feet together. Alternately lift knees as high as possible. Keep torso erect. Repeat _10___ times, each leg.  Copyright  VHI. All rights reserved.  Heel Raises    Stand with support. Tighten pelvic floor and hold. With knees straight, raise heels off ground.  Repeat __10_ times. Do _1__ times a day.  Copyright  VHI. All rights reserved.   Dessa PhiKatherine Chivon Lepage, PTA 09/29/16 10:47 AM  [facility] [address], [address] [place], [place] [address] Phone # [phone] Fax [phone]

## 2016-10-01 ENCOUNTER — Ambulatory Visit: Payer: Medicare Other | Admitting: Physical Therapy

## 2016-10-01 ENCOUNTER — Encounter: Payer: Self-pay | Admitting: Physical Therapy

## 2016-10-01 DIAGNOSIS — R2689 Other abnormalities of gait and mobility: Secondary | ICD-10-CM

## 2016-10-01 DIAGNOSIS — R296 Repeated falls: Secondary | ICD-10-CM

## 2016-10-01 DIAGNOSIS — M6281 Muscle weakness (generalized): Secondary | ICD-10-CM

## 2016-10-01 DIAGNOSIS — R262 Difficulty in walking, not elsewhere classified: Secondary | ICD-10-CM | POA: Diagnosis not present

## 2016-10-01 NOTE — Therapy (Signed)
[facility] [address] [place], [place], [address] Phone: [phone]   Fax:  [phone]  Physical Therapy Treatment  Patient Details  Name: Christine Webster MRN: [id number] Date of Birth: [birth year] Referring Provider: Juluis Rainier  Encounter Date: 10/01/2016      PT End of Session - 10/01/16 1405    Visit Number 6   Number of Visits 10   Date for PT Re-Evaluation November 12, 2016   Authorization Type g codes after 10th visit   PT Start Time 1405   PT Stop Time 1444   PT Time Calculation (min) 39 min   Activity Tolerance Patient tolerated treatment well   Behavior During Therapy [facility] for tasks assessed/performed      Past Medical History:  Diagnosis Date  . Abnormality of gait 12/31/2015  . Colon polyps   . Depression   . Diabetes mellitus without complication (HCC)   . Genital herpes   . GERD (gastroesophageal reflux disease)   . Hyperlipidemia   . Hyperparathyroidism (HCC)    Dr. Talmage Nap  . Hypertension   . Kidney stones   . Memory disorder 12/31/2015  . Obesity   . Vitamin D deficiency   . Vitamin D deficiency     Past Surgical History:  Procedure Laterality Date  . INTESTINAL MALROTATION REPAIR    . NISSEN FUNDOPLICATION     for huge hiatal hernia 07/2008  . thyroid tumor     benign    There were no vitals filed for this visit.      Subjective Assessment - 10/01/16 1411    Subjective Pt states she bumped the back of her head when she fell, she believes she fell out of bed.  Discussed treatment with husband who was present in the waiting room before and after treatment.   Limitations Walking;Standing   How long can you walk comfortably? husband reports she walked about 10-15 minutes and then got tired   Currently in Pain? Yes   Pain Score --  unable to say   Pain Location Head   Pain Orientation Right;Lower   Pain Descriptors / Indicators --  unable to say "it hurts right here"   Multiple Pain Sites No                         [facility] Adult PT Treatment/Exercise - 10/01/16 0001      Lumbar Exercises: Standing   Other Standing Lumbar Exercises shoulder external rotatoin red band in sitting - 20x  VC for upright posture     Knee/Hip Exercises: Aerobic   Nustep L1 x 11 minutes     Knee/Hip Exercises: Seated   Long Arc Quad Strengthening;Both;2 sets;15 reps  4lb   Abduction/Adduction  Strengthening;Both;2 sets;20 reps  Red band   Sit to Sand with UE support;5 reps  min UE support                  PT Short Term Goals - 09/29/16 1018      PT SHORT TERM GOAL #1   Title pt will do initial HEP with supervision from her spouse   Time 4   Period Weeks   Status On-going     PT SHORT TERM GOAL #2   Title Berg balance improve to 43/56 for reduced risk of falls   Time 4   Period Weeks   Status On-going     PT SHORT TERM GOAL #3   Title  The patient will be able to stand for 15 min for light meal prep without feeling of legs giving way   Time 4   Period Weeks   Status On-going     PT SHORT TERM GOAL #4   Title TUG test improved to 20 sec indicating improved gait speed   Time 4   Period Weeks   Status On-going           PT Long Term Goals - 09/29/16 1019      PT LONG TERM GOAL #1   Title The patient will be independent in HEP for strengthening of LEs and balance training   Time 8   Period Weeks   Status On-going     PT LONG TERM GOAL #2   Title The patient will have gross LE strength 4+/5 needed to descend steps safely at home and church   Time 8   Period Weeks   Status On-going     PT LONG TERM GOAL #3   Title The patient will have improved TUG score to 16 sec indicating improved gait speed and LE strength   Time 8   Period Weeks   Status On-going     PT LONG TERM GOAL #4   Title Sit to stand 5x improved to 25 sec indicating improved LE strength and mobility   Baseline 29.1   Time 8   Period Weeks   Status  Achieved     PT LONG TERM GOAL #5   Title BERG balance test improved to 45/56 indicating improved dynamic balance with narrow base of support   Time 8   Period Weeks   Status On-going               Plan - 10/01/16 1450    Clinical Impression Statement Pt was very lethargic today and had difficulty understanding therapist cues with standing exercises.  Pt plopped onto mat table after attempting standing exercises with ankle weights.  Pt only able to do sitting exercises today due to wanting to sit down after only a minute of standing when attempting.  PT educated pt and pt's husband after treatment to just do as much standing and walking activities as tolerated several times per day in short intervals and gradually increasing time with standing/walking as tolerated. Pt continues to need skilled PT for increased LE strength and endurance.    Rehab Potential Good   Clinical Impairments Affecting Rehab Potential memory difficulty   PT Frequency 2x / week   PT Duration 8 weeks   PT Treatment/Interventions Therapeutic activities;Therapeutic exercise;Patient/family education;Neuromuscular re-education;Balance training;Gait training;Stair training;Manual techniques   PT Next Visit Plan balance, LE strengthening   PT Home Exercise Plan --   Consulted and Agree with Plan of Care Patient      Patient will benefit from skilled therapeutic intervention in order to improve the following deficits and impairments:  Abnormal gait, Improper body mechanics, Decreased coordination, Decreased activity tolerance, Decreased balance, Decreased endurance, Decreased strength, Difficulty walking  Visit Diagnosis: Difficulty in walking, not elsewhere classified  Muscle weakness (generalized)  Repeated falls  Other abnormalities of gait and mobility     Problem List Patient Active Problem List   Diagnosis Date Noted  . Transient alteration of awareness 12/31/2015  . Memory disorder 12/31/2015  .  Abnormality of gait 12/31/2015    [name], PT 10/01/2016, 3:09 PM  [facility] [address] [place], [place], [address] Phone: [phone]  Fax:  [phone]  Name: [name] MRN: [id number] Date of Birth: January 13, 1941

## 2016-10-13 ENCOUNTER — Encounter: Payer: Medicare Other | Admitting: Physical Therapy

## 2016-10-13 ENCOUNTER — Emergency Department (HOSPITAL_BASED_OUTPATIENT_CLINIC_OR_DEPARTMENT_OTHER)
Admission: EM | Admit: 2016-10-13 | Discharge: 2016-10-13 | Disposition: A | Discharge status: Home / Self Care | Payer: Medicare Other | Attending: Emergency Medicine | Admitting: Emergency Medicine

## 2016-10-13 ENCOUNTER — Emergency Department (HOSPITAL_BASED_OUTPATIENT_CLINIC_OR_DEPARTMENT_OTHER): Payer: Medicare Other

## 2016-10-13 ENCOUNTER — Encounter (HOSPITAL_BASED_OUTPATIENT_CLINIC_OR_DEPARTMENT_OTHER): Payer: Self-pay | Admitting: Emergency Medicine

## 2016-10-13 DIAGNOSIS — Z7982 Long term (current) use of aspirin: Secondary | ICD-10-CM | POA: Diagnosis not present

## 2016-10-13 DIAGNOSIS — Y999 Unspecified external cause status: Secondary | ICD-10-CM | POA: Diagnosis not present

## 2016-10-13 DIAGNOSIS — W1800XA Striking against unspecified object with subsequent fall, initial encounter: Secondary | ICD-10-CM | POA: Diagnosis not present

## 2016-10-13 DIAGNOSIS — Z79899 Other long term (current) drug therapy: Secondary | ICD-10-CM | POA: Insufficient documentation

## 2016-10-13 DIAGNOSIS — Y92002 Bathroom of unspecified non-institutional (private) residence single-family (private) house as the place of occurrence of the external cause: Secondary | ICD-10-CM | POA: Diagnosis not present

## 2016-10-13 DIAGNOSIS — E876 Hypokalemia: Secondary | ICD-10-CM

## 2016-10-13 DIAGNOSIS — Y939 Activity, unspecified: Secondary | ICD-10-CM | POA: Insufficient documentation

## 2016-10-13 DIAGNOSIS — F0390 Unspecified dementia without behavioral disturbance: Secondary | ICD-10-CM | POA: Diagnosis not present

## 2016-10-13 DIAGNOSIS — E119 Type 2 diabetes mellitus without complications: Secondary | ICD-10-CM | POA: Insufficient documentation

## 2016-10-13 DIAGNOSIS — I10 Essential (primary) hypertension: Secondary | ICD-10-CM | POA: Insufficient documentation

## 2016-10-13 DIAGNOSIS — R531 Weakness: Secondary | ICD-10-CM

## 2016-10-13 DIAGNOSIS — S00531A Contusion of lip, initial encounter: Secondary | ICD-10-CM | POA: Diagnosis not present

## 2016-10-13 DIAGNOSIS — S0993XA Unspecified injury of face, initial encounter: Secondary | ICD-10-CM | POA: Diagnosis present

## 2016-10-13 LAB — CBC WITH DIFFERENTIAL/PLATELET
Basophils Absolute: 0 10*3/uL (ref 0.0–0.1)
Basophils Relative: 0 %
EOS ABS: 0 10*3/uL (ref 0.0–0.7)
EOS PCT: 1 %
HCT: 40.7 % (ref 36.0–46.0)
HEMOGLOBIN: 13.7 g/dL (ref 12.0–15.0)
LYMPHS ABS: 1.2 10*3/uL (ref 0.7–4.0)
Lymphocytes Relative: 16 %
MCH: 28.1 pg (ref 26.0–34.0)
MCHC: 33.7 g/dL (ref 30.0–36.0)
MCV: 83.4 fL (ref 78.0–100.0)
MONOS PCT: 8 %
Monocytes Absolute: 0.6 10*3/uL (ref 0.1–1.0)
Neutro Abs: 5.5 10*3/uL (ref 1.7–7.7)
Neutrophils Relative %: 75 %
PLATELETS: 250 10*3/uL (ref 150–400)
RBC: 4.88 MIL/uL (ref 3.87–5.11)
RDW: 13.9 % (ref 11.5–15.5)
WBC: 7.4 10*3/uL (ref 4.0–10.5)

## 2016-10-13 LAB — URINALYSIS, ROUTINE W REFLEX MICROSCOPIC
GLUCOSE, UA: NEGATIVE mg/dL
HGB URINE DIPSTICK: NEGATIVE
Ketones, ur: NEGATIVE mg/dL
Leukocytes, UA: NEGATIVE
Nitrite: NEGATIVE
Protein, ur: NEGATIVE mg/dL
SPECIFIC GRAVITY, URINE: 1.018 (ref 1.005–1.030)
pH: 7 (ref 5.0–8.0)

## 2016-10-13 LAB — COMPREHENSIVE METABOLIC PANEL
ALBUMIN: 4 g/dL (ref 3.5–5.0)
ALK PHOS: 127 U/L — AB (ref 38–126)
ALT: 12 U/L — AB (ref 14–54)
AST: 21 U/L (ref 15–41)
Anion gap: 7 (ref 5–15)
BUN: 8 mg/dL (ref 6–20)
CHLORIDE: 98 mmol/L — AB (ref 101–111)
CO2: 31 mmol/L (ref 22–32)
CREATININE: 0.72 mg/dL (ref 0.44–1.00)
Calcium: 10.8 mg/dL — ABNORMAL HIGH (ref 8.9–10.3)
GFR calc non Af Amer: >60 mL/min (ref >60)
GLUCOSE: 141 mg/dL — AB (ref 65–99)
Potassium: 2.6 mmol/L — CL (ref 3.5–5.1)
SODIUM: 136 mmol/L (ref 135–145)
Total Bilirubin: 1.5 mg/dL — ABNORMAL HIGH (ref 0.3–1.2)
Total Protein: 7.3 g/dL (ref 6.5–8.1)

## 2016-10-13 MED ORDER — POTASSIUM CHLORIDE CRYS ER 20 MEQ PO TBCR: 20.0000 meq | EXTENDED_RELEASE_TABLET | Freq: Every day | ORAL | 0 refills | Status: DC

## 2016-10-13 MED ORDER — POTASSIUM CHLORIDE IN NACL 20-0.9 MEQ/L-% IV SOLN
Freq: Once | INTRAVENOUS | Status: AC
  Administered 2016-10-13: 11:00:00 via INTRAVENOUS
  Filled 2016-10-13: qty 1000

## 2016-10-13 MED ORDER — POTASSIUM CHLORIDE IN NACL 20-0.9 MEQ/L-% IV SOLN: Freq: Once | INTRAVENOUS | Status: DC

## 2016-10-13 MED ORDER — POTASSIUM CHLORIDE CRYS ER 20 MEQ PO TBCR
40.0000 meq | EXTENDED_RELEASE_TABLET | Freq: Once | ORAL | Status: AC
  Administered 2016-10-13: 40 meq via ORAL
  Filled 2016-10-13: qty 2

## 2016-10-13 MED FILL — POTASSIUM CL ER 20 MEQ TABL: 20 | 3 days supply | Qty: 3 | Fill #0

## 2016-10-13 NOTE — ED Triage Notes (Signed)
Pt's husband reports pt fell Sunday in bathroom, hitting face; reports decreased activity/appetite since then.

## 2016-10-13 NOTE — ED Notes (Signed)
ED Provider at bedside. 

## 2016-10-13 NOTE — ED Notes (Signed)
Patient transported to CT 

## 2016-10-13 NOTE — ED Provider Notes (Signed)
[facility] Provider Note   CSN: [id number] Arrival date & time: 10/13/16  0915     History   Chief Complaint Chief Complaint  Patient presents with  . Fall    HPI Christine Webster is a 76 y.o. female.  Patient is a 76 year old female with a history of diabetes, hypertension, hyperlipidemia and memory disorder who lives at home with her husband. Her husband states that over the last 2-3 days she's had increased weakness and difficulty doing things that she used to do before. She also has had some increased confusion however this is been progressive over the last several weeks. She's had a decreased appetite over the last 3 days and is only eating very small amounts over the last 2 days. She's had some vomiting over the last 4-5 days. 2 weeks ago she had an episode of diarrhea that lasted several days. This is resolved. She denies any complaints of pain other than she had a recent fall 2 days ago and bumped her face. She has some swelling to her lips. She denies any other complaints of pain. History is limited due to her confusion.      Past Medical History:  Diagnosis Date  . Abnormality of gait 12/31/2015  . Colon polyps   . Depression   . Diabetes mellitus without complication (HCC)   . Genital herpes   . GERD (gastroesophageal reflux disease)   . Hyperlipidemia   . Hyperparathyroidism (HCC)    Dr. Talmage NapBalan  . Hypertension   . Kidney stones   . Memory disorder 12/31/2015  . Obesity   . Vitamin D deficiency   . Vitamin D deficiency     Patient Active Problem List   Diagnosis Date Noted  . Transient alteration of awareness 12/31/2015  . Memory disorder 12/31/2015  . Abnormality of gait 12/31/2015    Past Surgical History:  Procedure Laterality Date  . INTESTINAL MALROTATION REPAIR    . NISSEN FUNDOPLICATION     for huge hiatal hernia 07/2008  . thyroid tumor     benign    OB History    No data available       Home Medications    Prior to  Admission medications   Medication Sig Start Date End Date Taking? Authorizing Provider  Cholecalciferol (VITAMIN D) 2000 units CAPS Take by mouth daily.   Yes Historical Provider, MD  aspirin 325 MG tablet Take 325 mg by mouth daily.    Historical Provider, MD  atorvastatin (LIPITOR) 10 MG tablet Take 10 mg by mouth daily. Reported on 01/07/2016    Historical Provider, MD  buPROPion (WELLBUTRIN XL) 150 MG 24 hr tablet Take 150 mg by mouth daily. Reported on 01/07/2016 07/22/15   Historical Provider, MD  donepezil (ARICEPT) 10 MG tablet Take 1 tablet (10 mg total) by mouth at bedtime. 09/14/16   York Spanielharles K Willis, MD  esomeprazole (NEXIUM) 40 MG capsule Take 40 mg by mouth 2 (two) times daily before a meal.     Historical Provider, MD  FLUoxetine (PROZAC) 20 MG capsule Take 20 mg by mouth daily.    Historical Provider, MD  ibuprofen (ADVIL,MOTRIN) 200 MG tablet Take 200 mg by mouth every 6 (six) hours as needed.    Historical Provider, MD  potassium chloride SA (K-DUR,KLOR-CON) 20 MEQ tablet Take 1 tablet (20 mEq total) by mouth daily. 10/13/16   Rolan BuccoMelanie Havish Petties, MD  Vitamin D, Ergocalciferol, (DRISDOL) 50000 units CAPS capsule  05/27/16   Historical Provider, MD  Family History Family History  Problem Relation Age of Onset  . Sleep apnea Father   . Alcohol abuse Father   . Stroke Father   . Dementia Mother   . Parkinsonism Sister     Social History Social History  Substance Use Topics  . Smoking status: Never Smoker  . Smokeless tobacco: Never Used  . Alcohol use No     Allergies   Patient has no known allergies.   Review of Systems Review of Systems  Unable to perform ROS: Dementia     Physical Exam Updated Vital Signs BP (!) 164/109   Pulse 63   Temp 98.1 F (36.7 C) (Oral)   Resp 16   Wt 180 lb (81.6 kg)   SpO2 97%   BMI 28.19 kg/m   Physical Exam  Constitutional: She appears well-developed and well-nourished.  HENT:  Head: Normocephalic.  There some ecchymosis  to the left upper and lower lips with an abrasion to the inside of the upper lip. No bony tenderness to the face is noted. The teeth appear intact.  Eyes: Pupils are equal, round, and reactive to light.  Neck: Normal range of motion. Neck supple.  No pain to the cervical thoracic or lumbosacral spine  Cardiovascular: Normal rate, regular rhythm and normal heart sounds.   Pulmonary/Chest: Effort normal and breath sounds normal. No respiratory distress. She has no wheezes. She has no rales. She exhibits no tenderness.  Abdominal: Soft. Bowel sounds are normal. There is no tenderness. There is no rebound and no guarding.  Musculoskeletal: Normal range of motion. She exhibits no edema.  No pain on palpation or range of motion extremities  Lymphadenopathy:    She has no cervical adenopathy.  Neurological: She is alert.  Patient is alert and oriented to person and place. She is confused to the date. She is at baseline mental status per the husband although he states it's been declining over the last several weeks. She moves all extremities symmetrically. No facial drooping is noted. No unilateral weaknesses noted.  Skin: Skin is warm and dry. No rash noted.  Psychiatric: She has a normal mood and affect.     ED Treatments / Results  Labs (all labs ordered are listed, but only abnormal results are displayed) Labs Reviewed  COMPREHENSIVE METABOLIC PANEL - Abnormal; Notable for the following:       Result Value   Potassium 2.6 (*)    Chloride 98 (*)    Glucose, Bld 141 (*)    Calcium 10.8 (*)    ALT 12 (*)    Alkaline Phosphatase 127 (*)    Total Bilirubin 1.5 (*)    All other components within normal limits  URINALYSIS, ROUTINE W REFLEX MICROSCOPIC - Abnormal; Notable for the following:    APPearance CLOUDY (*)    Bilirubin Urine SMALL (*)    All other components within normal limits  URINE CULTURE  CBC WITH DIFFERENTIAL/PLATELET    EKG  EKG Interpretation  Date/Time:  Tuesday  October 13 2016 10:38:34 EST Ventricular Rate:  51 PR Interval:    QRS Duration: 109 QT Interval:  526 QTC Calculation: 485 R Axis:   3 Text Interpretation:  Marked sinus bradycardia Low voltage, precordial leads RSR' in V1 or V2, right VCD or RVH Nonspecific T abnormalities, lateral leads Confirmed by Henretter Piekarski  MD, Emlyn Maves ([id number]) on 10/13/2016 11:57:40 AM       Radiology Ct Head Wo Contrast  Result Date: 10/13/2016 CLINICAL DATA:  Decreased appetite,  recent fall EXAM: CT HEAD WITHOUT CONTRAST TECHNIQUE: Contiguous axial images were obtained from the base of the skull through the vertex without intravenous contrast. COMPARISON:  12/20/2015 FINDINGS: Brain: No evidence of acute infarction, hemorrhage, extra-axial collection, ventriculomegaly, or mass effect. Generalized cerebral atrophy. Periventricular white matter low attenuation likely secondary to microangiopathy. Vascular: Cerebrovascular atherosclerotic calcifications are noted. Skull: Negative for fracture or focal lesion. Sinuses/Orbits: Visualized portions of the orbits are unremarkable. Visualized portions of the paranasal sinuses and mastoid air cells are unremarkable. Other: None. IMPRESSION: 1. No acute intracranial pathology. 2. Chronic microvascular disease and cerebral atrophy. Electronically Signed   By: Elige Ko   On: 10/13/2016 10:11   Dg Abd Acute W/chest  Result Date: 10/13/2016 CLINICAL DATA:  Pt's husband reports pt fell 2 days ago in bathroom, hitting face; reports decreased activity/appetite since then, hx of diabetes, GERD, HTN, hyperlipidemia, intestinal malrotation repair, no other complaints EXAM: DG ABDOMEN ACUTE W/ 1V CHEST COMPARISON:  12/20/2015 and 09/29/2011. FINDINGS: Normal bowel gas pattern.  No free air. No evidence of renal or ureteral stones. Soft tissues are unremarkable. Cardiac silhouette is mildly enlarged. There is a small moderate hiatal hernia. Stable scarring is noted in the left mid lung. Lungs  otherwise clear. Skeletal structures are demineralized but grossly intact. IMPRESSION: 1. No acute findings.  No evidence of bowel obstruction or free air. 2. No acute cardiopulmonary disease. Electronically Signed   By: Amie Portland M.D.   On: 10/13/2016 10:19    Procedures Procedures (including critical care time)  Medications Ordered in ED Medications  potassium chloride SA (K-DUR,KLOR-CON) CR tablet 40 mEq (40 mEq Oral Given 10/13/16 1102)  0.9 % NaCl with KCl 20 mEq/ L  infusion ( Intravenous New Bag/Given 10/13/16 1108)     Initial Impression / Assessment and Plan / ED Course  I have reviewed the triage vital signs and the nursing notes.  Pertinent labs & imaging results that were available during my care of the patient were reviewed by me and considered in my medical decision making (see chart for details).  Clinical Course     Patient presents with generalized increasing weakness fatigue and decreased appetite. Her husband states that she eaten only a piece of toast since yesterday. She is found to be markedly hypokalemic. She was given some potassium replacement in the ED. I don't find any other etiology for her symptoms. Her urine does not appear infected. There is no evidence of pneumonia. No evidence of intracranial hemorrhage. She has no focal neuro deficits. I feel given her symptoms with a marked hypokalemia will be beneficial to admit her for observation and further treatment.  I discussed the pt with Dr. Konrad Dolores who feel that an outpt trial would be appropriate.  I discussed this with the patient's husband who is comfortable with this plan. I will give her prescription for a three-day course of potassium. I advised him that she'll need her potassium closely rechecked by her PCP within the next few days. He is amenable to this. Strict return precautions were given.  Final Clinical Impressions(s) / ED Diagnoses   Final diagnoses:  Hypokalemia  Generalized weakness    New  Prescriptions New Prescriptions   POTASSIUM CHLORIDE SA (K-DUR,KLOR-CON) 20 MEQ TABLET    Take 1 tablet (20 mEq total) by mouth daily.     Rolan Bucco, MD 10/13/16 1314

## 2016-10-14 LAB — URINE CULTURE: Culture: NO GROWTH

## 2016-10-15 ENCOUNTER — Ambulatory Visit: Payer: Medicare Other | Attending: Family Medicine | Admitting: Physical Therapy

## 2016-10-15 DIAGNOSIS — R262 Difficulty in walking, not elsewhere classified: Secondary | ICD-10-CM | POA: Insufficient documentation

## 2016-10-15 DIAGNOSIS — R2689 Other abnormalities of gait and mobility: Secondary | ICD-10-CM | POA: Insufficient documentation

## 2016-10-15 DIAGNOSIS — R296 Repeated falls: Secondary | ICD-10-CM | POA: Insufficient documentation

## 2016-10-15 DIAGNOSIS — M6281 Muscle weakness (generalized): Secondary | ICD-10-CM | POA: Insufficient documentation

## 2016-10-20 ENCOUNTER — Encounter: Payer: Self-pay | Admitting: Physical Therapy

## 2016-10-20 ENCOUNTER — Ambulatory Visit: Payer: Medicare Other | Admitting: Physical Therapy

## 2016-10-20 DIAGNOSIS — R2689 Other abnormalities of gait and mobility: Secondary | ICD-10-CM

## 2016-10-20 DIAGNOSIS — M6281 Muscle weakness (generalized): Secondary | ICD-10-CM | POA: Diagnosis present

## 2016-10-20 DIAGNOSIS — R262 Difficulty in walking, not elsewhere classified: Secondary | ICD-10-CM

## 2016-10-20 DIAGNOSIS — R296 Repeated falls: Secondary | ICD-10-CM | POA: Diagnosis present

## 2016-10-20 NOTE — Therapy (Signed)
[facility] [address] [place], [place], [address] Phone: [phone]   Fax:  [phone]  Physical Therapy Treatment  Patient Details  Name: Christine Webster MRN: [id number] Date of Birth: 1941/06/05 Referring Provider: Juluis Rainier  Encounter Date: 10/20/2016      PT End of Session - 10/20/16 1019    Visit Number 7   Number of Visits 10   Date for PT Re-Evaluation 11/23/2016   Authorization Type g codes after 10th visit   PT Start Time 1013   PT Stop Time 1051   PT Time Calculation (min) 38 min   Activity Tolerance Patient limited by lethargy   Behavior During Therapy [facility] for tasks assessed/performed      Past Medical History:  Diagnosis Date  . Abnormality of gait 12/31/2015  . Colon polyps   . Depression   . Diabetes mellitus without complication (HCC)   . Genital herpes   . GERD (gastroesophageal reflux disease)   . Hyperlipidemia   . Hyperparathyroidism (HCC)    Dr. Talmage Nap  . Hypertension   . Kidney stones   . Memory disorder 12/31/2015  . Obesity   . Vitamin D deficiency   . Vitamin D deficiency     Past Surgical History:  Procedure Laterality Date  . INTESTINAL MALROTATION REPAIR    . NISSEN FUNDOPLICATION     for huge hiatal hernia 07/2008  . thyroid tumor     benign    There were no vitals filed for this visit.      Subjective Assessment - 10/20/16 1018    Subjective Pt stated shes doing fine today. Denies any pain.    Limitations Walking;Standing   How long can you stand comfortably? standing for about 5 minutes and legs feel like they start to give way   How long can you walk comfortably? husband reports she walked about 10-15 minutes and then got tired   Currently in Pain? No/denies                         [facility] Adult PT Treatment/Exercise - 10/20/16 0001      Lumbar Exercises: Seated   Long Arc Quad on Chair Strengthening;Right;Left;2 sets;15 reps   Max verbal cues   Sit to Stand 10 reps  Pt needing max verbal and tactile cues     Knee/Hip Exercises: Aerobic   Nustep L1 x 10 minutes  Therapist present to monitor patient      Knee/Hip Exercises: Standing   Hip Flexion Stengthening;Both;1 set;10 reps;Knee bent  Max verbal cues     Knee/Hip Exercises: Seated   Abduction/Adduction  Strengthening;Both;2 sets;10 reps  Red band, Max verbal and tactile cues                  PT Short Term Goals - 10/20/16 1020      PT SHORT TERM GOAL #1   Title pt will do initial HEP with supervision from her spouse   Time 4   Period Weeks   Status On-going     PT SHORT TERM GOAL #2   Title Berg balance improve to 43/56 for reduced risk of falls   Time 4   Period Weeks   Status On-going     PT SHORT TERM GOAL #3   Title The patient will be able to stand for 15 min for light meal prep without feeling of legs giving way   Time 4  Period Weeks   Status On-going     PT SHORT TERM GOAL #4   Title TUG test improved to 20 sec indicating improved gait speed   Time 4   Period Weeks   Status On-going           PT Long Term Goals - 10/20/16 1021      PT LONG TERM GOAL #1   Title The patient will be independent in HEP for strengthening of LEs and balance training   Time 8   Period Weeks   Status On-going     PT LONG TERM GOAL #2   Title The patient will have gross LE strength 4+/5 needed to descend steps safely at home and church   Time 8   Period Weeks   Status On-going     PT LONG TERM GOAL #3   Title The patient will have improved TUG score to 16 sec indicating improved gait speed and LE strength   Time 8   Period Weeks   Status On-going               Plan - 10/20/16 1019    Clinical Impression Statement Pt presents confused needing Max verbal and tactile cues for exercises and moveing around gym. Pt needed constant verbal and tactile cueing to perform exercises. Unable to progress with standing balance  exericses due to pt confusion. Several attempts to perform sit to stand.  Spoke with pts husband who stated she has been unable ot do exercsies at home. Pt continues to need skilled therapy for strength, coordination, and balance.    Rehab Potential Good   Clinical Impairments Affecting Rehab Potential memory difficulty   PT Frequency 2x / week   PT Duration 8 weeks   PT Treatment/Interventions Therapeutic activities;Therapeutic exercise;Patient/family education;Neuromuscular re-education;Balance training;Gait training;Stair training;Manual techniques   PT Next Visit Plan Balanace as toelrated, LE strengthening   Consulted and Agree with Plan of Care Patient      Patient will benefit from skilled therapeutic intervention in order to improve the following deficits and impairments:  Abnormal gait, Improper body mechanics, Decreased coordination, Decreased activity tolerance, Decreased balance, Decreased endurance, Decreased strength, Difficulty walking  Visit Diagnosis: Difficulty in walking, not elsewhere classified  Muscle weakness (generalized)  Repeated falls  Other abnormalities of gait and mobility     Problem List Patient Active Problem List   Diagnosis Date Noted  . Transient alteration of awareness 12/31/2015  . Memory disorder 12/31/2015  . Abnormality of gait 12/31/2015    Dessa PhiKatherine Matthews PTA 10/20/2016, 11:03 AM  [facility] [address], [address] [place], [place], [address] Phone: [phone]   Fax:  [phone]  Name: Christine Webster MRN: [id number] Date of Birth: [birth year]

## 2016-10-22 ENCOUNTER — Encounter: Payer: Self-pay | Admitting: Physical Therapy

## 2016-10-22 ENCOUNTER — Ambulatory Visit: Payer: Medicare Other | Admitting: Physical Therapy

## 2016-10-22 DIAGNOSIS — R2689 Other abnormalities of gait and mobility: Secondary | ICD-10-CM

## 2016-10-22 DIAGNOSIS — M6281 Muscle weakness (generalized): Secondary | ICD-10-CM

## 2016-10-22 DIAGNOSIS — R296 Repeated falls: Secondary | ICD-10-CM

## 2016-10-22 DIAGNOSIS — R262 Difficulty in walking, not elsewhere classified: Secondary | ICD-10-CM

## 2016-10-22 NOTE — Therapy (Addendum)
[facility] [address] [place], [place], [address] Phone: [phone]   Fax:  [phone]  Physical Therapy Treatment  Patient Details  Name: Christine Webster MRN: [id number] Date of Birth: Oct 24, 1940 Referring Provider: Juluis Rainier  Encounter Date: 10/22/2016      PT End of Session - 10/22/16 1235    Visit Number 8   Number of Visits 10   Date for PT Re-Evaluation 2016/11/23   Authorization Type g codes after 10th visit   PT Start Time 1227   PT Stop Time 1312   PT Time Calculation (min) 45 min   Activity Tolerance Other (comment)   Behavior During Therapy [facility] for tasks assessed/performed      Past Medical History:  Diagnosis Date  . Abnormality of gait 12/31/2015  . Colon polyps   . Depression   . Diabetes mellitus without complication (HCC)   . Genital herpes   . GERD (gastroesophageal reflux disease)   . Hyperlipidemia   . Hyperparathyroidism (HCC)    Dr. Talmage Nap  . Hypertension   . Kidney stones   . Memory disorder 12/31/2015  . Obesity   . Vitamin D deficiency   . Vitamin D deficiency     Past Surgical History:  Procedure Laterality Date  . INTESTINAL MALROTATION REPAIR    . NISSEN FUNDOPLICATION     for huge hiatal hernia 07/2008  . thyroid tumor     benign    There were no vitals filed for this visit.      Subjective Assessment - 10/22/16 1235    Subjective Pt denies pain, reports she is doing fine.    Limitations Walking;Standing   How long can you stand comfortably? standing for about 5 minutes and legs feel like they start to give way   How long can you walk comfortably? husband reports she walked about 10-15 minutes and then got tired   Currently in Pain? No/denies   Pain Score 0-No pain                         [facility] Adult PT Treatment/Exercise - 10/22/16 0001      Lumbar Exercises: Seated   Sit to Stand 5 reps  Pt needing mod verbal and tactile  cues     Knee/Hip Exercises: Aerobic   Nustep L1 x 10 minutes  Therapist present to monitor patient      Knee/Hip Exercises: Standing   Forward Step Up 1 set;Step Height: 6"  Ascend and descend stairs x1 CGA   Gait Training Walking in therapy gym  Mod verbal cues and min assist with stearing                  PT Short Term Goals - 10/20/16 1020      PT SHORT TERM GOAL #1   Title pt will do initial HEP with supervision from her spouse   Time 4   Period Weeks   Status On-going     PT SHORT TERM GOAL #2   Title Berg balance improve to 43/56 for reduced risk of falls   Time 4   Period Weeks   Status On-going     PT SHORT TERM GOAL #3   Title The patient will be able to stand for 15 min for light meal prep without feeling of legs giving way   Time 4   Period Weeks   Status On-going     PT  SHORT TERM GOAL #4   Title TUG test improved to 20 sec indicating improved gait speed   Time 4   Period Weeks   Status On-going           PT Long Term Goals - 10/20/16 1021      PT LONG TERM GOAL #1   Title The patient will be independent in HEP for strengthening of LEs and balance training   Time 8   Period Weeks   Status On-going     PT LONG TERM GOAL #2   Title The patient will have gross LE strength 4+/5 needed to descend steps safely at home and church   Time 8   Period Weeks   Status On-going     PT LONG TERM GOAL #3   Title The patient will have improved TUG score to 16 sec indicating improved gait speed and LE strength   Time 8   Period Weeks   Status On-going               Plan - 10/22/16 1238    Clinical Impression Statement Pt needing consitant verbal cues to keep performing exercises today. Family reports pt not doing exercises at home. Able to ambulate in clinic but need moderate directional cues. Pt became tired and demonstrated difficulty with movements not due to muscle weakness.  Pt very tired owards end of treatment needing frequent  rest breaks and extended time to walk to lobby. Pt will continue to benefit from skilled therapy for progression of balance and endurance.    Rehab Potential Good   Clinical Impairments Affecting Rehab Potential memory difficulty   PT Frequency 2x / week   PT Duration 8 weeks   PT Treatment/Interventions Therapeutic activities;Therapeutic exercise;Patient/family education;Neuromuscular re-education;Balance training;Gait training;Stair training;Manual techniques   PT Next Visit Plan Discuss progress and findings with pt family; pt possibly needing neurologist?    Consulted and Agree with Plan of Care Patient      Patient will benefit from skilled therapeutic intervention in order to improve the following deficits and impairments:  Abnormal gait, Improper body mechanics, Decreased coordination, Decreased activity tolerance, Decreased balance, Decreased endurance, Decreased strength, Difficulty walking  Visit Diagnosis: Difficulty in walking, not elsewhere classified  Muscle weakness (generalized)  Repeated falls  Other abnormalities of gait and mobility     Problem List Patient Active Problem List   Diagnosis Date Noted  . Transient alteration of awareness 12/31/2015  . Memory disorder 12/31/2015  . Abnormality of gait 12/31/2015    Dessa PhiKatherine Porshe Fleagle PTA 10/22/2016, 3:43 PM  [facility] [address] [place], [place], [address] Phone: [phone]   Fax:  [phone]  Name: Christine Webster MRN: [id number] Date of Birth: May 30, 1941

## 2016-10-26 ENCOUNTER — Telehealth: Payer: Self-pay | Admitting: Neurology

## 2016-10-26 NOTE — Telephone Encounter (Signed)
Patient's husband calling stating patient has gotten worse in the last month. She is having more trouble with memory and cannot do anything for herself. She has an appointment scheduled 12-07-16 and wants to know if she should come in sooner.

## 2016-10-27 ENCOUNTER — Encounter: Payer: Medicare Other | Admitting: Physical Therapy

## 2016-10-27 NOTE — Telephone Encounter (Signed)
Sooner appt scheduled for 11/04/14. Husband is waiting to hear back from PCP office re: home health.

## 2016-10-29 ENCOUNTER — Ambulatory Visit: Payer: Medicare Other | Admitting: Physical Therapy

## 2016-10-29 ENCOUNTER — Encounter: Payer: Medicare Other | Admitting: Physical Therapy

## 2016-11-03 ENCOUNTER — Telehealth: Payer: Self-pay | Admitting: Neurology

## 2016-11-03 ENCOUNTER — Encounter: Payer: Medicare Other | Admitting: Physical Therapy

## 2016-11-03 NOTE — Telephone Encounter (Signed)
Spoke to pt's husband who says that a friend has agreed to help get pt to her appt tomorrow. They have not yet heard from home health (ordered by PCP). Will discuss needs further at tomorrow's appt and can send in same day home health orders per Dr. Anne HahnWillis to [facility] if needed.

## 2016-11-03 NOTE — Telephone Encounter (Signed)
Patients husband called in reference to appt tomorrow.  He doesn't know if he is able to get her up and ready for the appointment and would like to speak with a nurse about a possible nurse to help.  Please call

## 2016-11-04 ENCOUNTER — Ambulatory Visit (INDEPENDENT_AMBULATORY_CARE_PROVIDER_SITE_OTHER): Payer: Medicare Other | Admitting: Neurology

## 2016-11-04 ENCOUNTER — Encounter: Payer: Self-pay | Admitting: Neurology

## 2016-11-04 ENCOUNTER — Telehealth: Payer: Self-pay

## 2016-11-04 VITALS — BP 152/91 | HR 83 | Ht 67.0 in | Wt 170.0 lb

## 2016-11-04 DIAGNOSIS — R269 Unspecified abnormalities of gait and mobility: Secondary | ICD-10-CM

## 2016-11-04 DIAGNOSIS — Z5181 Encounter for therapeutic drug level monitoring: Secondary | ICD-10-CM

## 2016-11-04 DIAGNOSIS — R413 Other amnesia: Secondary | ICD-10-CM | POA: Diagnosis not present

## 2016-11-04 NOTE — Telephone Encounter (Signed)
I will place an order for home health care, the patient has had a significant alteration in her functional level recently.

## 2016-11-04 NOTE — Telephone Encounter (Signed)
This pat states [facility] called 5 home care places and was rejected.  He was told by their office to call Dr Anne Hahnwillis and see if we can find a in home health for patient.  Insurance is [employer] [facility]  Pt phone number is [phone]

## 2016-11-04 NOTE — Patient Instructions (Signed)
   We will get blood work today and get MRI of the brain. 

## 2016-11-04 NOTE — Progress Notes (Signed)
Reason for visit: Memory disorder  Christine Webster is an 75 y.o. female  History of present illness:  Christine Webster is a 76 year old right-handed white female with a history of a memory disturbance. The patient has had some mild alteration in balance, she has been using a walker for ambulation. The patient was last seen in August 2017, at that time she was scoring 27/30 on the Mini-Mental Status Examination, she was able to walk relatively normally. The husband comes with her today, he indicates that she had a relatively sudden alteration in her functional status a few days prior to Christmas of 2017. The patient had what seemed to be a viral illness with nausea and vomiting, and diarrhea. She has had a significant change in functional level since that time and she has not recovered. The patient has had worsening memory and mentation, and decreased verbal output. The patient has not been able to ambulate effectively, she is now using a wheelchair to get around. She is not eating well. She is beginning to lose weight. She was in the emergency room around the second of January 2018 with significant hypokalemia. She has undergone physical therapy for her walking without much benefit. The patient has urinary incontinence at this time. She fell recently within the last 6 days when she tried to get up out of bed and fell and hit her left brow on the nightstand without loss of consciousness. Given the rapid change in her functional level, she comes into the office today for an urgent evaluation. A CT scan of the brain done on October 13, 2016 in the emergency room did not show any acute changes.  Past Medical History:  Diagnosis Date  . Abnormality of gait 12/31/2015  . Colon polyps   . Depression   . Diabetes mellitus without complication (HCC)   . Genital herpes   . GERD (gastroesophageal reflux disease)   . Hyperlipidemia   . Hyperparathyroidism (HCC)    Dr. Talmage Nap  . Hypertension   .  Kidney stones   . Memory disorder 12/31/2015  . Obesity   . Vitamin D deficiency     Past Surgical History:  Procedure Laterality Date  . INTESTINAL MALROTATION REPAIR    . NISSEN FUNDOPLICATION     for huge hiatal hernia 07/2008  . thyroid tumor     benign    Family History  Problem Relation Age of Onset  . Sleep apnea Father   . Alcohol abuse Father   . Stroke Father   . Dementia Mother   . Parkinsonism Sister     Social history:  reports that she has never smoked. She has never used smokeless tobacco. She reports that she does not drink alcohol or use drugs.   No Known Allergies  Medications:  Prior to Admission medications   Medication Sig Start Date End Date Taking? Authorizing Provider  aspirin 325 MG tablet Take 325 mg by mouth daily.   Yes Historical Provider, MD  atorvastatin (LIPITOR) 10 MG tablet Take 10 mg by mouth daily. Reported on 01/07/2016   Yes Historical Provider, MD  buPROPion (WELLBUTRIN XL) 150 MG 24 hr tablet Take 150 mg by mouth daily. Reported on 01/07/2016 07/22/15  Yes Historical Provider, MD  Cholecalciferol (VITAMIN D) 2000 units CAPS Take by mouth daily.   Yes Historical Provider, MD  donepezil (ARICEPT) 10 MG tablet Take 1 tablet (10 mg total) by mouth at bedtime. 09/14/16  Yes York Spaniel, MD  esomeprazole (NEXIUM)  40 MG capsule Take 40 mg by mouth 2 (two) times daily before a meal.    Yes Historical Provider, MD  FLUoxetine (PROZAC) 20 MG capsule Take 20 mg by mouth daily.   Yes Historical Provider, MD  Vitamin D, Ergocalciferol, (DRISDOL) 50000 units CAPS capsule  05/27/16  Yes Historical Provider, MD    ROS:  Out of a complete 14 system review of symptoms, the patient complains only of the following symptoms, and all other reviewed systems are negative.  Nausea and vomiting Daytime sleepiness Walking difficulty Memory loss Confusion  Blood pressure (!) 152/91, pulse 83, height 5\' 7"  (1.702 m), weight 170 lb (77.1 kg).  Physical  Exam  General: The patient is alert and cooperative at the time of the examination.  Skin: No significant peripheral edema is noted.   Neurologic Exam  Mental status: The patient is alert and oriented x 1 at the time of the examination (oriented only to person). The Mini-Mental Status Examination done today shows a total score of 12/30.   Cranial nerves: Facial symmetry is present. Speech is normal, no aphasia or dysarthria is noted. Extraocular movements are full, with exception of a superior gaze paresis. Visual fields are to threat.  Motor: The patient has good strength in all 4 extremities.  Sensory examination: Soft touch sensation is symmetric on the face, arms, and legs.  Coordination: The patient has significant apraxia with the use of all extremities, she is not able to effectively perform tasks for cerebellar testing.  Gait and station: The patient is able to walk short distances with assistance, she takes short shuffling steps, slow turns. Romberg is negative, panda gait was not attempted.  Reflexes: Deep tendon reflexes are symmetric.   CT head 10/13/16:  IMPRESSION: 1. No acute intracranial pathology. 2. Chronic microvascular disease and cerebral atrophy.  * CT scan images were reviewed online. I agree with the written report.    Assessment/Plan:  1. Memory disorder  2. Gait disorder  The patient has had a significant change in her functional level that has occurred very rapidly over the last 4 weeks. The patient has gone from walking relatively well to being unable to walk without assistance, she has had decreased verbal output, a significant alteration in mental status, and decreased intake of food and fluids with weight loss. The patient will be set up for MRI of the brain, further blood work will be done today. If the studies are unremarkable, the possibility of a prion disease should be considered. The patient will follow-up in 3 months.    Marlan Palau. Keith Addaleigh Nicholls  MD 11/04/2016 1:27 PM  [facility] [address] [place], [place] [id number]  Phone [phone] Fax [phone]

## 2016-11-05 ENCOUNTER — Telehealth: Payer: Self-pay

## 2016-11-05 NOTE — Telephone Encounter (Signed)
Home health referral sent to [facility]. 

## 2016-11-05 NOTE — Telephone Encounter (Signed)
-----   Message from York Spanielharles K Willis, MD sent at 11/04/2016  6:20 PM EST ----- I did put in an order for home health physical therapy, also need aid and assistance

## 2016-11-06 ENCOUNTER — Emergency Department (HOSPITAL_COMMUNITY): Payer: Medicare Other

## 2016-11-06 ENCOUNTER — Observation Stay (HOSPITAL_COMMUNITY)
Admission: EM | Admit: 2016-11-06 | Discharge: 2016-11-07 | Disposition: A | Discharge status: Home / Self Care | Payer: Medicare Other | Attending: Internal Medicine | Admitting: Internal Medicine

## 2016-11-06 ENCOUNTER — Encounter (HOSPITAL_COMMUNITY): Payer: Self-pay

## 2016-11-06 DIAGNOSIS — R627 Adult failure to thrive: Secondary | ICD-10-CM | POA: Diagnosis not present

## 2016-11-06 DIAGNOSIS — E119 Type 2 diabetes mellitus without complications: Secondary | ICD-10-CM | POA: Diagnosis not present

## 2016-11-06 DIAGNOSIS — F329 Major depressive disorder, single episode, unspecified: Secondary | ICD-10-CM | POA: Diagnosis not present

## 2016-11-06 DIAGNOSIS — G9340 Encephalopathy, unspecified: Secondary | ICD-10-CM | POA: Diagnosis not present

## 2016-11-06 DIAGNOSIS — Z79899 Other long term (current) drug therapy: Secondary | ICD-10-CM | POA: Insufficient documentation

## 2016-11-06 DIAGNOSIS — K5900 Constipation, unspecified: Principal | ICD-10-CM | POA: Insufficient documentation

## 2016-11-06 DIAGNOSIS — R531 Weakness: Secondary | ICD-10-CM | POA: Diagnosis present

## 2016-11-06 DIAGNOSIS — E860 Dehydration: Secondary | ICD-10-CM | POA: Diagnosis not present

## 2016-11-06 DIAGNOSIS — E785 Hyperlipidemia, unspecified: Secondary | ICD-10-CM | POA: Diagnosis not present

## 2016-11-06 DIAGNOSIS — I10 Essential (primary) hypertension: Secondary | ICD-10-CM | POA: Insufficient documentation

## 2016-11-06 DIAGNOSIS — E669 Obesity, unspecified: Secondary | ICD-10-CM | POA: Insufficient documentation

## 2016-11-06 DIAGNOSIS — E559 Vitamin D deficiency, unspecified: Secondary | ICD-10-CM | POA: Diagnosis not present

## 2016-11-06 DIAGNOSIS — E876 Hypokalemia: Secondary | ICD-10-CM | POA: Insufficient documentation

## 2016-11-06 DIAGNOSIS — R112 Nausea with vomiting, unspecified: Secondary | ICD-10-CM | POA: Insufficient documentation

## 2016-11-06 DIAGNOSIS — E213 Hyperparathyroidism, unspecified: Secondary | ICD-10-CM | POA: Diagnosis not present

## 2016-11-06 DIAGNOSIS — F0390 Unspecified dementia without behavioral disturbance: Secondary | ICD-10-CM | POA: Diagnosis not present

## 2016-11-06 DIAGNOSIS — R1110 Vomiting, unspecified: Secondary | ICD-10-CM | POA: Diagnosis present

## 2016-11-06 DIAGNOSIS — K219 Gastro-esophageal reflux disease without esophagitis: Secondary | ICD-10-CM | POA: Insufficient documentation

## 2016-11-06 DIAGNOSIS — Z7982 Long term (current) use of aspirin: Secondary | ICD-10-CM | POA: Insufficient documentation

## 2016-11-06 DIAGNOSIS — W19XXXA Unspecified fall, initial encounter: Secondary | ICD-10-CM | POA: Insufficient documentation

## 2016-11-06 DIAGNOSIS — Z23 Encounter for immunization: Secondary | ICD-10-CM | POA: Insufficient documentation

## 2016-11-06 DIAGNOSIS — R413 Other amnesia: Secondary | ICD-10-CM | POA: Diagnosis present

## 2016-11-06 HISTORY — DX: Unspecified dementia, unspecified severity, without behavioral disturbance, psychotic disturbance, mood disturbance, and anxiety: F03.90

## 2016-11-06 LAB — URINALYSIS, ROUTINE W REFLEX MICROSCOPIC
BILIRUBIN URINE: NEGATIVE
Glucose, UA: NEGATIVE mg/dL
HGB URINE DIPSTICK: NEGATIVE
Ketones, ur: NEGATIVE mg/dL
LEUKOCYTES UA: NEGATIVE
NITRITE: NEGATIVE
PH: 7 (ref 5.0–8.0)
Protein, ur: 30 mg/dL — AB
SPECIFIC GRAVITY, URINE: 1.015 (ref 1.005–1.030)

## 2016-11-06 LAB — COMPREHENSIVE METABOLIC PANEL
ALT: 17 U/L (ref 14–54)
ANION GAP: 13 (ref 5–15)
AST: 22 U/L (ref 15–41)
Albumin: 3.7 g/dL (ref 3.5–5.0)
Alkaline Phosphatase: 82 U/L (ref 38–126)
BILIRUBIN TOTAL: 1.4 mg/dL — AB (ref 0.3–1.2)
BUN: 7 mg/dL (ref 6–20)
CALCIUM: 12.4 mg/dL — AB (ref 8.9–10.3)
CO2: 27 mmol/L (ref 22–32)
Chloride: 98 mmol/L — ABNORMAL LOW (ref 101–111)
Creatinine, Ser: 0.84 mg/dL (ref 0.44–1.00)
GFR calc Af Amer: >60 mL/min (ref >60)
Glucose, Bld: 141 mg/dL — ABNORMAL HIGH (ref 65–99)
Potassium: 2.9 mmol/L — ABNORMAL LOW (ref 3.5–5.1)
Sodium: 138 mmol/L (ref 135–145)
TOTAL PROTEIN: 6.5 g/dL (ref 6.5–8.1)

## 2016-11-06 LAB — CBC WITH DIFFERENTIAL/PLATELET
BASOS PCT: 1 %
Basophils Absolute: 0 10*3/uL (ref 0.0–0.1)
Eosinophils Absolute: 0 10*3/uL (ref 0.0–0.7)
Eosinophils Relative: 0 %
HCT: 44.3 % (ref 36.0–46.0)
Hemoglobin: 15.2 g/dL — ABNORMAL HIGH (ref 12.0–15.0)
LYMPHS ABS: 1.3 10*3/uL (ref 0.7–4.0)
LYMPHS PCT: 17 %
MCH: 28.2 pg (ref 26.0–34.0)
MCHC: 34.3 g/dL (ref 30.0–36.0)
MCV: 82.2 fL (ref 78.0–100.0)
MONO ABS: 0.5 10*3/uL (ref 0.1–1.0)
MONOS PCT: 6 %
NEUTROS ABS: 5.9 10*3/uL (ref 1.7–7.7)
Neutrophils Relative %: 76 %
Platelets: 237 10*3/uL (ref 150–400)
RBC: 5.39 MIL/uL — ABNORMAL HIGH (ref 3.87–5.11)
RDW: 14 % (ref 11.5–15.5)
WBC: 8.1 10*3/uL (ref 4.0–10.5)

## 2016-11-06 LAB — I-STAT TROPONIN, ED: Troponin i, poc: 0.01 ng/mL (ref 0.00–0.08)

## 2016-11-06 LAB — I-STAT CG4 LACTIC ACID, ED: Lactic Acid, Venous: 1.06 mmol/L (ref 0.5–1.9)

## 2016-11-06 LAB — INFLUENZA PANEL BY PCR (TYPE A & B)
INFLAPCR: NEGATIVE
INFLBPCR: NEGATIVE

## 2016-11-06 LAB — LIPASE, BLOOD: LIPASE: 19 U/L (ref 11–51)

## 2016-11-06 MED ORDER — KCL IN DEXTROSE-NACL 40-5-0.45 MEQ/L-%-% IV SOLN
INTRAVENOUS | Status: DC
  Filled 2016-11-06: qty 1000

## 2016-11-06 MED ORDER — SODIUM CHLORIDE 0.9 % IV SOLN
INTRAVENOUS | Status: DC
  Administered 2016-11-06 – 2016-11-07 (×2): via INTRAVENOUS
  Filled 2016-11-06 (×5): qty 1000

## 2016-11-06 MED ORDER — ACETAMINOPHEN 650 MG RE SUPP: 650.0000 mg | Freq: Four times a day (QID) | RECTAL | Status: DC | PRN

## 2016-11-06 MED ORDER — SODIUM CHLORIDE 0.9 % IV BOLUS (SEPSIS)
1000.0000 mL | Freq: Once | INTRAVENOUS | Status: AC
  Administered 2016-11-06: 1000 mL via INTRAVENOUS

## 2016-11-06 MED ORDER — ONDANSETRON HCL 4 MG PO TABS: 4.0000 mg | ORAL_TABLET | Freq: Four times a day (QID) | ORAL | Status: DC | PRN

## 2016-11-06 MED ORDER — ASPIRIN EC 325 MG PO TBEC
325.0000 mg | DELAYED_RELEASE_TABLET | Freq: Every evening | ORAL | Status: DC
  Administered 2016-11-06: 325 mg via ORAL
  Filled 2016-11-06: qty 1

## 2016-11-06 MED ORDER — POTASSIUM CHLORIDE CRYS ER 20 MEQ PO TBCR
40.0000 meq | EXTENDED_RELEASE_TABLET | Freq: Two times a day (BID) | ORAL | Status: DC
  Filled 2016-11-06: qty 2

## 2016-11-06 MED ORDER — FLUOXETINE HCL 20 MG PO CAPS
40.0000 mg | ORAL_CAPSULE | Freq: Every day | ORAL | Status: DC
  Administered 2016-11-07: 40 mg via ORAL
  Filled 2016-11-06: qty 2

## 2016-11-06 MED ORDER — BUPROPION HCL ER (XL) 150 MG PO TB24
150.0000 mg | ORAL_TABLET | Freq: Every day | ORAL | Status: DC
  Administered 2016-11-07: 150 mg via ORAL
  Filled 2016-11-06: qty 1

## 2016-11-06 MED ORDER — PANTOPRAZOLE SODIUM 40 MG PO TBEC
40.0000 mg | DELAYED_RELEASE_TABLET | Freq: Every day | ORAL | Status: DC
  Administered 2016-11-07: 40 mg via ORAL
  Filled 2016-11-06: qty 1

## 2016-11-06 MED ORDER — DONEPEZIL HCL 10 MG PO TABS
10.0000 mg | ORAL_TABLET | Freq: Every day | ORAL | Status: DC
  Administered 2016-11-06: 10 mg via ORAL
  Filled 2016-11-06: qty 1

## 2016-11-06 MED ORDER — PNEUMOCOCCAL VAC POLYVALENT 25 MCG/0.5ML IJ INJ
0.5000 mL | INJECTION | INTRAMUSCULAR | Status: AC
  Administered 2016-11-07: 0.5 mL via INTRAMUSCULAR
  Filled 2016-11-06: qty 0.5

## 2016-11-06 MED ORDER — ACETAMINOPHEN 325 MG PO TABS: 650.0000 mg | ORAL_TABLET | Freq: Four times a day (QID) | ORAL | Status: DC | PRN

## 2016-11-06 MED ORDER — ATORVASTATIN CALCIUM 10 MG PO TABS
10.0000 mg | ORAL_TABLET | Freq: Every evening | ORAL | Status: DC
  Administered 2016-11-06: 10 mg via ORAL
  Filled 2016-11-06: qty 1

## 2016-11-06 MED ORDER — ONDANSETRON HCL 4 MG/2ML IJ SOLN: 4.0000 mg | Freq: Four times a day (QID) | INTRAMUSCULAR | Status: DC | PRN

## 2016-11-06 MED ORDER — ONDANSETRON HCL 4 MG/2ML IJ SOLN
4.0000 mg | Freq: Once | INTRAMUSCULAR | Status: AC
  Administered 2016-11-06: 4 mg via INTRAVENOUS
  Filled 2016-11-06: qty 2

## 2016-11-06 NOTE — ED Provider Notes (Signed)
[facility]-EMERGENCY DEPT Provider Note   CSN: [id number] Arrival date & time: 11/06/16  1106     History   Chief Complaint Chief Complaint  Patient presents with  . Weakness    HPI Christine Webster is a 76 y.o. female.  HPI  Presents with concern for nausea and vomiting beginning today, increased generalized weakness. Husband reports that over the last 3 weeks she's had decreased appetite.  Reports she had a fall last week, and has not had CT imaging. Has not complained of any pain anywhere. Has not had a bowel movement in approximately 5 or 6 days.  Denies abdominal pain, urinary symptoms, diarrhea. Denies chest pain, shortness of breath, headaches. No fevers. She has had cough beginning today.   Past Medical History:  Diagnosis Date  . Abnormality of gait 12/31/2015  . Colon polyps   . Dementia   . Depression   . Diabetes mellitus without complication (HCC)   . Genital herpes   . GERD (gastroesophageal reflux disease)   . Hyperlipidemia   . Hyperparathyroidism (HCC)    Dr. Talmage NapBalan  . Hypertension   . Kidney stones   . Memory disorder 12/31/2015  . Obesity   . Vitamin D deficiency     Patient Active Problem List   Diagnosis Date Noted  . Dehydration 11/06/2016  . Failure to thrive in adult 11/06/2016  . Hypokalemia 11/06/2016  . Vomiting 11/06/2016  . Hypertension   . Diabetes mellitus without complication (HCC)   . GERD (gastroesophageal reflux disease)   . Obesity   . Hyperparathyroidism (HCC)   . Transient alteration of awareness 12/31/2015  . Memory disorder 12/31/2015  . Abnormality of gait 12/31/2015    Past Surgical History:  Procedure Laterality Date  . INTESTINAL MALROTATION REPAIR    . NISSEN FUNDOPLICATION     for huge hiatal hernia 07/2008  . thyroid tumor     benign    OB History    No data available       Home Medications    Prior to Admission medications   Medication Sig Start Date End Date Taking? Authorizing Provider  aspirin  EC 325 MG tablet Take 325 mg by mouth every evening.   Yes Historical Provider, MD  atorvastatin (LIPITOR) 10 MG tablet Take 10 mg by mouth every evening.    Yes Historical Provider, MD  buPROPion (WELLBUTRIN XL) 150 MG 24 hr tablet Take 150 mg by mouth daily.    Yes Historical Provider, MD  cholecalciferol (VITAMIN D) 1000 units tablet Take 1,000 Units by mouth daily.   Yes Historical Provider, MD  donepezil (ARICEPT) 10 MG tablet Take 1 tablet (10 mg total) by mouth at bedtime. 09/14/16  Yes York Spanielharles K Willis, MD  esomeprazole (NEXIUM) 40 MG capsule Take 40 mg by mouth 2 (two) times daily before a meal.    Yes Historical Provider, MD  FLUoxetine (PROZAC) 20 MG capsule Take 40 mg by mouth daily.    Yes Historical Provider, MD    Family History Family History  Problem Relation Age of Onset  . Sleep apnea Father   . Alcohol abuse Father   . Stroke Father   . Dementia Mother   . Parkinsonism Sister     Social History Social History  Substance Use Topics  . Smoking status: Never Smoker  . Smokeless tobacco: Never Used  . Alcohol use No     Allergies   Patient has no known allergies.   Review of Systems Review of  Systems  Constitutional: Positive for activity change, appetite change and fatigue. Negative for fever.  HENT: Negative for sore throat.   Eyes: Negative for visual disturbance.  Respiratory: Positive for cough. Negative for shortness of breath.   Cardiovascular: Negative for chest pain.  Gastrointestinal: Positive for constipation, nausea and vomiting. Negative for abdominal pain and diarrhea.  Genitourinary: Negative for difficulty urinating.  Musculoskeletal: Negative for back pain and neck pain.  Skin: Negative for rash.  Neurological: Negative for syncope and headaches.     Physical Exam Updated Vital Signs BP (!) 154/96   Pulse 76   Temp 98.4 F (36.9 C) (Axillary) Comment (Src): pt was nauseated  Resp 18   Ht 5\' 7"  (1.702 m)   Wt 170 lb (77.1 kg)    SpO2 97%   BMI 26.63 kg/m   Physical Exam  Constitutional: She appears well-developed. She appears ill. No distress.  HENT:  Head: Normocephalic.  Large facial contusions  Eyes: Conjunctivae and EOM are normal.  Neck: Normal range of motion.  Cardiovascular: Normal rate, regular rhythm, normal heart sounds and intact distal pulses.  Exam reveals no gallop and no friction rub.   No murmur heard. Pulmonary/Chest: Effort normal and breath sounds normal. No respiratory distress. She has no wheezes. She has no rales.  Abdominal: Soft. She exhibits no distension. There is no tenderness. There is no guarding.  Musculoskeletal: She exhibits no edema or tenderness.  Neurological: She is alert.  Normal bilateral strength, symmetric facies, normal sensation, midline tongue/uvula  Skin: Skin is warm and dry. No rash noted. She is not diaphoretic. No erythema.  Nursing note and vitals reviewed.    ED Treatments / Results  Labs (all labs ordered are listed, but only abnormal results are displayed) Labs Reviewed  CBC WITH DIFFERENTIAL/PLATELET - Abnormal; Notable for the following:       Result Value   RBC 5.39 (*)    Hemoglobin 15.2 (*)    All other components within normal limits  COMPREHENSIVE METABOLIC PANEL - Abnormal; Notable for the following:    Potassium 2.9 (*)    Chloride 98 (*)    Glucose, Bld 141 (*)    Calcium 12.4 (*)    Total Bilirubin 1.4 (*)    All other components within normal limits  URINALYSIS, ROUTINE W REFLEX MICROSCOPIC - Abnormal; Notable for the following:    Color, Urine AMBER (*)    APPearance CLOUDY (*)    Protein, ur 30 (*)    Bacteria, UA RARE (*)    Squamous Epithelial / LPF 6-30 (*)    All other components within normal limits  URINE CULTURE  LIPASE, BLOOD  INFLUENZA PANEL BY PCR (TYPE A & B)  MAGNESIUM  CBC  BASIC METABOLIC PANEL  CALCIUM, IONIZED  I-STAT CG4 LACTIC ACID, ED  Rosezena Sensor, ED    EKG  EKG Interpretation None        Radiology Dg Chest 2 View  Result Date: 11/06/2016 CLINICAL DATA:  Fall EXAM: CHEST  2 VIEW COMPARISON:  10/13/2016 FINDINGS: Lingular scarring or atelectasis. Mild cardiomegaly. Low lung volumes. No focal opacity on the right. No effusions or acute bony abnormality. IMPRESSION: Lingular scarring or atelectasis.  Low lung volumes. Electronically Signed   By: Charlett Nose M.D.   On: 11/06/2016 14:10   Ct Head Wo Contrast  Result Date: 11/06/2016 CLINICAL DATA:  Forehead and facial bruising after falling last week. Increasing confusion for 1 month. Vomiting. EXAM: CT HEAD WITHOUT CONTRAST CT  CERVICAL SPINE WITHOUT CONTRAST TECHNIQUE: Multidetector CT imaging of the head and cervical spine was performed following the standard protocol without intravenous contrast. Multiplanar CT image reconstructions of the cervical spine were also generated. COMPARISON:  CT head 10/13/2016.  Chest CT 12/20/2015. FINDINGS: CT HEAD FINDINGS Despite efforts by the technologist and patient, motion artifact is present on today's exam and could not be eliminated. This reduces exam sensitivity and specificity. Repeat images of the head are of better quality. Brain: There is no evidence of acute intracranial hemorrhage, mass lesion, brain edema or extra-axial fluid collection. There is advanced atrophy with diffuse prominence of the ventricles and subarachnoid spaces. Chronic low-density in the periventricular white matter appears stable, likely due to chronic small vessel ischemic changes. There is no CT evidence of acute cortical infarction. Vascular: No hyperdense vessel demonstrated. Intracranial vascular calcifications are noted. Skull: No evidence of acute calvarial fracture. There is left frontal scalp swelling. 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 Sinuses/Orbits: The visualized paranasal sinuses and mastoid air cells are clear. No orbital abnormalities are seen. Other: None. CT CERVICAL SPINE FINDINGS Alignment:  Normal. Skull base and vertebrae: No evidence of acute fracture or traumatic subluxation. Soft tissues and spinal canal: No soft tissue swelling or visible canal hematoma. Carotid atherosclerosis present bilaterally. Disc levels: There is relatively mild multilevel disc space narrowing and uncinate spurring, greatest at C5-6 and C6-7. No large disc herniation or significant spinal stenosis seen. Upper chest: Appears stable. The right thyroid lobe is replaced by a 2.6 cm nodule. There is a smaller left thyroid nodule. Other: None. IMPRESSION: 1. Focal right frontal scalp soft tissue injury. No evidence of calvarial fracture or acute intracranial process. 2. Stable atrophy and chronic small vessel ischemic changes. 3. No evidence of acute cervical spine fracture, traumatic subluxation or static signs of instability. Electronically Signed   By: Carey Bullocks M.D.   On: 11/06/2016 14:48   Ct Cervical Spine Wo Contrast  Result Date: 11/06/2016 CLINICAL DATA:  Forehead and facial bruising after falling last week. Increasing confusion for 1 month. Vomiting. EXAM: CT HEAD WITHOUT CONTRAST CT CERVICAL SPINE WITHOUT CONTRAST TECHNIQUE: Multidetector CT imaging of the head and cervical spine was performed following the standard protocol without intravenous contrast. Multiplanar CT image reconstructions of the cervical spine were also generated. COMPARISON:  CT head 10/13/2016.  Chest CT 12/20/2015. FINDINGS: CT HEAD FINDINGS Despite efforts by the technologist and patient, motion artifact is present on today's exam and could not be eliminated. This reduces exam sensitivity and specificity. Repeat images of the head are of better quality. Brain: There is no evidence of acute intracranial hemorrhage, mass lesion, brain edema or extra-axial fluid collection. There is advanced atrophy with diffuse prominence of the ventricles and subarachnoid spaces. Chronic low-density in the periventricular white matter appears stable,  likely due to chronic small vessel ischemic changes. There is no CT evidence of acute cortical infarction. Vascular: No hyperdense vessel demonstrated. Intracranial vascular calcifications are noted. Skull: No evidence of acute calvarial fracture. There is left frontal scalp swelling. 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 Sinuses/Orbits: The visualized paranasal sinuses and mastoid air cells are clear. No orbital abnormalities are seen. Other: None. CT CERVICAL SPINE FINDINGS Alignment: Normal. Skull base and vertebrae: No evidence of acute fracture or traumatic subluxation. Soft tissues and spinal canal: No soft tissue swelling or visible canal hematoma. Carotid atherosclerosis present bilaterally. Disc levels: There is relatively mild multilevel disc space narrowing and uncinate spurring, greatest at C5-6 and  C6-7. No large disc herniation or significant spinal stenosis seen. Upper chest: Appears stable. The right thyroid lobe is replaced by a 2.6 cm nodule. There is a smaller left thyroid nodule. Other: None. IMPRESSION: 1. Focal right frontal scalp soft tissue injury. No evidence of calvarial fracture or acute intracranial process. 2. Stable atrophy and chronic small vessel ischemic changes. 3. No evidence of acute cervical spine fracture, traumatic subluxation or static signs of instability. Electronically Signed   By: Carey Bullocks M.D.   On: 11/06/2016 14:48    Procedures Procedures (including critical care time)  Medications Ordered in ED Medications  aspirin EC tablet 325 mg (325 mg Oral Given 11/06/16 1837)  donepezil (ARICEPT) tablet 10 mg (not administered)  atorvastatin (LIPITOR) tablet 10 mg (10 mg Oral Given 11/06/16 1836)  buPROPion (WELLBUTRIN XL) 24 hr tablet 150 mg (not administered)  pantoprazole (PROTONIX) EC tablet 40 mg (not administered)  FLUoxetine (PROZAC) capsule 40 mg (not administered)  acetaminophen (TYLENOL) tablet 650 mg (not administered)    Or  acetaminophen  (TYLENOL) suppository 650 mg (not administered)  ondansetron (ZOFRAN) tablet 4 mg (not administered)    Or  ondansetron (ZOFRAN) injection 4 mg (not administered)  sodium chloride 0.9 % 1,000 mL with potassium chloride 80 mEq infusion ( Intravenous New Bag/Given 11/06/16 1902)  pneumococcal 23 valent vaccine (PNU-IMMUNE) injection 0.5 mL (not administered)  sodium chloride 0.9 % bolus 1,000 mL (0 mLs Intravenous Stopped 11/06/16 1655)  ondansetron (ZOFRAN) injection 4 mg (4 mg Intravenous Given 11/06/16 1444)     Initial Impression / Assessment and Plan / ED Course  I have reviewed the triage vital signs and the nursing notes.  Pertinent labs & imaging results that were available during my care of the patient were reviewed by me and considered in my medical decision making (see chart for details).      76 year old female with a history of dementia, diabetes, hyperparathyroidism, hyperlipidemia who presents with concern for generalized weakness, with new nausea and vomiting beginning today.  Patient had been evaluated by neurology for this significant change in her functional status over the last 4 weeks.  Patient is hemodynamically stable today, however exhibits signs of dehydration.  Regarding her nausea and vomiting, EKG and troponin within normal limits. CT head shows no sign of acute bleed. Urinalysis shows no signs of infection. Abdominal exam is benign, nontender, and have low suspicion for acute obstruction. Patient also has constipation.  Patient with severe generalized weakness. There are no focal neurologic deficits on exam. Her chest x-ray shows no sign of pneumonia. Would consider influenza time of season given cough and nausea vomiting, however patient is afebrile. Labs show hypokalemia as well as hypercalcemia. Hypercalcemia is increased in comparison to her typical hyperparathyroidism. This may be secondary to hemoconcentration, or worsening of her hypercalcemia with a corrected  calcium of 12.6.  Patient with vomiting despite nausea medications, difficulty swallowing large pills, and will treat her hypokalemia with IV potassium and fluids. Patient will be admitted for further care concern of dehydration, generalized weakness with vomiting, hypercalcemia and hypokalemia. Neurology had begun labwork for outpt eval of increasing functional difficulties and wanted to do MRI.   Final Clinical Impressions(s) / ED Diagnoses   Final diagnoses:  Generalized weakness  Hypokalemia  Hypercalcemia  Nausea and vomiting, intractability of vomiting not specified, unspecified vomiting type    New Prescriptions Current Discharge Medication List       Alvira Monday, MD 11/06/16 1926

## 2016-11-06 NOTE — ED Triage Notes (Signed)
Pt brought in by EMS due to having generalized weakness that started this am. Pt has hx of dementia and is a&ox1. Pt lives with husband. Pt has vomited 3-4 times in the past day. Pt denies pain or nausea.

## 2016-11-06 NOTE — Progress Notes (Signed)
Blood pressure (!) 164/72, pulse 67, provider on call text paged to notify. No distress noted. Will continue to monitor the patient. Julien NordmannJackson, Xzayvier Fagin [facility]

## 2016-11-06 NOTE — H&P (Signed)
History and Physical  Akila Batta Folger LKG:[id number] DOB: [birth year] DOA: 11/06/2016  Referring physician: Dr Susie Cassette, ED physician PCP: Gaye Alken, MD  Outpatient Specialists:   Dr Anne Hahn (Neuro)  Patient Coming From: home  Chief Complaint: Weakness, vomiting, cough  HPI: Christine Webster is a 76 y.o. female with a history of dementia, diabetes mellitus diet controlled, hyperparathyroidism, hyperlipidemia. Patient has had diminished appetite over the past 3-4 weeks with acute onset of nausea and vomiting over the past 24-48 hours. Patient has had several episodes of emesis that is described as stomach contents. No palliating or provoking factors. She denies abdominal pain, dysuria. Her oral intake has been limited to a few bites of yogurt or other soft foods with minimal oral intake. Denies fevers, chills, sick contacts.  She was seen by Dr. Anne Hahn from urology on the 24th, who ordered thyroid studies, ammonia level, HIV, ANA, and a few antibodies to look for other reasons of patient's weakness. These all returned normal.  Emergency Department Course: CT of the head was negative, UA negative, CMP shows a potassium of 2.9 and calcium of 12.4 (elevated from baseline of 10). She received IV fluids in ED.  Review of Systems:   Pt denies any fevers, chills, diarrhea, constipation, abdominal pain, shortness of breath, dyspnea on exertion, orthopnea, wheezing, palpitations, headache, vision changes, lightheadedness, dizziness, melena, rectal bleeding.  Review of systems are otherwise negative  Past Medical History:  Diagnosis Date  . Abnormality of gait 12/31/2015  . Colon polyps   . Depression   . Diabetes mellitus without complication (HCC)   . Genital herpes   . GERD (gastroesophageal reflux disease)   . Hyperlipidemia   . Hyperparathyroidism (HCC)    Dr. Talmage Nap  . Hypertension   . Kidney stones   . Memory disorder 12/31/2015  . Obesity   . Vitamin D  deficiency    Past Surgical History:  Procedure Laterality Date  . INTESTINAL MALROTATION REPAIR    . NISSEN FUNDOPLICATION     for huge hiatal hernia 07/2008  . thyroid tumor     benign   Social History:  reports that she has never smoked. She has never used smokeless tobacco. She reports that she does not drink alcohol or use drugs. Patient lives at home  No Known Allergies  Family History  Problem Relation Age of Onset  . Sleep apnea Father   . Alcohol abuse Father   . Stroke Father   . Dementia Mother   . Parkinsonism Sister      Prior to Admission medications   Medication Sig Start Date End Date Taking? Authorizing Provider  aspirin EC 325 MG tablet Take 325 mg by mouth every evening.   Yes Historical Provider, MD  atorvastatin (LIPITOR) 10 MG tablet Take 10 mg by mouth every evening.    Yes Historical Provider, MD  buPROPion (WELLBUTRIN XL) 150 MG 24 hr tablet Take 150 mg by mouth daily.    Yes Historical Provider, MD  cholecalciferol (VITAMIN D) 1000 units tablet Take 1,000 Units by mouth daily.   Yes Historical Provider, MD  donepezil (ARICEPT) 10 MG tablet Take 1 tablet (10 mg total) by mouth at bedtime. 09/14/16  Yes York Spaniel, MD  esomeprazole (NEXIUM) 40 MG capsule Take 40 mg by mouth 2 (two) times daily before a meal.    Yes Historical Provider, MD  FLUoxetine (PROZAC) 20 MG capsule Take 40 mg by mouth daily.    Yes Historical Provider, MD  Physical Exam: BP 168/99   Pulse 80   Temp 98.3 F (36.8 C) (Oral)   Resp 17   Ht 5\' 7"  (1.702 m)   Wt 77.1 kg (170 lb)   SpO2 97%   BMI 26.63 kg/m   General: Elderly Caucasian female. Awake and alert and oriented x3. No acute cardiopulmonary distress.  HEENT: Normocephalic atraumatic.  Right and left ears normal in appearance.  Pupils equal, round, reactive to light. Extraocular muscles are intact. Sclerae anicteric and noninjected.  Moist mucosal membranes. No mucosal lesions.  Neck: Neck supple without  lymphadenopathy. No carotid bruits. No masses palpated.  Cardiovascular: Regular rate with normal S1-S2 sounds. No murmurs, rubs, gallops auscultated. No JVD.  Respiratory: Good respiratory effort with no wheezes, rales, rhonchi. Lungs clear to auscultation bilaterally.  No accessory muscle use. Abdomen: Soft, nontender, nondistended. Active bowel sounds. No masses or hepatosplenomegaly  Skin: No rashes, lesions, or ulcerations.  Dry, warm to touch. 2+ dorsalis pedis and radial pulses. Musculoskeletal: No calf or leg pain. All major joints not erythematous nontender.  No upper or lower joint deformation.  Good ROM.  No contractures  Psychiatric: Intact judgment and insight. Pleasant and cooperative. Neurologic: No focal neurological deficits. Strength is 5/5 and symmetric in upper and lower extremities.  Cranial nerves II through XII are grossly intact.           Labs on Admission: I have personally reviewed following labs and imaging studies  CBC:  Recent Labs Lab 11/06/16 1334  WBC 8.1  NEUTROABS 5.9  HGB 15.2*  HCT 44.3  MCV 82.2  PLT 237   Basic Metabolic Panel:  Recent Labs Lab 11/04/16 1342 11/06/16 1334  NA 141 138  K 3.1* 2.9*  CL 97 98*  CO2 27 27  GLUCOSE 149* 141*  BUN 7* 7  CREATININE 0.78 0.84  CALCIUM 10.8* 12.4*   GFR: Estimated Creatinine Clearance: 63.8 mL/min (by C-G formula based on SCr of 0.84 mg/dL). Liver Function Tests:  Recent Labs Lab 11/04/16 1342 11/06/16 1334  AST 15 22  ALT 14 17  ALKPHOS 103 82  BILITOT 1.0 1.4*  PROT 6.9 6.5  ALBUMIN 4.3 3.7    Recent Labs Lab 11/06/16 1334  LIPASE 19    Recent Labs Lab 11/04/16 1342  AMMONIA 73   Coagulation Profile: No results for input(s): INR, PROTIME in the last 168 hours. Cardiac Enzymes: No results for input(s): CKTOTAL, CKMB, CKMBINDEX, TROPONINI in the last 168 hours. BNP (last 3 results) No results for input(s): PROBNP in the last 8760 hours. HbA1C: No results for  input(s): HGBA1C in the last 72 hours. CBG: No results for input(s): GLUCAP in the last 168 hours. Lipid Profile: No results for input(s): CHOL, HDL, LDLCALC, TRIG, CHOLHDL, LDLDIRECT in the last 72 hours. Thyroid Function Tests: No results for input(s): TSH, T4TOTAL, FREET4, T3FREE, THYROIDAB in the last 72 hours. Anemia Panel: No results for input(s): VITAMINB12, FOLATE, FERRITIN, TIBC, IRON, RETICCTPCT in the last 72 hours. Urine analysis:    Component Value Date/Time   COLORURINE AMBER (A) 11/06/2016 1450   APPEARANCEUR CLOUDY (A) 11/06/2016 1450   LABSPEC 1.015 11/06/2016 1450   PHURINE 7.0 11/06/2016 1450   GLUCOSEU NEGATIVE 11/06/2016 1450   HGBUR NEGATIVE 11/06/2016 1450   BILIRUBINUR NEGATIVE 11/06/2016 1450   KETONESUR NEGATIVE 11/06/2016 1450   PROTEINUR 30 (A) 11/06/2016 1450   UROBILINOGEN 1.0 01/09/2010 1720   NITRITE NEGATIVE 11/06/2016 1450   LEUKOCYTESUR NEGATIVE 11/06/2016 1450   Sepsis Labs: @  LABRCNTIP(procalcitonin:4,lacticidven:4) )No results found for this or any previous visit (from the past 240 hour(s)).   Radiological Exams on Admission: Dg Chest 2 View  Result Date: 11/06/2016 CLINICAL DATA:  Fall EXAM: CHEST  2 VIEW COMPARISON:  10/13/2016 FINDINGS: Lingular scarring or atelectasis. Mild cardiomegaly. Low lung volumes. No focal opacity on the right. No effusions or acute bony abnormality. IMPRESSION: Lingular scarring or atelectasis.  Low lung volumes. Electronically Signed   By: Charlett Nose M.D.   On: 11/06/2016 14:10   Ct Head Wo Contrast  Result Date: 11/06/2016 CLINICAL DATA:  Forehead and facial bruising after falling last week. Increasing confusion for 1 month. Vomiting. EXAM: CT HEAD WITHOUT CONTRAST CT CERVICAL SPINE WITHOUT CONTRAST TECHNIQUE: Multidetector CT imaging of the head and cervical spine was performed following the standard protocol without intravenous contrast. Multiplanar CT image reconstructions of the cervical spine were also  generated. COMPARISON:  CT head 10/13/2016.  Chest CT 12/20/2015. FINDINGS: CT HEAD FINDINGS Despite efforts by the technologist and patient, motion artifact is present on today's exam and could not be eliminated. This reduces exam sensitivity and specificity. Repeat images of the head are of better quality. Brain: There is no evidence of acute intracranial hemorrhage, mass lesion, brain edema or extra-axial fluid collection. There is advanced atrophy with diffuse prominence of the ventricles and subarachnoid spaces. Chronic low-density in the periventricular white matter appears stable, likely due to chronic small vessel ischemic changes. There is no CT evidence of acute cortical infarction. Vascular: No hyperdense vessel demonstrated. Intracranial vascular calcifications are noted. Skull: No evidence of acute calvarial fracture. There is left frontal scalp swelling. 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 Sinuses/Orbits: The visualized paranasal sinuses and mastoid air cells are clear. No orbital abnormalities are seen. Other: None. CT CERVICAL SPINE FINDINGS Alignment: Normal. Skull base and vertebrae: No evidence of acute fracture or traumatic subluxation. Soft tissues and spinal canal: No soft tissue swelling or visible canal hematoma. Carotid atherosclerosis present bilaterally. Disc levels: There is relatively mild multilevel disc space narrowing and uncinate spurring, greatest at C5-6 and C6-7. No large disc herniation or significant spinal stenosis seen. Upper chest: Appears stable. The right thyroid lobe is replaced by a 2.6 cm nodule. There is a smaller left thyroid nodule. Other: None. IMPRESSION: 1. Focal right frontal scalp soft tissue injury. No evidence of calvarial fracture or acute intracranial process. 2. Stable atrophy and chronic small vessel ischemic changes. 3. No evidence of acute cervical spine fracture, traumatic subluxation or static signs of instability. Electronically Signed   By:  Carey Bullocks M.D.   On: 11/06/2016 14:48   Ct Cervical Spine Wo Contrast  Result Date: 11/06/2016 CLINICAL DATA:  Forehead and facial bruising after falling last week. Increasing confusion for 1 month. Vomiting. EXAM: CT HEAD WITHOUT CONTRAST CT CERVICAL SPINE WITHOUT CONTRAST TECHNIQUE: Multidetector CT imaging of the head and cervical spine was performed following the standard protocol without intravenous contrast. Multiplanar CT image reconstructions of the cervical spine were also generated. COMPARISON:  CT head 10/13/2016.  Chest CT 12/20/2015. FINDINGS: CT HEAD FINDINGS Despite efforts by the technologist and patient, motion artifact is present on today's exam and could not be eliminated. This reduces exam sensitivity and specificity. Repeat images of the head are of better quality. Brain: There is no evidence of acute intracranial hemorrhage, mass lesion, brain edema or extra-axial fluid collection. There is advanced atrophy with diffuse prominence of the ventricles and subarachnoid spaces. Chronic low-density in the periventricular white matter  appears stable, likely due to chronic small vessel ischemic changes. There is no CT evidence of acute cortical infarction. Vascular: No hyperdense vessel demonstrated. Intracranial vascular calcifications are noted. Skull: No evidence of acute calvarial fracture. There is left frontal scalp swelling. 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 Sinuses/Orbits: The visualized paranasal sinuses and mastoid air cells are clear. No orbital abnormalities are seen. Other: None. CT CERVICAL SPINE FINDINGS Alignment: Normal. Skull base and vertebrae: No evidence of acute fracture or traumatic subluxation. Soft tissues and spinal canal: No soft tissue swelling or visible canal hematoma. Carotid atherosclerosis present bilaterally. Disc levels: There is relatively mild multilevel disc space narrowing and uncinate spurring, greatest at C5-6 and C6-7. No large disc herniation  or significant spinal stenosis seen. Upper chest: Appears stable. The right thyroid lobe is replaced by a 2.6 cm nodule. There is a smaller left thyroid nodule. Other: None. IMPRESSION: 1. Focal right frontal scalp soft tissue injury. No evidence of calvarial fracture or acute intracranial process. 2. Stable atrophy and chronic small vessel ischemic changes. 3. No evidence of acute cervical spine fracture, traumatic subluxation or static signs of instability. Electronically Signed   By: Carey Bullocks M.D.   On: 11/06/2016 14:48    Assessment/Plan: Principal Problem:   Dehydration Active Problems:   Memory disorder   Failure to thrive in adult   Hypokalemia   Diabetes mellitus without complication (HCC)   GERD (gastroesophageal reflux disease)   Hyperparathyroidism (HCC)   Vomiting    This patient was discussed with the ED physician, including pertinent vitals, physical exam findings, labs, and imaging.  We also discussed care given by the ED provider.  #1 dehydration  Observation, fluid hydration with normal saline with 80 mg once a potassium at 100 mLper hour #2 failure to thrive in adult  Nutrition consult  PT, case management consults  Once her nausea and vomiting improve, possibly start ensure or appetite stimulant such as Remeron #3 hypokalemia  We'll replace potassium #4 vomiting  We'll check flu  The percussions  Zofran #5 diabetes type 2 without complication-diet controlled  Continue diet controlled #6 hyperparathyroidism  Had recent calcium at her baseline  Possibly elevated due to fluid contraction  Recheck tomorrow #7 dementia  Into the home medications  DVT prophylaxis: Patient is a fall risk - will place on SCDs Consultants: None Code Status: Full code Family Communication: Husband  Disposition Plan: Pending   Levie Heritage, DO [facility] Pager [phone]  If 7PM-7AM, please contact night-coverage [email or web address] Password  [email or web address]

## 2016-11-07 ENCOUNTER — Observation Stay (HOSPITAL_COMMUNITY): Payer: Medicare Other

## 2016-11-07 DIAGNOSIS — K5900 Constipation, unspecified: Secondary | ICD-10-CM | POA: Diagnosis present

## 2016-11-07 DIAGNOSIS — K5904 Chronic idiopathic constipation: Secondary | ICD-10-CM

## 2016-11-07 LAB — BASIC METABOLIC PANEL
ANION GAP: 12 (ref 5–15)
BUN: 6 mg/dL (ref 6–20)
CHLORIDE: 104 mmol/L (ref 101–111)
CO2: 25 mmol/L (ref 22–32)
Calcium: 10.5 mg/dL — ABNORMAL HIGH (ref 8.9–10.3)
Creatinine, Ser: 0.82 mg/dL (ref 0.44–1.00)
GFR calc non Af Amer: >60 mL/min (ref >60)
Glucose, Bld: 116 mg/dL — ABNORMAL HIGH (ref 65–99)
Potassium: 3.6 mmol/L (ref 3.5–5.1)
Sodium: 141 mmol/L (ref 135–145)

## 2016-11-07 LAB — URINE CULTURE: CULTURE: NO GROWTH

## 2016-11-07 LAB — CBC
HEMATOCRIT: 38.6 % (ref 36.0–46.0)
HEMOGLOBIN: 12.5 g/dL (ref 12.0–15.0)
MCH: 27.1 pg (ref 26.0–34.0)
MCHC: 32.4 g/dL (ref 30.0–36.0)
MCV: 83.5 fL (ref 78.0–100.0)
Platelets: 233 10*3/uL (ref 150–400)
RBC: 4.62 MIL/uL (ref 3.87–5.11)
RDW: 14.2 % (ref 11.5–15.5)
WBC: 7.2 10*3/uL (ref 4.0–10.5)

## 2016-11-07 MED ORDER — BISACODYL 10 MG RE SUPP
10.0000 mg | Freq: Once | RECTAL | Status: AC
  Administered 2016-11-07: 10 mg via RECTAL
  Filled 2016-11-07: qty 1

## 2016-11-07 MED ORDER — POLYETHYLENE GLYCOL 3350 17 G PO PACK: 17.0000 g | PACK | Freq: Every day | ORAL | 0 refills | Status: AC

## 2016-11-07 MED ORDER — MIRTAZAPINE 15 MG PO TABS: 15.0000 mg | ORAL_TABLET | Freq: Every day | ORAL | 0 refills | Status: AC

## 2016-11-07 MED ORDER — FLEET ENEMA 7-19 GM/118ML RE ENEM: 1.0000 | ENEMA | Freq: Every day | RECTAL | 0 refills | Status: AC | PRN

## 2016-11-07 MED ORDER — POTASSIUM CHLORIDE ER 10 MEQ PO TBCR: 10.0000 meq | EXTENDED_RELEASE_TABLET | Freq: Every day | ORAL | 0 refills | Status: DC

## 2016-11-07 MED ORDER — DOCUSATE SODIUM 100 MG PO CAPS: 100.0000 mg | ORAL_CAPSULE | Freq: Two times a day (BID) | ORAL | 0 refills | Status: AC

## 2016-11-07 MED ORDER — ADULT MULTIVITAMIN W/MINERALS CH
1.0000 | ORAL_TABLET | Freq: Every day | ORAL | Status: DC
  Administered 2016-11-07: 1 via ORAL
  Filled 2016-11-07: qty 1

## 2016-11-07 MED ORDER — ENSURE ENLIVE PO LIQD: 237.0000 mL | Freq: Two times a day (BID) | ORAL | 12 refills | Status: AC

## 2016-11-07 MED ORDER — FLEET ENEMA 7-19 GM/118ML RE ENEM: 1.0000 | ENEMA | Freq: Once | RECTAL | Status: DC

## 2016-11-07 MED ORDER — POLYETHYLENE GLYCOL 3350 17 G PO PACK
17.0000 g | PACK | Freq: Two times a day (BID) | ORAL | Status: DC
  Administered 2016-11-07: 17 g via ORAL
  Filled 2016-11-07: qty 1

## 2016-11-07 MED ORDER — SENNA 8.6 MG PO TABS: 1.0000 | ORAL_TABLET | Freq: Every day | ORAL | 0 refills | Status: AC

## 2016-11-07 MED ORDER — MIRTAZAPINE 15 MG PO TABS: 15.0000 mg | ORAL_TABLET | Freq: Every day | ORAL | Status: DC

## 2016-11-07 MED ORDER — SENNOSIDES-DOCUSATE SODIUM 8.6-50 MG PO TABS
1.0000 | ORAL_TABLET | Freq: Two times a day (BID) | ORAL | Status: DC
  Administered 2016-11-07: 1 via ORAL
  Filled 2016-11-07: qty 1

## 2016-11-07 MED ORDER — ONDANSETRON 4 MG PO TBDP: 4.0000 mg | ORAL_TABLET | Freq: Three times a day (TID) | ORAL | 0 refills | Status: AC | PRN

## 2016-11-07 MED ORDER — SENNOSIDES-DOCUSATE SODIUM 8.6-50 MG PO TABS: 1.0000 | ORAL_TABLET | Freq: Two times a day (BID) | ORAL | 0 refills | Status: DC

## 2016-11-07 MED ORDER — ENSURE ENLIVE PO LIQD
237.0000 mL | Freq: Two times a day (BID) | ORAL | Status: DC
  Administered 2016-11-07: 237 mL via ORAL

## 2016-11-07 NOTE — Discharge Instructions (Signed)
Constipation Follow these instructions at home: Diet  Eat foods that have a lot of fiber. These include fruits, vegetables, whole grains, and beans. Limit foods high in fat and processed sugars. These include french fries, hamburgers, cookies, and candy.  Take a fiber supplement as directed. If you are not taking a fiber supplement and think that you are not getting enough fiber from foods, talk to your health care provider about adding a fiber supplement to your diet.  Drink clear fluids, especially water. Avoid drinking alcohol, caffeine, and soda. These can make constipation worse.  Drink enough fluids to keep your urine clear or pale yellow. Activity  Start walking as soon as you can. Try to go a little farther each day.  Once your health care provider approves, do some sort of regular exercise. This helps prevent constipation. Bowel Movements  Go to the restroom when you have the urge to go. Do not hold it in.  Try drinking something hot to get a bowel movement started.  Keep track of how often you use the restroom. If you miss 2-3 bowel movements, talk to your health care provider about medicines that prevent constipation. Your health care provider may suggest a stool softener, laxative, or fiber supplement.  Only take over-the-counter or prescription medicines as directed by your health care provider.  Do not take other medicines without talking to your health care provider first. If you become constipated and take a medicine to make you have a bowel movement, the problem may get worse. Other kinds of medicine can also make the problem worse. Contact a health care provider if:  You used stool softeners or laxatives and still have not had a bowel movement within 24-48 hours after using them.  You have not had a bowel movement in 3 days. Get help right away if:  Your constipation lasts for more than 4 days or gets worse.  You have bright red blood in your stool.  You have  abdominal or rectal pain.  You have very bad cramping.  You have thin, pencil-like stools.  You have unexplained weight loss.  You have a fever or persistent symptoms for more than 2-3 days.  You have a fever and your symptoms suddenly get worse. This information is not intended to replace advice given to you by your health care provider. Make sure you discuss any questions you have with your health care provider. Document Released: 01/23/2013 Document Revised: 03/05/2016 Document Reviewed: 11/11/2012 Elsevier Interactive Patient Education  2017 [employer] Potassium Content of Foods Potassium is a mineral found in many foods and drinks. It helps keep fluids and minerals balanced in your body and affects how steadily your heart beats. Potassium also helps control your blood pressure and keep your muscles and nervous system healthy. Certain health conditions and medicines may change the balance of potassium in your body. When this happens, you can help balance your level of potassium through the foods that you do or do not eat. Your health care provider or dietitian may recommend an amount of potassium that you should have each day. The following lists of foods provide the amount of potassium (in parentheses) per serving in each item. High in potassium The following foods and beverages have 200 mg or more of potassium per serving:  Apricots, 2 raw or 5 dry (200 mg).  Artichoke, 1 medium (345 mg).  Avocado, raw,  each (245 mg).  Banana, 1 medium (425 mg).  Beans, lima, or baked beans, canned,  cup (280 mg).  Beans, white, canned,  cup (595 mg).  Beef roast, 3 oz (320 mg).  Beef, ground, 3 oz (270 mg).  Beets, raw or cooked,  cup (260 mg).  Bran muffin, 2 oz (300 mg).  Broccoli,  cup (230 mg).  Brussels sprouts,  cup (250 mg).  Cantaloupe,  cup (215 mg).  Cereal, 100% bran,  cup (200-400 mg).  Cheeseburger, single, fast food, 1 each (225-400 mg).  Chicken, 3  oz (220 mg).  Clams, canned, 3 oz (535 mg).  Crab, 3 oz (225 mg).  Dates, 5 each (270 mg).  Dried beans and peas,  cup (300-475 mg).  Figs, dried, 2 each (260 mg).  Fish: halibut, tuna, cod, snapper, 3 oz (480 mg).  Fish: salmon, haddock, swordfish, perch, 3 oz (300 mg).  Fish, tuna, canned 3 oz (200 mg).  [place] fries, fast food, 3 oz (470 mg).  Granola with fruit and nuts,  cup (200 mg).  Grapefruit juice,  cup (200 mg).  Greens, beet,  cup (655 mg).  Honeydew melon,  cup (200 mg).  Kale, raw, 1 cup (300 mg).  Kiwi, 1 medium (240 mg).  Kohlrabi, rutabaga, parsnips,  cup (280 mg).  Lentils,  cup (365 mg).  Mango, 1 each (325 mg).  Milk, chocolate, 1 cup (420 mg).  Milk: nonfat, low-fat, whole, buttermilk, 1 cup (350-380 mg).  Molasses, 1 Tbsp (295 mg).  Mushrooms,  cup (280) mg.  Nectarine, 1 each (275 mg).  Nuts: almonds, peanuts, hazelnuts, [place], cashew, mixed, 1 oz (200 mg).  Nuts, pistachios, 1 oz (295 mg).  Orange, 1 each (240 mg).  Orange juice,  cup (235 mg).  Papaya, medium,  fruit (390 mg).  Peanut butter, chunky, 2 Tbsp (240 mg).  Peanut butter, smooth, 2 Tbsp (210 mg).  Pear, 1 medium (200 mg).  Pomegranate, 1 whole (400 mg).  Pomegranate juice,  cup (215 mg).  Pork, 3 oz (350 mg).  Potato chips, salted, 1 oz (465 mg).  Potato, baked with skin, 1 medium (925 mg).  Potatoes, boiled,  cup (255 mg).  Potatoes, mashed,  cup (330 mg).  Prune juice,  cup (370 mg).  Prunes, 5 each (305 mg).  Pudding, chocolate,  cup (230 mg).  Pumpkin, canned,  cup (250 mg).  Raisins, seedless,  cup (270 mg).  Seeds, sunflower or pumpkin, 1 oz (240 mg).  Soy milk, 1 cup (300 mg).  Spinach,  cup (420 mg).  Spinach, canned,  cup (370 mg).  Sweet potato, baked with skin, 1 medium (450 mg).  Swiss chard,  cup (480 mg).  Tomato or vegetable juice,  cup (275 mg).  Tomato sauce or puree,  cup (400-550  mg).  Tomato, raw, 1 medium (290 mg).  Tomatoes, canned,  cup (200-300 mg).  [place], 3 oz (250 mg).  Wheat germ, 1 oz (250 mg).  Winter squash,  cup (250 mg).  Yogurt, plain or fruited, 6 oz (260-435 mg).  Zucchini,  cup (220 mg).

## 2016-11-07 NOTE — Progress Notes (Signed)
Initial Nutrition Assessment  DOCUMENTATION CODES:  Not applicable  INTERVENTION:  Obtained lunch Request of hotdog, feel appropriate given reported inimal Po intake.   Ensure Enlive po BID, each supplement provides 350 kcal and 20 grams of protein  Multivitamin with minerals given reported history of very poor PO intake w/o supplementation.  NUTRITION DIAGNOSIS:  Inadequate oral intake related to lethargy/confusion, chronic illness (Dementia?) as evidenced by a reported PO intake that met < or equal to 75% of estimated needs for > or equal to 1 month.  GOAL:  Patient will meet greater than or equal to 90% of their needs  MONITOR:  PO intake, Supplement acceptance, Labs  REASON FOR ASSESSMENT:  Consult  ("FTT in adult")  ASSESSMENT:  76 y/o female PMHx dementia, DM2, HLD, GERD, HTN. Presented with diminished appetite x 3-4 weeks w/ acute onset n/v x 24-48 hrs.  Admitted for Dehydration and FTT.   Pt is pleasantly confused. Information obtained from spouse. He reports the pt has been eating porrly for approximately 1 month. She will not eat meals, rather graze throughout the day and take a few bites of certain items.   She did not take any Vitamins/Minerals at home, drink any type of nutritional beverage or follow any type of diet, however, the spouse notes she needs finger foods in order for her to feed herself.   Pt has not had any n/v until yesterday. Reportedly has severe constipation w/ last BM one week ago. This could be playing role in poor PO intake.    Chart review indicates an overall decline in functionality over the last few months. Etiology is currently being investigated, though appears to have been recently diagnosed with dementia. Remeron was recently started  Spouse reports UBW of 185 lbs and he says she weighed this 1 month ago. There is no objective information to confirm this.   At this time, she has eaten little of hospital meals; 0% Po intake of 1 documented  meal. Per husband/pt she likes saltier items. She said she would like a hotdog with fries. Given dietary restriction, just ordered hotdog. Hopefully, having an item she enjoys can promote intake.   Recommended starting nutritional supplements if the pt's meal intake does not improve. Spouse was agreeable to trying these.   NFPE: WDL  Labs: Albumin: 3.7 Medications: Dulcolax, aricept, Ensure Enlive, Remeron, PPI, miralax, senna   Recent Labs Lab 11/04/16 1342 11/06/16 1334 11/07/16 0544  NA 141 138 141  K 3.1* 2.9* 3.6  CL 97 98* 104  CO2 27 27 25   BUN 7* 7 6  CREATININE 0.78 0.84 0.82  CALCIUM 10.8* 12.4* 10.5*  GLUCOSE 149* 141* 116*   Diet Order:  Diet Heart Room service appropriate? Yes; Fluid consistency: Thin  Skin:  Reviewed, no issues  Last BM:  1/20  Height:  Ht Readings from Last 1 Encounters:  11/06/16 5' 7"  (1.702 m)   Weight:  Wt Readings from Last 1 Encounters:  11/06/16 170 lb (77.1 kg)   Wt Readings from Last 10 Encounters:  11/06/16 170 lb (77.1 kg)  11/04/16 170 lb (77.1 kg)  10/13/16 180 lb (81.6 kg)  06/05/16 180 lb 8 oz (81.9 kg)  03/12/16 177 lb 8 oz (80.5 kg)  12/31/15 179 lb (81.2 kg)  12/20/15 174 lb (78.9 kg)  06/08/13 189 lb 4.8 oz (85.9 kg)   Ideal Body Weight:  61.36 kg  BMI:  Body mass index is 26.63 kg/m.  Estimated Nutritional Needs:  Kcal:  1600-1850 kcals (21-24 kcal/kg bw) Protein:  68-80 g Pro (1.1-1.3 g/kg ibw) Fluid:  1.6-1.9 L fluid  EDUCATION NEEDS:  No education needs identified at this time  Burtis Junes RD, LDN, [place] [facility] Pager: [phone] 11/07/2016 11:12 AM

## 2016-11-07 NOTE — Evaluation (Signed)
Physical Therapy Evaluation Patient Details Name: Christine Webster MRN: [id number] DOB: January 08, 1941 Today's Date: 11/07/2016   History of Present Illness  Christine Webster is a 76 y.o. female with a history of dementia, diabetes mellitus diet controlled, hyperparathyroidism, hyperlipidemia. Patient has had diminished appetite over the past 3-4 weeks with acute onset of nausea and vomiting over the past 24-48 hours.  Clinical Impression  Patient presents with decreased mobility over past few weeks at home per spouse.  She currently needs +2 A for ambulation and max A for transfers.  Spouse reports falls at home due to getting up at night.  Feel continued skilled PT in acute setting and follow up HHPT to maximize safety and mobility with caregiver education and environmental modification.     Follow Up Recommendations Home health PT [facility] aide, [facility])    Equipment Recommendations  Wheelchair cushion (measurements PT);Wheelchair (measurements PT);3in1 (PT) (18x18)    Recommendations for Other Services       Precautions / Restrictions Precautions Precautions: Fall Precaution Comments: has fallen at home several times in past couple of weeks      Mobility  Bed Mobility Overal bed mobility: Needs Assistance Bed Mobility: Supine to Sit     Supine to sit: Mod assist     General bed mobility comments: guided legs to EOB and lifting trunk upright pt assisting  Transfers Overall transfer level: Needs assistance Equipment used: Rolling walker (2 wheeled);1 person hand held assist Transfers: Sit to/from [employer] Transfers Sit to Stand: Max assist Stand pivot transfers: Max assist       General transfer comment: lifting and lowering assist, pt at times initiates anterior weight shift better than others, such as if motivated to get up to chair; did not do any better with walker in front than not  Ambulation/Gait Ambulation/Gait assistance: +2 physical assistance;Max  assist Ambulation Distance (Feet): 5 Feet Assistive device: 2 person hand held assist Gait Pattern/deviations: Step-to pattern;Scissoring;Shuffle;Narrow base of support;Decreased stride length;Leaning posteriorly     General Gait Details: leans back into support, no automatic stepping response initiated with ant and lateral weight shift, seems anxious, poor awareness of upright with eyes shut tight most of session  Stairs            Wheelchair Mobility    Modified Rankin (Stroke Patients Only)       Balance Overall balance assessment: Needs assistance;History of Falls Sitting-balance support: Feet supported Sitting balance-Leahy Scale: Fair   Postural control: Posterior lean;Right lateral lean Standing balance support: Bilateral upper extremity supported Standing balance-Leahy Scale: Poor Standing balance comment: leans back to R into PT giving support                             Pertinent Vitals/Pain Pain Assessment: Faces Faces Pain Scale: No hurt    Home Living Family/patient expects to be discharged to:: Private residence Living Arrangements: Spouse/significant other Available Help at Discharge: Family;Available 24 hours/day Type of Home: House Home Access: Stairs to enter   [employer] of Steps: 1+ threshold, states having someone build a ramp Home Layout: One level Home Equipment: Environmental consultant - 2 wheels;Transport chair      Prior Function Level of Independence: Needs assistance   Gait / Transfers Assistance Needed: was walking up until about month or so ago. now assist with transfers to transport chair and then to toilet in bathroom  ADL's / Homemaking Assistance Needed: spouse recently just doing sponge baths  and had HH initiated just prior to admission        Hand Dominance        Extremity/Trunk Assessment   Upper Extremity Assessment Upper Extremity Assessment: Generalized weakness    Lower Extremity Assessment Lower  Extremity Assessment: Generalized weakness       Communication   Communication: Expressive difficulties  Cognition Arousal/Alertness: Awake/alert Behavior During Therapy: WFL for tasks assessed/performed Overall Cognitive Status: History of cognitive impairments - at baseline                 General Comments: kept eyes closed majority of session, able to follow one step commands with increased time and some multimodal cues,     General Comments General comments (skin integrity, edema, etc.): L forehead and eye bruising, L lateral arm bruising    Exercises     Assessment/Plan    PT Assessment Patient needs continued PT services  PT Problem List Decreased balance;Decreased mobility;Decreased safety awareness;Decreased coordination          PT Treatment Interventions Therapeutic activities;Therapeutic exercise;Balance training;Functional mobility training;Neuromuscular re-education    PT Goals (Current goals can be found in the Care Plan section)  Acute Rehab PT Goals Patient Stated Goal: To return home resume HH services PT Goal Formulation: With family Time For Goal Achievement: 11/14/16 Potential to Achieve Goals: Fair    Frequency Min 3X/week   Barriers to discharge        Co-evaluation               End of Session Equipment Utilized During Treatment: Gait belt Activity Tolerance: Patient tolerated treatment well Patient left: in chair;with call bell/phone within reach;with chair alarm set;with family/visitor present;Other (comment) (avasys telesitter in room)      Functional Assessment Tool Used: Clinical Judgement Functional Limitation: Mobility: Walking and moving around Mobility: Walking and Moving Around Current Status [phone]([id number]): At least 60 percent but less than 80 percent impaired, limited or restricted Mobility: Walking and Moving Around Goal Status [phone]): At least 40 percent but less than 60 percent impaired, limited or restricted     Time: 0910-0940 PT Time Calculation (min) (ACUTE ONLY): 30 min   Charges:   PT Evaluation $PT Eval Moderate Complexity: 1 Procedure PT Treatments $Therapeutic Activity: 8-22 mins   PT G Codes:   PT G-Codes **NOT FOR INPATIENT CLASS** Functional Assessment Tool Used: Clinical Judgement Functional Limitation: Mobility: Walking and moving around Mobility: Walking and Moving Around Current Status ([phone]): At least 60 percent but less than 80 percent impaired, limited or restricted Mobility: Walking and Moving Around Goal Status [phone]): At least 40 percent but less than 60 percent impaired, limited or restricted    Elray McgregorCynthia Arnetra Terris 11/07/2016, 10:54 AM  Sheran Lawlessyndi Daekwon Beswick, PT [phone] 11/07/2016

## 2016-11-07 NOTE — Care Management Note (Signed)
Case Management Note  Patient Details  Name: Christine Webster MRN: [id number] Date of Birth: Mar 03, 1941  Subjective/Objective: 76 y.o. F admitted with weakness with hx Dementia lives at home with spouse Christine Webster. Anticipate discharge to home with resumption of HHPT, and Aide provided by Christine Webster. Christine Webster, [facility] will deliver [facility]n1 and manual w/c to room today. No further CM needs at this time  In-House Referral:  NA  Discharge planning Services  CM Consult  Post Acute Webster Choice:  Durable Medical Equipment, Home Health, Resumption of Svcs/PTA Provider Choice offered to:  Spouse Christine Hua(Christine Webster at bedside) DME Arranged:  3-N-1, Government social research officerWheelchair manual DME Agency:  [facility]  Webster Arranged:  RN, PT, Nurse's Aide Webster Agency:  Christine [facility]  Status of Service:  Completed, signed off  If discussed at Long Length of Stay Meetings, dates discussed:    Additional Comments:  Christine Webster, Christine Bochicchio M, RN 11/07/2016, 10:29 AM

## 2016-11-07 NOTE — Progress Notes (Signed)
Flu PCR negative Droplet precautions discontinued. Masin Shatto Makika, RN 

## 2016-11-07 NOTE — Progress Notes (Signed)
Patient discharge teaching given, including activity, diet, follow-up appoints, and medications. Patient verbalized understanding of all discharge instructions. IV access was d/c'd. Vitals are stable. Skin is intact except as charted in most recent assessments. Pt to be escorted out by NT, to be driven home by family. 

## 2016-11-07 NOTE — Discharge Summary (Addendum)
[facility] Discharge Summary   Patient: Christine Webster JXB:[id number]   PCP: Gaye Alken, MD DOB: July 24, 1941   Date of admission: 11/06/2016   Date of discharge:  11/07/2016    Discharge Diagnoses:  Principal Problem:   Constipation Active Problems:   Memory disorder   Dehydration   Failure to thrive in adult   Hypokalemia   Diabetes mellitus without complication (HCC)   GERD (gastroesophageal reflux disease)   Hyperparathyroidism (HCC)   Vomiting   Admitted From: home Disposition:  Home with home health  Recommendations for Outpatient Follow-up:  1. Follow-up with PCP in one week to monitor improvement in oral intake as well as constipation  2. Also recommend to have a discussion regarding goals of care.  Follow-up Information    [facility] Follow up.   Specialty:  Home Health Services Why:  HHPT and Aide prior to admission resumed. Added HHRN at the recomendation of PT evaluation. Please contact agency at the above number or the agency number you have from before admission.  Contact information: [address] [place] [address] [phone]        [facility] Follow up.   Why:  A Manual W/C with Cushion and  Elevated Leg Rests and 3n1 will be delivered to your room prior to discharge.  Contact information: [address] [place] [address] [phone]        Gaye Alken, MD. Schedule an appointment as soon as possible for a visit in 1 week(s).   Specialty:  Family Medicine Contact information: [address] [place] [address] [phone]          Diet recommendation: Regular diet  Activity: The patient is advised to gradually reintroduce usual activities.  Discharge Condition: good  Code Status: Full code  History of present illness: As per the H and P dictated on admission, "Christine Webster is a 76 y.o. female with a history of dementia,  diabetes mellitus diet controlled, hyperparathyroidism, hyperlipidemia. Patient has had diminished appetite over the past 3-4 weeks with acute onset of nausea and vomiting over the past 24-48 hours. Patient has had several episodes of emesis that is described as stomach contents. No palliating or provoking factors. She denies abdominal pain, dysuria. Her oral intake has been limited to a few bites of yogurt or other soft foods with minimal oral intake. Denies fevers, chills, sick contacts.  She was seen by Dr. Anne Hahn from urology on the 24th, who ordered thyroid studies, ammonia level, HIV, ANA, and a few antibodies to look for other reasons of patient's weakness. These all returned normal."  Hospital Course:   Summary of her active problems in the hospital is as following.  Principal Problem: Constipation, nausea and vomiting, poor oral intake. Dehydration. Hypokalemia. Failure to thrive in adult. Patient presented with complaints of decreased appetite and nausea and vomiting. This lead to dehydration and hypokalemia. Patient was given IV fluids and potassium with improvement in dehydration as well as hypokalemia. Nausea and vomiting was secondary to constipation. Patient will be prescribed oral MiraLAX and other stool softener. Patient will be prescribed Remeron. Family described to the patient has not been sleeping comfortably for last few days as well, Premarin should help in that as well.  Encephalopathy. Chronic in nature and progressively worsening. Patient has been recently seen by neurology was recommended MRI of the brain. Continue further workup as an outpatient.  Recurrent fall. PTOT will return to the  patient. Home health arranged.  CT head C-spine unremarkable.  All other chronic medical condition were stable during the hospitalization.  Patient was seen by physical therapy, who recommended home health, which was arranged by Child psychotherapist and case Production designer, theatre/television/film. On the day of  the discharge the patient's vitals were stable , and no other acute medical condition were reported by patient. the patient was felt safe to be discharge at home with home health.  Procedures and Results:  noen   Consultations:  none  DISCHARGE MEDICATION: Current Discharge Medication List    START taking these medications   Details  docusate sodium (COLACE) 100 MG capsule Take 1 capsule (100 mg total) by mouth 2 (two) times daily. Qty: 10 capsule, Refills: 0    feeding supplement, ENSURE ENLIVE, (ENSURE ENLIVE) LIQD Take 237 mLs by mouth 2 (two) times daily between meals. Qty: 237 mL, Refills: 12    mirtazapine (REMERON) 15 MG tablet Take 1 tablet (15 mg total) by mouth at bedtime. Qty: 30 tablet, Refills: 0    ondansetron (ZOFRAN ODT) 4 MG disintegrating tablet Take 1 tablet (4 mg total) by mouth every 8 (eight) hours as needed for nausea or vomiting. Qty: 20 tablet, Refills: 0    polyethylene glycol (MIRALAX / GLYCOLAX) packet Take 17 g by mouth daily. Qty: 14 each, Refills: 0    potassium chloride (K-DUR) 10 MEQ tablet Take 1 tablet (10 mEq total) by mouth daily. Qty: 7 tablet, Refills: 0    senna (SENOKOT) 8.6 MG TABS tablet Take 1 tablet (8.6 mg total) by mouth at bedtime. Qty: 120 each, Refills: 0    sodium phosphate (FLEET) 7-19 GM/118ML ENEM Place 133 mLs (1 enema total) rectally daily as needed for severe constipation. Qty: 5 enema, Refills: 0      CONTINUE these medications which have NOT CHANGED   Details  aspirin EC 325 MG tablet Take 325 mg by mouth every evening.    atorvastatin (LIPITOR) 10 MG tablet Take 10 mg by mouth every evening.     buPROPion (WELLBUTRIN XL) 150 MG 24 hr tablet Take 150 mg by mouth daily.  Refills: 0    cholecalciferol (VITAMIN D) 1000 units tablet Take 1,000 Units by mouth daily.    donepezil (ARICEPT) 10 MG tablet Take 1 tablet (10 mg total) by mouth at bedtime. Qty: 90 tablet, Refills: 3    esomeprazole (NEXIUM) 40 MG  capsule Take 40 mg by mouth 2 (two) times daily before a meal.     FLUoxetine (PROZAC) 20 MG capsule Take 40 mg by mouth daily.        No Known Allergies Discharge Instructions    Diet - low sodium heart healthy    Complete by:  As directed    Discharge instructions    Complete by:  As directed    It is important that you read following instructions as well as go over your medication list with RN to help you understand your care after this hospitalization.  Discharge Instructions: Please follow-up with PCP in one week  Please request your primary care physician to go over all Hospital Tests and Procedure/Radiological results at the follow up,  Please get all Hospital records sent to your PCP by signing hospital release before you go home.   Do not take more than prescribed Pain, Sleep and Anxiety Medications. You were cared for by a hospitalist during your hospital stay. If you have any questions about your discharge medications or the care you received  while you were in the hospital after you are discharged, you can call the unit and ask to speak with the hospitalist on call if the hospitalist that took care of you is not available.  Once you are discharged, your primary care physician will handle any further medical issues. Please note that NO REFILLS for any discharge medications will be authorized once you are discharged, as it is imperative that you return to your primary care physician (or establish a relationship with a primary care physician if you do not have one) for your aftercare needs so that they can reassess your need for medications and monitor your lab values. You Must read complete instructions/literature along with all the possible adverse reactions/side effects for all the Medicines you take and that have been prescribed to you. Take any new Medicines after you have completely understood and accept all the possible adverse reactions/side effects. Wear Seat belts while  driving.   Increase activity slowly    Complete by:  As directed      Discharge Exam: Filed Weights   11/06/16 1109  Weight: 77.1 kg (170 lb)   Vitals:   11/06/16 2113 11/07/16 0540  BP: (!) 164/72 (!) 162/91  Pulse: 67 63  Resp: 17 19  Temp: 98.4 F (36.9 C) 98.4 F (36.9 C)   General: Appear in mild distress, left peri-orbital discoloration; Oral Mucosa moist. Cardiovascular: S1 and S2 Present, no Murmur, no JVD Respiratory: Bilateral Air entry present and Clear to Auscultation, no Crackles, no wheezes Abdomen: Bowel Sound present, Soft and no tenderness Extremities: no Pedal edema, no calf tenderness Neurology: Grossly no focal neuro deficit.   The results of significant diagnostics from this hospitalization (including imaging, microbiology, ancillary and laboratory) are listed below for reference.    Significant Diagnostic Studies: Dg Chest 2 View  Result Date: 11/06/2016 CLINICAL DATA:  Fall EXAM: CHEST  2 VIEW COMPARISON:  10/13/2016 FINDINGS: Lingular scarring or atelectasis. Mild cardiomegaly. Low lung volumes. No focal opacity on the right. No effusions or acute bony abnormality. IMPRESSION: Lingular scarring or atelectasis.  Low lung volumes. Electronically Signed   By: Charlett Nose M.D.   On: 11/06/2016 14:10   Ct Head Wo Contrast  Result Date: 11/06/2016 CLINICAL DATA:  Forehead and facial bruising after falling last week. Increasing confusion for 1 month. Vomiting. EXAM: CT HEAD WITHOUT CONTRAST CT CERVICAL SPINE WITHOUT CONTRAST TECHNIQUE: Multidetector CT imaging of the head and cervical spine was performed following the standard protocol without intravenous contrast. Multiplanar CT image reconstructions of the cervical spine were also generated. COMPARISON:  CT head 10/13/2016.  Chest CT 12/20/2015. FINDINGS: CT HEAD FINDINGS Despite efforts by the technologist and patient, motion artifact is present on today's exam and could not be eliminated. This reduces exam  sensitivity and specificity. Repeat images of the head are of better quality. Brain: There is no evidence of acute intracranial hemorrhage, mass lesion, brain edema or extra-axial fluid collection. There is advanced atrophy with diffuse prominence of the ventricles and subarachnoid spaces. Chronic low-density in the periventricular white matter appears stable, likely due to chronic small vessel ischemic changes. There is no CT evidence of acute cortical infarction. Vascular: No hyperdense vessel demonstrated. Intracranial vascular calcifications are noted. Skull: No evidence of acute calvarial fracture. There is left frontal scalp swelling. 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 Sinuses/Orbits: The visualized paranasal sinuses and mastoid air cells are clear. No orbital abnormalities are seen. Other: None. CT CERVICAL SPINE FINDINGS Alignment: Normal. Skull base  and vertebrae: No evidence of acute fracture or traumatic subluxation. Soft tissues and spinal canal: No soft tissue swelling or visible canal hematoma. Carotid atherosclerosis present bilaterally. Disc levels: There is relatively mild multilevel disc space narrowing and uncinate spurring, greatest at C5-6 and C6-7. No large disc herniation or significant spinal stenosis seen. Upper chest: Appears stable. The right thyroid lobe is replaced by a 2.6 cm nodule. There is a smaller left thyroid nodule. Other: None. IMPRESSION: 1. Focal right frontal scalp soft tissue injury. No evidence of calvarial fracture or acute intracranial process. 2. Stable atrophy and chronic small vessel ischemic changes. 3. No evidence of acute cervical spine fracture, traumatic subluxation or static signs of instability. Electronically Signed   By: Carey BullocksWilliam  Veazey M.D.   On: 11/06/2016 14:48   Ct Head Wo Contrast  Result Date: 10/13/2016 CLINICAL DATA:  Decreased appetite, recent fall EXAM: CT HEAD WITHOUT CONTRAST TECHNIQUE: Contiguous axial images were obtained from the base of  the skull through the vertex without intravenous contrast. COMPARISON:  12/20/2015 FINDINGS: Brain: No evidence of acute infarction, hemorrhage, extra-axial collection, ventriculomegaly, or mass effect. Generalized cerebral atrophy. Periventricular white matter low attenuation likely secondary to microangiopathy. Vascular: Cerebrovascular atherosclerotic calcifications are noted. Skull: Negative for fracture or focal lesion. Sinuses/Orbits: Visualized portions of the orbits are unremarkable. Visualized portions of the paranasal sinuses and mastoid air cells are unremarkable. Other: None. IMPRESSION: 1. No acute intracranial pathology. 2. Chronic microvascular disease and cerebral atrophy. Electronically Signed   By: Elige KoHetal  Jaidynn Balster   On: 10/13/2016 10:11   Ct Cervical Spine Wo Contrast  Result Date: 11/06/2016 CLINICAL DATA:  Forehead and facial bruising after falling last week. Increasing confusion for 1 month. Vomiting. EXAM: CT HEAD WITHOUT CONTRAST CT CERVICAL SPINE WITHOUT CONTRAST TECHNIQUE: Multidetector CT imaging of the head and cervical spine was performed following the standard protocol without intravenous contrast. Multiplanar CT image reconstructions of the cervical spine were also generated. COMPARISON:  CT head 10/13/2016.  Chest CT 12/20/2015. FINDINGS: CT HEAD FINDINGS Despite efforts by the technologist and patient, motion artifact is present on today's exam and could not be eliminated. This reduces exam sensitivity and specificity. Repeat images of the head are of better quality. Brain: There is no evidence of acute intracranial hemorrhage, mass lesion, brain edema or extra-axial fluid collection. There is advanced atrophy with diffuse prominence of the ventricles and subarachnoid spaces. Chronic low-density in the periventricular white matter appears stable, likely due to chronic small vessel ischemic changes. There is no CT evidence of acute cortical infarction. Vascular: No hyperdense vessel  demonstrated. Intracranial vascular calcifications are noted. Skull: No evidence of acute calvarial fracture. There is left frontal scalp swelling. 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 Sinuses/Orbits: The visualized paranasal sinuses and mastoid air cells are clear. No orbital abnormalities are seen. Other: None. CT CERVICAL SPINE FINDINGS Alignment: Normal. Skull base and vertebrae: No evidence of acute fracture or traumatic subluxation. Soft tissues and spinal canal: No soft tissue swelling or visible canal hematoma. Carotid atherosclerosis present bilaterally. Disc levels: There is relatively mild multilevel disc space narrowing and uncinate spurring, greatest at C5-6 and C6-7. No large disc herniation or significant spinal stenosis seen. Upper chest: Appears stable. The right thyroid lobe is replaced by a 2.6 cm nodule. There is a smaller left thyroid nodule. Other: None. IMPRESSION: 1. Focal right frontal scalp soft tissue injury. No evidence of calvarial fracture or acute intracranial process. 2. Stable atrophy and chronic small vessel ischemic changes. 3. No  evidence of acute cervical spine fracture, traumatic subluxation or static signs of instability. Electronically Signed   By: Carey Bullocks M.D.   On: 11/06/2016 14:48   Dg Abd Acute W/chest  Result Date: 10/13/2016 CLINICAL DATA:  Pt's husband reports pt fell 2 days ago in bathroom, hitting face; reports decreased activity/appetite since then, hx of diabetes, GERD, HTN, hyperlipidemia, intestinal malrotation repair, no other complaints EXAM: DG ABDOMEN ACUTE W/ 1V CHEST COMPARISON:  12/20/2015 and 09/29/2011. FINDINGS: Normal bowel gas pattern.  No free air. No evidence of renal or ureteral stones. Soft tissues are unremarkable. Cardiac silhouette is mildly enlarged. There is a small moderate hiatal hernia. Stable scarring is noted in the left mid lung. Lungs otherwise clear. Skeletal structures are demineralized but grossly intact. IMPRESSION: 1.  No acute findings.  No evidence of bowel obstruction or free air. 2. No acute cardiopulmonary disease. Electronically Signed   By: Amie Portland M.D.   On: 10/13/2016 10:19   Dg Abd Portable 1v  Result Date: 11/07/2016 CLINICAL DATA:  Nausea, vomiting EXAM: PORTABLE ABDOMEN - 1 VIEW COMPARISON:  10/13/2016 FINDINGS: The bowel gas pattern is normal. No radio-opaque calculi or other significant radiographic abnormality are seen. IMPRESSION: Negative. Electronically Signed   By: Charlett Nose M.D.   On: 11/07/2016 09:27    Microbiology: Recent Results (from the past 240 hour(s))  Urine culture     Status: None   Collection Time: 11/06/16  3:03 PM  Result Value Ref Range Status   Specimen Description URINE, CATHETERIZED  Final   Special Requests NONE  Final   Culture NO GROWTH  Final   Report Status 11/07/2016 FINAL  Final     Labs: CBC:  Recent Labs Lab 11/06/16 1334 11/07/16 0544  WBC 8.1 7.2  NEUTROABS 5.9  --   HGB 15.2* 12.5  HCT 44.3 38.6  MCV 82.2 83.5  PLT 237 233   Basic Metabolic Panel:  Recent Labs Lab 11/04/16 1342 11/06/16 1334 11/07/16 0544  NA 141 138 141  K 3.1* 2.9* 3.6  CL 97 98* 104  CO2 27 27 25   GLUCOSE 149* 141* 116*  BUN 7* 7 6  CREATININE 0.78 0.84 0.82  CALCIUM 10.8* 12.4* 10.5*   Liver Function Tests:  Recent Labs Lab 11/04/16 1342 11/06/16 1334  AST 15 22  ALT 14 17  ALKPHOS 103 82  BILITOT 1.0 1.4*  PROT 6.9 6.5  ALBUMIN 4.3 3.7    Recent Labs Lab 11/06/16 1334  LIPASE 19    Recent Labs Lab 11/04/16 1342  AMMONIA 73   Time spent: 30 minutes  Signed:  Courtenay Hirth  [facility]  11/07/2016  , 2:33 PM

## 2016-11-08 ENCOUNTER — Emergency Department (HOSPITAL_COMMUNITY): Payer: Medicare Other

## 2016-11-08 ENCOUNTER — Observation Stay (HOSPITAL_COMMUNITY)
Admission: EM | Admit: 2016-11-08 | Discharge: 2016-11-11 | Disposition: A | Discharge status: Home / Self Care | Payer: Medicare Other | Attending: Internal Medicine | Admitting: Internal Medicine

## 2016-11-08 ENCOUNTER — Encounter (HOSPITAL_COMMUNITY): Payer: Self-pay | Admitting: Emergency Medicine

## 2016-11-08 DIAGNOSIS — N390 Urinary tract infection, site not specified: Principal | ICD-10-CM | POA: Insufficient documentation

## 2016-11-08 DIAGNOSIS — G9340 Encephalopathy, unspecified: Secondary | ICD-10-CM | POA: Insufficient documentation

## 2016-11-08 DIAGNOSIS — E119 Type 2 diabetes mellitus without complications: Secondary | ICD-10-CM | POA: Diagnosis not present

## 2016-11-08 DIAGNOSIS — R627 Adult failure to thrive: Secondary | ICD-10-CM | POA: Diagnosis present

## 2016-11-08 DIAGNOSIS — G9349 Other encephalopathy: Secondary | ICD-10-CM

## 2016-11-08 DIAGNOSIS — Z7982 Long term (current) use of aspirin: Secondary | ICD-10-CM | POA: Diagnosis not present

## 2016-11-08 DIAGNOSIS — F0390 Unspecified dementia without behavioral disturbance: Secondary | ICD-10-CM | POA: Insufficient documentation

## 2016-11-08 DIAGNOSIS — E876 Hypokalemia: Secondary | ICD-10-CM | POA: Diagnosis present

## 2016-11-08 DIAGNOSIS — Z79899 Other long term (current) drug therapy: Secondary | ICD-10-CM | POA: Insufficient documentation

## 2016-11-08 DIAGNOSIS — I10 Essential (primary) hypertension: Secondary | ICD-10-CM | POA: Insufficient documentation

## 2016-11-08 DIAGNOSIS — E213 Hyperparathyroidism, unspecified: Secondary | ICD-10-CM | POA: Diagnosis present

## 2016-11-08 DIAGNOSIS — R531 Weakness: Secondary | ICD-10-CM | POA: Diagnosis not present

## 2016-11-08 DIAGNOSIS — K219 Gastro-esophageal reflux disease without esophagitis: Secondary | ICD-10-CM | POA: Diagnosis present

## 2016-11-08 LAB — COMPREHENSIVE METABOLIC PANEL
ALK PHOS: 77 U/L (ref 38–126)
ALT: 15 U/L (ref 14–54)
AST: 23 U/L (ref 15–41)
Albumin: 3.6 g/dL (ref 3.5–5.0)
Anion gap: 6 (ref 5–15)
BUN: 7 mg/dL (ref 6–20)
CALCIUM: 9.8 mg/dL (ref 8.9–10.3)
CO2: 29 mmol/L (ref 22–32)
CREATININE: 0.79 mg/dL (ref 0.44–1.00)
Chloride: 100 mmol/L — ABNORMAL LOW (ref 101–111)
GFR calc non Af Amer: >60 mL/min (ref >60)
GLUCOSE: 95 mg/dL (ref 65–99)
Potassium: 3.4 mmol/L — ABNORMAL LOW (ref 3.5–5.1)
SODIUM: 135 mmol/L (ref 135–145)
Total Bilirubin: 1 mg/dL (ref 0.3–1.2)
Total Protein: 6.2 g/dL — ABNORMAL LOW (ref 6.5–8.1)

## 2016-11-08 LAB — CBC WITH DIFFERENTIAL/PLATELET
Basophils Absolute: 0 10*3/uL (ref 0.0–0.1)
Basophils Relative: 0 %
Eosinophils Absolute: 0.1 10*3/uL (ref 0.0–0.7)
Eosinophils Relative: 1 %
HEMATOCRIT: 36.3 % (ref 36.0–46.0)
Hemoglobin: 12 g/dL (ref 12.0–15.0)
LYMPHS PCT: 23 %
Lymphs Abs: 2.1 10*3/uL (ref 0.7–4.0)
MCH: 27.3 pg (ref 26.0–34.0)
MCHC: 33.1 g/dL (ref 30.0–36.0)
MCV: 82.5 fL (ref 78.0–100.0)
MONO ABS: 0.6 10*3/uL (ref 0.1–1.0)
MONOS PCT: 7 %
NEUTROS ABS: 6.1 10*3/uL (ref 1.7–7.7)
Neutrophils Relative %: 69 %
Platelets: 227 10*3/uL (ref 150–400)
RBC: 4.4 MIL/uL (ref 3.87–5.11)
RDW: 14.1 % (ref 11.5–15.5)
WBC: 8.9 10*3/uL (ref 4.0–10.5)

## 2016-11-08 LAB — TROPONIN I: Troponin I: <0.03 ng/mL (ref <0.03)

## 2016-11-08 LAB — LIPASE, BLOOD: LIPASE: 18 U/L (ref 11–51)

## 2016-11-08 LAB — CALCIUM, IONIZED: Calcium, Ionized, Serum: 6.2 mg/dL — ABNORMAL HIGH (ref 4.5–5.6)

## 2016-11-08 LAB — ETHANOL: Alcohol, Ethyl (B): <5 mg/dL (ref <5)

## 2016-11-08 NOTE — ED Provider Notes (Signed)
[facility]-EMERGENCY DEPT Provider Note   CSN: [id number] Arrival date & time: 11/08/16  1836     History   Chief Complaint Chief Complaint  Patient presents with  . Failure To Thrive    HPI Christine Webster is a 76 y.o. female.  HPI  Elderly female with dementia, level V caveat Patient is here with her husband, and a clergy member. He notes that since discharge yesterday, from our affiliated facility the patient has had progressive weakness, lack of interaction, anorexia, has had one episode of sliding from her bed, with no ability of family members to return her to her bed, nor any return to complete functionality. On discharge the patient was provided for home health services, but these have not yet begun.   Past Medical History:  Diagnosis Date  . Abnormality of gait 12/31/2015  . Colon polyps   . Dementia   . Depression   . Diabetes mellitus without complication (HCC)   . Genital herpes   . GERD (gastroesophageal reflux disease)   . Hyperlipidemia   . Hyperparathyroidism (HCC)    Dr. Talmage NapBalan  . Hypertension   . Kidney stones   . Memory disorder 12/31/2015  . Obesity   . Vitamin D deficiency     Patient Active Problem List   Diagnosis Date Noted  . Constipation 11/07/2016  . Dehydration 11/06/2016  . Failure to thrive in adult 11/06/2016  . Hypokalemia 11/06/2016  . Vomiting 11/06/2016  . Hypertension   . Diabetes mellitus without complication (HCC)   . GERD (gastroesophageal reflux disease)   . Obesity   . Hyperparathyroidism (HCC)   . Transient alteration of awareness 12/31/2015  . Memory disorder 12/31/2015  . Abnormality of gait 12/31/2015    Past Surgical History:  Procedure Laterality Date  . INTESTINAL MALROTATION REPAIR    . NISSEN FUNDOPLICATION     for huge hiatal hernia 07/2008  . thyroid tumor     benign    OB History    No data available       Home Medications    Prior to Admission medications   Medication Sig Start Date End  Date Taking? Authorizing Provider  aspirin EC 325 MG tablet Take 325 mg by mouth every evening.   Yes Historical Provider, MD  atorvastatin (LIPITOR) 10 MG tablet Take 10 mg by mouth every evening.    Yes Historical Provider, MD  buPROPion (WELLBUTRIN XL) 150 MG 24 hr tablet Take 150 mg by mouth daily.    Yes Historical Provider, MD  cholecalciferol (VITAMIN D) 1000 units tablet Take 1,000 Units by mouth daily.   Yes Historical Provider, MD  docusate sodium (COLACE) 100 MG capsule Take 1 capsule (100 mg total) by mouth 2 (two) times daily. 11/07/16  Yes Rolly SalterPranav M Patel, MD  donepezil (ARICEPT) 10 MG tablet Take 1 tablet (10 mg total) by mouth at bedtime. 09/14/16  Yes York Spanielharles K Willis, MD  esomeprazole (NEXIUM) 40 MG capsule Take 40 mg by mouth 2 (two) times daily before a meal.    Yes Historical Provider, MD  FLUoxetine (PROZAC) 20 MG capsule Take 40 mg by mouth daily.    Yes Historical Provider, MD  feeding supplement, ENSURE ENLIVE, (ENSURE ENLIVE) LIQD Take 237 mLs by mouth 2 (two) times daily between meals. 11/07/16   Rolly SalterPranav M Patel, MD  mirtazapine (REMERON) 15 MG tablet Take 1 tablet (15 mg total) by mouth at bedtime. 11/07/16   Rolly SalterPranav M Patel, MD  ondansetron (ZOFRAN ODT) 4  MG disintegrating tablet Take 1 tablet (4 mg total) by mouth every 8 (eight) hours as needed for nausea or vomiting. 11/07/16   Rolly Salter, MD  polyethylene glycol (MIRALAX / Ethelene Hal) packet Take 17 g by mouth daily. 11/07/16   Rolly Salter, MD  potassium chloride (K-DUR) 10 MEQ tablet Take 1 tablet (10 mEq total) by mouth daily. 11/07/16   Rolly Salter, MD  senna (SENOKOT) 8.6 MG TABS tablet Take 1 tablet (8.6 mg total) by mouth at bedtime. 11/07/16   Rolly Salter, MD  sodium phosphate (FLEET) 7-19 GM/118ML ENEM Place 133 mLs (1 enema total) rectally daily as needed for severe constipation. 11/07/16   Rolly Salter, MD    Family History Family History  Problem Relation Age of Onset  . Sleep apnea Father   .  Alcohol abuse Father   . Stroke Father   . Dementia Mother   . Parkinsonism Sister     Social History Social History  Substance Use Topics  . Smoking status: Never Smoker  . Smokeless tobacco: Never Used  . Alcohol use No     Allergies   Patient has no known allergies.   Review of Systems Review of Systems  Unable to perform ROS: Dementia     Physical Exam Updated Vital Signs BP (!) 149/102   Pulse 72   Temp 99.4 F (37.4 C) (Oral)   Resp 18   Ht 5\' 7"  (1.702 m)   Wt 165 lb (74.8 kg)   SpO2 97%   BMI 25.84 kg/m   Physical Exam  Constitutional: She appears listless. She appears ill. No distress.  Frail, ill-appearing elderly female, essentially noninteractive  HENT:  Head: Normocephalic and atraumatic.  Eyes: Conjunctivae and EOM are normal.  Cardiovascular: Normal rate and regular rhythm.   Pulmonary/Chest: Effort normal and breath sounds normal. No stridor. No respiratory distress.  Abdominal: She exhibits no distension.  Musculoskeletal: She exhibits no edema.  Neurological: She appears listless. She displays atrophy. No cranial nerve deficit.  Patient does not follow commands reliably, is disoriented, does not seem to pay attention to anything.  Skin: Skin is warm and dry.  Psychiatric: She is withdrawn. Cognition and memory are impaired. She is noncommunicative.  Nursing note and vitals reviewed.    ED Treatments / Results  Labs (all labs ordered are listed, but only abnormal results are displayed) Labs Reviewed  CBC WITH DIFFERENTIAL/PLATELET  COMPREHENSIVE METABOLIC PANEL  ETHANOL  LIPASE, BLOOD  TROPONIN I  URINALYSIS, ROUTINE W REFLEX MICROSCOPIC     Radiology Dg Chest 2 View  Result Date: 11/08/2016 CLINICAL DATA:  Recent falls.  Failure to thrive. EXAM: CHEST  2 VIEW COMPARISON:  11/06/2016 FINDINGS: Shallow lung volumes. No confluent consolidation. Curvilinear opacities bilaterally may represent scarring or atelectasis. No pleural  effusions. Hilar and mediastinal contours are unremarkable and unchanged. IMPRESSION: Low lung volumes. Linear scarring or atelectasis, without confluent consolidation. No effusions. Electronically Signed   By: Ellery Plunk M.D.   On: 11/08/2016 21:19   Dg Abd Portable 1v  Result Date: 11/07/2016 CLINICAL DATA:  Nausea, vomiting EXAM: PORTABLE ABDOMEN - 1 VIEW COMPARISON:  10/13/2016 FINDINGS: The bowel gas pattern is normal. No radio-opaque calculi or other significant radiographic abnormality are seen. IMPRESSION: Negative. Electronically Signed   By: Charlett Nose M.D.   On: 11/07/2016 09:27    Procedures Procedures (including critical care time)    Initial Impression / Assessment and Plan / ED Course  I have  reviewed the triage vital signs and the nursing notes.  Pertinent labs & imaging results that were available during my care of the patient were reviewed by me and considered in my medical decision making (see chart for details).   Elderly female with dementia presents one day after discharge from another healthcare facility, and since returning home has been essentially noninteractive, nonambulatory, anorexic, and is concern for progression of disease. No additional new acute findings identified tonight. Given the patient's family's inability to care for the patient, she will be observed overnight pending morning social worker consult.  UA not yet available.    Gerhard Munch, MD 11/08/16 2329

## 2016-11-08 NOTE — ED Notes (Signed)
Patient attempting to get out of bed. Patient placed on stretcher bed alarm.

## 2016-11-08 NOTE — ED Notes (Signed)
No respiratory or acute distress noted resting in bed with eyes closed call light in reach bed alarm on and functioning. 

## 2016-11-08 NOTE — ED Notes (Signed)
Bed: WA02 Expected date:  Expected time:  Means of arrival:  Comments: 76 yo Failure to Thrive

## 2016-11-08 NOTE — ED Notes (Signed)
No respiratory or acute distress noted family at bedside call light in reach bed alarm on bed and functioning resting in bed with eyes closed.

## 2016-11-08 NOTE — ED Notes (Signed)
Pt trying to get out of bed bed alarm on bed and functioning and moved pt to room 4 to be closer to nursing desk husband in room trying to keep pt in bed.

## 2016-11-08 NOTE — ED Notes (Signed)
Pt unable to follow comands about staying in bed and calling for help. I tried to explain to the patient multiple times about the importance of not getting up without help. All pt would respond is "what do you want." I placed pt on bed alarm, put on yellow socks,and put fall risk band on pt. Pt also repositioned in bed for comfort. Pt placed on cardiac monitor for extra monitoring. Pt with history of multiple recent falls. Pt also keeps taking BP cuff and O2 sensor off.

## 2016-11-08 NOTE — ED Triage Notes (Addendum)
Per EMS, slipped and lowered to ground from wheelchair today; patient has no complaints from today's fall. Patient had another fall on Friday (bruising noted to face from this fall). Per husband, patient has not been eating/drinking/ completing ADLs on her own for the past month. Husband is worried he cannot take care of her by himself anymore. Husband wants patient to be evaluated and seeking possible placement. Hx of dementia. Alert and oriented per norm. Patient is from home.

## 2016-11-09 ENCOUNTER — Observation Stay (HOSPITAL_COMMUNITY): Payer: Medicare Other

## 2016-11-09 DIAGNOSIS — N390 Urinary tract infection, site not specified: Secondary | ICD-10-CM | POA: Diagnosis present

## 2016-11-09 DIAGNOSIS — F0281 Dementia in other diseases classified elsewhere with behavioral disturbance: Secondary | ICD-10-CM

## 2016-11-09 DIAGNOSIS — G308 Other Alzheimer's disease: Secondary | ICD-10-CM

## 2016-11-09 DIAGNOSIS — R627 Adult failure to thrive: Secondary | ICD-10-CM

## 2016-11-09 DIAGNOSIS — E119 Type 2 diabetes mellitus without complications: Secondary | ICD-10-CM | POA: Diagnosis not present

## 2016-11-09 DIAGNOSIS — F0390 Unspecified dementia without behavioral disturbance: Secondary | ICD-10-CM | POA: Diagnosis present

## 2016-11-09 LAB — URINALYSIS, ROUTINE W REFLEX MICROSCOPIC
BILIRUBIN URINE: NEGATIVE
Glucose, UA: NEGATIVE mg/dL
KETONES UR: NEGATIVE mg/dL
NITRITE: POSITIVE — AB
PROTEIN: NEGATIVE mg/dL
SQUAMOUS EPITHELIAL / LPF: NONE SEEN
Specific Gravity, Urine: 1.009 (ref 1.005–1.030)
pH: 8 (ref 5.0–8.0)

## 2016-11-09 MED ORDER — ATORVASTATIN CALCIUM 10 MG PO TABS
10.0000 mg | ORAL_TABLET | Freq: Every evening | ORAL | Status: DC
  Administered 2016-11-09: 10 mg via ORAL
  Filled 2016-11-09: qty 1

## 2016-11-09 MED ORDER — BUPROPION HCL ER (XL) 150 MG PO TB24
150.0000 mg | ORAL_TABLET | Freq: Every day | ORAL | Status: DC
  Administered 2016-11-09 – 2016-11-11 (×3): 150 mg via ORAL
  Filled 2016-11-09 (×3): qty 1

## 2016-11-09 MED ORDER — POLYETHYLENE GLYCOL 3350 17 G PO PACK
17.0000 g | PACK | Freq: Every day | ORAL | Status: DC
  Administered 2016-11-11: 17 g via ORAL
  Filled 2016-11-09 (×2): qty 1

## 2016-11-09 MED ORDER — DOCUSATE SODIUM 100 MG PO CAPS
100.0000 mg | ORAL_CAPSULE | Freq: Two times a day (BID) | ORAL | Status: DC
  Administered 2016-11-09 – 2016-11-11 (×5): 100 mg via ORAL
  Filled 2016-11-09 (×5): qty 1

## 2016-11-09 MED ORDER — FLUOXETINE HCL 20 MG PO CAPS
40.0000 mg | ORAL_CAPSULE | Freq: Every day | ORAL | Status: DC
  Administered 2016-11-09 – 2016-11-11 (×3): 40 mg via ORAL
  Filled 2016-11-09 (×3): qty 2

## 2016-11-09 MED ORDER — MIRTAZAPINE 15 MG PO TABS
15.0000 mg | ORAL_TABLET | Freq: Every day | ORAL | Status: DC
  Administered 2016-11-09 – 2016-11-10 (×2): 15 mg via ORAL
  Filled 2016-11-09 (×2): qty 1

## 2016-11-09 MED ORDER — PANTOPRAZOLE SODIUM 40 MG PO TBEC
40.0000 mg | DELAYED_RELEASE_TABLET | Freq: Every day | ORAL | Status: DC
  Administered 2016-11-10 – 2016-11-11 (×2): 40 mg via ORAL
  Filled 2016-11-09 (×3): qty 1

## 2016-11-09 MED ORDER — DONEPEZIL HCL 10 MG PO TABS
10.0000 mg | ORAL_TABLET | Freq: Every day | ORAL | Status: DC
  Administered 2016-11-09 – 2016-11-10 (×2): 10 mg via ORAL
  Filled 2016-11-09 (×3): qty 1

## 2016-11-09 MED ORDER — KCL IN DEXTROSE-NACL 10-5-0.45 MEQ/L-%-% IV SOLN
INTRAVENOUS | Status: AC
  Administered 2016-11-09: 1000 mL via INTRAVENOUS
  Administered 2016-11-10: 10:00:00 via INTRAVENOUS
  Filled 2016-11-09 (×2): qty 1000

## 2016-11-09 MED ORDER — LORAZEPAM 2 MG/ML IJ SOLN
1.0000 mg | Freq: Once | INTRAMUSCULAR | Status: AC | PRN
  Administered 2016-11-09: 1 mg via INTRAVENOUS
  Filled 2016-11-09: qty 1

## 2016-11-09 MED ORDER — ONDANSETRON 4 MG PO TBDP
4.0000 mg | ORAL_TABLET | Freq: Three times a day (TID) | ORAL | Status: DC | PRN
  Administered 2016-11-10 – 2016-11-11 (×2): 4 mg via ORAL
  Filled 2016-11-09 (×2): qty 1

## 2016-11-09 MED ORDER — ASPIRIN EC 325 MG PO TBEC
325.0000 mg | DELAYED_RELEASE_TABLET | Freq: Every evening | ORAL | Status: DC
  Administered 2016-11-09: 325 mg via ORAL
  Filled 2016-11-09: qty 1

## 2016-11-09 MED ORDER — DEXTROSE 5 % IV SOLN
1.0000 g | INTRAVENOUS | Status: DC
  Administered 2016-11-10: 1 g via INTRAVENOUS
  Filled 2016-11-09: qty 10

## 2016-11-09 MED ORDER — DEXTROSE 5 % IV SOLN
1.0000 g | Freq: Once | INTRAVENOUS | Status: AC
  Administered 2016-11-09: 1 g via INTRAVENOUS
  Filled 2016-11-09: qty 10

## 2016-11-09 MED ORDER — SENNA 8.6 MG PO TABS
1.0000 | ORAL_TABLET | Freq: Every day | ORAL | Status: DC
  Administered 2016-11-09 – 2016-11-10 (×2): 8.6 mg via ORAL
  Filled 2016-11-09 (×2): qty 1

## 2016-11-09 MED ORDER — FLEET ENEMA 7-19 GM/118ML RE ENEM
1.0000 | ENEMA | Freq: Every day | RECTAL | Status: DC | PRN
  Administered 2016-11-10: 1 via RECTAL
  Filled 2016-11-09: qty 1

## 2016-11-09 MED ORDER — ENSURE ENLIVE PO LIQD
237.0000 mL | Freq: Two times a day (BID) | ORAL | Status: DC
Start: 2016-11-09 — End: 2016-11-11
  Administered 2016-11-09 – 2016-11-11 (×2): 237 mL via ORAL

## 2016-11-09 MED ORDER — SODIUM CHLORIDE 0.9 % IV BOLUS (SEPSIS)
500.0000 mL | Freq: Once | INTRAVENOUS | Status: AC
Start: 2016-11-09 — End: 2016-11-09
  Administered 2016-11-09: 500 mL via INTRAVENOUS

## 2016-11-09 NOTE — ED Provider Notes (Signed)
Blood pressure 160/79, pulse 71, temperature 99.4 F (37.4 C), temperature source Oral, resp. rate 18, height 5\' 7"  (1.702 m), weight 165 lb (74.8 kg), SpO2 96 %.  Assuming care from Dr. Jeraldine LootsLockwood.  In short, Christine Webster is a 76 y.o. female with a chief complaint of Failure To Thrive .  Refer to the original H&P for additional details.  The current plan of care is to follow UA and wait for social work recommendations.  09:03 AM Patient with evidence of a urinary tract infection on cath urine specimen. On my reevaluation of the patient husband says that she has been very weak and more combative since returning home after brief hospitalization and discharge on Saturday. No fevers. Patient with no evidence of urosepsis. I suspect that the urinary tract infection is contributing to her suddenly worsening condition. I will place an IV give small mount of fluid along with Rocephin. We'll discuss the case with the hospitalist regarding admission for IV antibiotics and observation.   09:35 PM  Discussed patient's case with hospitalist, Dr. Jarvis NewcomerGrunz. Patient and family (if present) updated with plan. Care transferred to hospitalist service.  I reviewed all nursing notes, vitals, pertinent old records, EKGs, labs, imaging (as available).  Procedures  None  Alona BeneJoshua Capricia Serda, MD    Maia PlanJoshua G Lonya Johannesen, MD 11/09/16 [phone]

## 2016-11-09 NOTE — Progress Notes (Signed)
CM consults for snf This is not a CM intervention SW aware and pt is for admission as observation [facility]

## 2016-11-09 NOTE — H&P (Signed)
History and Physical   Christine Webster ZOX:[id number] DOB: Mar 05, 1941 DOA: 11/08/2016  Referring MD/NP/PA: Dr. Jacqulyn BathLong, EDP PCP: Gaye AlkenBARNES,ELIZABETH STEWART, MD Outpatient Specialists: Dr. Anne HahnWillis, Neurology  Patient coming from: Home  Chief Complaint: Failure to thrive  HPI: Christine Webster is a 76 y.o. female with a history of dementia, T2DM, and recent admission for nausea and vomiting due to constipation brought to the ED on 1/28 by her husband for failure to thrive at home.   Patient is unable to provide history due to dementia. Her husband reports that she was discharged from a brief hospitalization on 1/26 and has continued to be too weak to care for. She remained on the couch the whole day and night and next day after discharge, eating a little yogurt and other finger foods at the insistence of her husband. He got her to bed with the assistance of another family member and a transfer chair but grew more agitated than she's ever been over night. She eventually worked her way to the bottom of the bed, slid down slowly with his assistance and he was unable to move her, so he called EMS. She was noted to have foul-smelling urine but denies abdominal pain, nausea, vomiting, diarrhea (hasn't had BM since discharge), and fever. She denies any pain.   Regarding her dementia, she was diagnosed with cognitive impairment in March 2017, started aricept, and follows with Dr. Anne HahnWillis. Until August she was walking unassisted, but has had progressive decline in functional status since the middle of last month. She and her husband were going out to eat and walking around a local track 5-6 times daily with a walker. She was going to PT without improvement. Now she's had significant decline in verbal output. She's had trouble with dates, forgetting her own birthday at times, but still recognizes her husband. He says he is unable to manage her at home.  ED Course: She was found to be in no distress with  stable vital signs. Potassium was 3.4, CBC normal. CXR without acute findings. She appeared too weak to be discharged back to home so she was observed overnight until a social worker could be consulted for nursing home placement. Urinalysis was ordered but not obtained until early the next morning by I/O cath and it showed evidence of UTI. [facility] called to admit for FTT due to UTI.    Review of Systems: As per HPI. All others reviewed with husband and are negative.   Past Medical History:  Diagnosis Date  . Abnormality of gait 12/31/2015  . Colon polyps   . Dementia   . Depression   . Diabetes mellitus without complication (HCC)   . Genital herpes   . GERD (gastroesophageal reflux disease)   . Hyperlipidemia   . Hyperparathyroidism (HCC)    Dr. Talmage NapBalan  . Hypertension   . Kidney stones   . Memory disorder 12/31/2015  . Obesity   . Vitamin D deficiency     Past Surgical History:  Procedure Laterality Date  . INTESTINAL MALROTATION REPAIR    . NISSEN FUNDOPLICATION     for huge hiatal hernia 07/2008  . thyroid tumor     benign    - Lives with her husband. Does not smoke, drink or use illicit drugs.  No Known Allergies  Family History  Problem Relation Age of Onset  . Sleep apnea Father   . Alcohol abuse Father   . Stroke Father   . Dementia Mother   . Parkinsonism Sister    -  Family history otherwise reviewed and not pertinent.  Prior to Admission medications   Medication Sig Start Date End Date Taking? Authorizing Provider  aspirin EC 325 MG tablet Take 325 mg by mouth every evening.   Yes Historical Provider, MD  atorvastatin (LIPITOR) 10 MG tablet Take 10 mg by mouth every evening.    Yes Historical Provider, MD  buPROPion (WELLBUTRIN XL) 150 MG 24 hr tablet Take 150 mg by mouth daily.    Yes Historical Provider, MD  cholecalciferol (VITAMIN D) 1000 units tablet Take 1,000 Units by mouth daily.   Yes Historical Provider, MD  docusate sodium (COLACE) 100 MG capsule Take 1  capsule (100 mg total) by mouth 2 (two) times daily. 11/07/16  Yes Rolly Salter, MD  donepezil (ARICEPT) 10 MG tablet Take 1 tablet (10 mg total) by mouth at bedtime. 09/14/16  Yes York Spaniel, MD  esomeprazole (NEXIUM) 40 MG capsule Take 40 mg by mouth 2 (two) times daily before a meal.    Yes Historical Provider, MD  FLUoxetine (PROZAC) 20 MG capsule Take 40 mg by mouth daily.    Yes Historical Provider, MD  feeding supplement, ENSURE ENLIVE, (ENSURE ENLIVE) LIQD Take 237 mLs by mouth 2 (two) times daily between meals. 11/07/16   Rolly Salter, MD  mirtazapine (REMERON) 15 MG tablet Take 1 tablet (15 mg total) by mouth at bedtime. 11/07/16   Rolly Salter, MD  ondansetron (ZOFRAN ODT) 4 MG disintegrating tablet Take 1 tablet (4 mg total) by mouth every 8 (eight) hours as needed for nausea or vomiting. 11/07/16   Rolly Salter, MD  polyethylene glycol (MIRALAX / Ethelene Hal) packet Take 17 g by mouth daily. 11/07/16   Rolly Salter, MD  potassium chloride (K-DUR) 10 MEQ tablet Take 1 tablet (10 mEq total) by mouth daily. 11/07/16   Rolly Salter, MD  senna (SENOKOT) 8.6 MG TABS tablet Take 1 tablet (8.6 mg total) by mouth at bedtime. 11/07/16   Rolly Salter, MD  sodium phosphate (FLEET) 7-19 GM/118ML ENEM Place 133 mLs (1 enema total) rectally daily as needed for severe constipation. 11/07/16   Rolly Salter, MD    Physical Exam: BP 160/79   Pulse 71   Temp 99.4 F (37.4 C) (Oral)   Resp 18   Ht 5\' 7"  (1.702 m)   Wt 74.8 kg (165 lb)   SpO2 96%   BMI 25.84 kg/m   Constitutional: Chronically ill-appearing 76 y.o. female in no distress, calm demeanor Eyes: Lids and conjunctivae normal, PERRL, lateral yellow ecchymosis of left eye with periorbital ecchymosis in later stages of resolution. ENMT: Mucous membranes are moist. Posterior pharynx clear of any exudate or lesions. Poor dentition.  Neck: normal, supple, no masses, no thyromegaly Respiratory: Non-labored breathing room air without  accessory muscle use. Clear breath sounds to auscultation bilaterally Cardiovascular: Regular rate and rhythm, II/VI systolic murmur at RUSB without rubs, or gallops. No carotid bruits. No JVD. No LE edema. 2+ pedal pulses. Abdomen: Normoactive bowel sounds. No tenderness, non-distended, and no masses palpated. No hepatosplenomegaly. GU: No indwelling catheter Musculoskeletal: No clubbing / cyanosis. No joint deformity upper and lower extremities. Good ROM, no contractures. Normal muscle tone.  Skin: Warm, dry. No rashes, wounds, no ulcers. Neurologic: CN II-XII grossly intact on limited exam. Pt not cooperative. Gait not assessed. Speech without aphasia. No focal deficits in motor strength or sensation in all extremities.  Psychiatric: Alert and oriented to person and [facility]. Impaired  cognition, withdrawn affect.   Labs on Admission: I have personally reviewed following labs and imaging studies  CBC:  Recent Labs Lab 11/06/16 1334 11/07/16 0544 11/08/16 2155  WBC 8.1 7.2 8.9  NEUTROABS 5.9  --  6.1  HGB 15.2* 12.5 12.0  HCT 44.3 38.6 36.3  MCV 82.2 83.5 82.5  PLT 237 233 227   Basic Metabolic Panel:  Recent Labs Lab 11/04/16 1342 11/06/16 1334 11/07/16 0544 11/08/16 2155  NA 141 138 141 135  K 3.1* 2.9* 3.6 3.4*  CL 97 98* 104 100*  CO2 27 27 25 29   GLUCOSE 149* 141* 116* 95  BUN 7* 7 6 7   CREATININE 0.78 0.84 0.82 0.79  CALCIUM 10.8* 12.4* 10.5* 9.8   GFR: Estimated Creatinine Clearance: 66.1 mL/min (by C-G formula based on SCr of 0.79 mg/dL). Liver Function Tests:  Recent Labs Lab 11/04/16 1342 11/06/16 1334 11/08/16 2155  AST 15 22 23   ALT 14 17 15   ALKPHOS 103 82 77  BILITOT 1.0 1.4* 1.0  PROT 6.9 6.5 6.2*  ALBUMIN 4.3 3.7 3.6    Recent Labs Lab 11/06/16 1334 11/08/16 2155  LIPASE 19 18   Cardiac Enzymes:  Recent Labs Lab 11/08/16 2155  TROPONINI <0.03   Urine analysis:    Component Value Date/Time   COLORURINE YELLOW 11/09/2016  0814   APPEARANCEUR HAZY (A) 11/09/2016 0814   LABSPEC 1.009 11/09/2016 0814   PHURINE 8.0 11/09/2016 0814   GLUCOSEU NEGATIVE 11/09/2016 0814   HGBUR MODERATE (A) 11/09/2016 0814   BILIRUBINUR NEGATIVE 11/09/2016 0814   KETONESUR NEGATIVE 11/09/2016 0814   PROTEINUR NEGATIVE 11/09/2016 0814   UROBILINOGEN 1.0 01/09/2010 1720   NITRITE POSITIVE (A) 11/09/2016 0814   LEUKOCYTESUR LARGE (A) 11/09/2016 0814   Recent Results (from the past 240 hour(s))  Urine culture     Status: None   Collection Time: 11/06/16  3:03 PM  Result Value Ref Range Status   Specimen Description URINE, CATHETERIZED  Final   Special Requests NONE  Final   Culture NO GROWTH  Final   Report Status 11/07/2016 FINAL  Final     Radiological Exams on Admission: Dg Chest 2 View  Result Date: 11/08/2016 CLINICAL DATA:  Recent falls.  Failure to thrive. EXAM: CHEST  2 VIEW COMPARISON:  11/06/2016 FINDINGS: Shallow lung volumes. No confluent consolidation. Curvilinear opacities bilaterally may represent scarring or atelectasis. No pleural effusions. Hilar and mediastinal contours are unremarkable and unchanged. IMPRESSION: Low lung volumes. Linear scarring or atelectasis, without confluent consolidation. No effusions. Electronically Signed   By: Ellery Plunk M.D.   On: 11/08/2016 21:19   Assessment/Plan Principal Problem:   UTI (urinary tract infection) Active Problems:   Failure to thrive in adult   Hypokalemia   Hypertension   Diabetes mellitus without complication (HCC)   GERD (gastroesophageal reflux disease)   Hyperparathyroidism (HCC)   Dementia   Failure to thrive: Chronic dementia progressing with additional UTI worsening inanition.  - Treat UTI as below - PT/OT ordered. Formerly recommended home health, but will likely require SNF. Discussed with Child psychotherapist.  - Nutrition consult, continuing ensure and remeron as appetite stimulant (will require increased dose for this indication)  Dementia:  Chronic with rapid progression over the past month. MMSE at neurologist's office 1/24 was 12 out of 30 (from 27/30 in August). Also has progressive gait disorder. Recent lab work up per neurology was negative. MRI was planned but has not been performed. - Continue aricept, fluoxetine, and wellbutrin. Monitor  for serotonergic toxicity with addition of remeron. - With additional gait disturbance, will order MRI for further investigation per neurology recommendations  UTI: Nitrite+ UTI on cath specimen without SIRS, possibly causing acute worsening of cognitive function. Only positive culture in her chart was for K. pneumoniae with only ampicillin resistance, and intermediate to macrobid. - Treat w/CTX pending culture data  Hypokalemia: Mild, recurrent likely due to poor po.  - Replete in IVF and recheck with Mg in AM  Diet-controlled T2DM: Glycemic control expected to be uncomplicated with such poor po.  - Check glucose with lab work. - Will allow regular diet to improve po intake  - Continuing low dose lipitor, but would strongly consider stopping this due to age and chronic illness.  Hyperparathyroidism: Chronic, stable. Calcium 9.8 on admission, near baseline of 10.  - Monitor   GERD: Chronic, stable.  - Continue PPI  Chronic constipation:  - Continue home treatment  DVT prophylaxis: SCDs due to fall risk  Code Status: Full code  Family Communication: Husband at bedside Disposition Plan: Anticipate DC in 24-48 hours to SNF Consults called: Social work  Admission status: Observation    Hazeline Junker, MD [facility] Pager [phone]  If 7PM-7AM, please contact night-coverage [email or web address] Password [facility] 11/09/2016, 9:43 AM

## 2016-11-09 NOTE — ED Notes (Signed)
No respiratory or acute distress noted resting in bed with eyes closed call light in reach bed alarm on and functioning.

## 2016-11-10 DIAGNOSIS — E213 Hyperparathyroidism, unspecified: Secondary | ICD-10-CM

## 2016-11-10 DIAGNOSIS — R627 Adult failure to thrive: Secondary | ICD-10-CM | POA: Diagnosis not present

## 2016-11-10 DIAGNOSIS — K219 Gastro-esophageal reflux disease without esophagitis: Secondary | ICD-10-CM

## 2016-11-10 DIAGNOSIS — G308 Other Alzheimer's disease: Secondary | ICD-10-CM | POA: Diagnosis not present

## 2016-11-10 DIAGNOSIS — N390 Urinary tract infection, site not specified: Secondary | ICD-10-CM | POA: Diagnosis not present

## 2016-11-10 DIAGNOSIS — E876 Hypokalemia: Secondary | ICD-10-CM

## 2016-11-10 DIAGNOSIS — E119 Type 2 diabetes mellitus without complications: Secondary | ICD-10-CM | POA: Diagnosis not present

## 2016-11-10 DIAGNOSIS — I10 Essential (primary) hypertension: Secondary | ICD-10-CM

## 2016-11-10 LAB — BASIC METABOLIC PANEL
Anion gap: 9 (ref 5–15)
BUN: 6 mg/dL (ref 6–20)
CALCIUM: 10.5 mg/dL — AB (ref 8.9–10.3)
CO2: 28 mmol/L (ref 22–32)
Chloride: 103 mmol/L (ref 101–111)
Creatinine, Ser: 0.82 mg/dL (ref 0.44–1.00)
GFR calc Af Amer: >60 mL/min (ref >60)
Glucose, Bld: 146 mg/dL — ABNORMAL HIGH (ref 65–99)
POTASSIUM: 3.5 mmol/L (ref 3.5–5.1)
SODIUM: 140 mmol/L (ref 135–145)

## 2016-11-10 LAB — MAGNESIUM: MAGNESIUM: 1.7 mg/dL (ref 1.7–2.4)

## 2016-11-10 MED ORDER — STERILE WATER FOR INJECTION IJ SOLN
INTRAMUSCULAR | Status: AC
  Administered 2016-11-10: 10 mL
  Filled 2016-11-10: qty 10

## 2016-11-10 MED ORDER — ENOXAPARIN SODIUM 40 MG/0.4ML ~~LOC~~ SOLN
40.0000 mg | SUBCUTANEOUS | Status: DC
  Administered 2016-11-10: 40 mg via SUBCUTANEOUS
  Filled 2016-11-10: qty 0.4

## 2016-11-10 MED ORDER — CEFTRIAXONE SODIUM 1 G IJ SOLR
1.0000 g | INTRAMUSCULAR | Status: DC
  Administered 2016-11-10: 1 g via INTRAMUSCULAR
  Filled 2016-11-10 (×3): qty 10

## 2016-11-10 NOTE — Clinical Social Work Note (Addendum)
Clinical Social Work Assessment  Patient Details  Name: Christine Webster MRN: [id number] Date of Birth: 05/20/1941  Date of referral:  11/10/16               Reason for consult:  Discharge Planning                Permission sought to share information with:  Facility Contact Representative Permission granted to share information::  Yes, Verbal Permission Granted  Name::        Agency::     Relationship::     Contact Information:     Housing/Transportation Living arrangements for the past 2 months:  Single Family Home Source of Information:  Spouse Patient Interpreter Needed:  None Criminal Activity/Legal Involvement Pertinent to Current Situation/Hospitalization:  No - Comment as needed Significant Relationships:  Spouse Lives with:  Spouse Do you feel safe going back to the place where you live?   (SNF recommended.) Need for family participation in patient care:  Yes (Comment)  Care giving concerns:  Pt's care cannot be managed at home following hospital d/c.   Social Worker assessment / plan:  Pt hospitalized under observation status on 11/09/16 from home with UTI and Failure to Thrive. PT has recommended SNF placement. CSW met with pt's spouse outside room while pt was receiving nsg care. Spouse ia in agreement with plan for [facility] and reports that he believes his wife will accept this plan. SNF search has been initiated and bed offers are pending. CSW will continue to follow to assist with d/c planning to SNF.  Employment status:  Retired Insurance information:  Managed Medicare PT Recommendations:  Skilled Nursing Facility Information / Referral to community resources:  Skilled Nursing Facility  Patient/Family's Response to care:  Spouse is in agreement with plan for SNF at d/c.  Patient/Family's Understanding of and Emotional Response to Diagnosis, Current Treatment, and Prognosis:  Spouse is aware of pt's medical status and appreciates CSW assistance with d/c  planning.  Emotional Assessment Appearance:  Appears stated age Attitude/Demeanor/Rapport:  Unable to Assess Affect (typically observed):  Unable to Assess Orientation:  Oriented to Self Alcohol / Substance use:  Not Applicable Psych involvement (Current and /or in the community):  No (Comment)  Discharge Needs  Concerns to be addressed:  Discharge Planning Concerns Readmission within the last 30 days:  Yes Current discharge risk:  None Barriers to Discharge:  No Barriers Identified   ,  Lee, LCSW  [phone] 11/10/2016, 12:18 PM  

## 2016-11-10 NOTE — Clinical Social Work Placement (Signed)
   CLINICAL SOCIAL WORK PLACEMENT  NOTE  Date:  11/10/2016  Patient Details  Name: Christine Webster MRN: [id number] Date of Birth: 1941-03-02  Clinical Social Work is seeking post-discharge placement for this patient at the Skilled  Nursing Facility level of care (*CSW will initial, date and re-position this form in  chart as items are completed):  Yes   Patient/family provided with Deputy Clinical Social Work Department's list of facilities offering this level of care within the geographic area requested by the patient (or if unable, by the patient's family).  Yes   Patient/family informed of their freedom to choose among providers that offer the needed level of care, that participate in Medicare, Medicaid or managed care program needed by the patient, have an available bed and are willing to accept the patient.  Yes   Patient/family informed of [facility]'s ownership interest in [facility] and [facility], as well as of the fact that they are under no obligation to receive care at these facilities.  PASRR submitted to EDS on 11/10/16     PASRR number received on 11/10/16     Existing PASRR number confirmed on       FL2 transmitted to all facilities in geographic area requested by pt/family on 11/10/16     FL2 transmitted to all facilities within larger geographic area on       Patient informed that his/her managed care company has contracts with or will negotiate with certain facilities, including the following:            Patient/family informed of bed offers received.  Patient chooses bed at       Physician recommends and patient chooses bed at      Patient to be transferred to   on  .  Patient to be transferred to facility by       Patient family notified on   of transfer.  Name of family member notified:        PHYSICIAN       Additional Comment:    _______________________________________________ Royetta AsalHaidinger, Tuck Dulworth Lee, Alexander MtLCSW   [phone] 11/10/2016, 12:27 PM

## 2016-11-10 NOTE — Progress Notes (Signed)
PROGRESS NOTE    Christine Webster  ZOX:[id number] DOB: 03-Apr-1941 DOA: 11/08/2016 PCP: Gaye AlkenBARNES,ELIZABETH STEWART, MD    Brief Narrative: Christine Webster is a 76 y.o. female with a history of dementia, Diabetes mellitus. Patient presented to the ED for failure to thrive.    Assessment & Plan:   Principal Problem:   UTI (urinary tract infection) Active Problems:   Failure to thrive in adult   Hypokalemia   Hypertension   Diabetes mellitus without complication (HCC)   GERD (gastroesophageal reflux disease)   Hyperparathyroidism (HCC)   Dementia  Failure to thrive Chronic dementia. Follows neurology as an outpatient. -consult dietician  Dementia Rapid progression. Unknown cause for dementia. MRI insignificant for acute process. -continue Aricept -continue Fluocetine -continue Wellbutrin  UTI Urinalysis suggests possible infection which could be contributing to worsened cognitive function. -continue Ceftriaxone -urine culture pending  Hypokalemia Improved -repeat BMP in AM  Diabetes mellitus, type 2 Diet controlled.  Hyperparathyroidism Chronic and stable.  GERD Chronic and stable.  Chronic constipation -continue Miralax -continue Senna -continue Fleet prn -continue Colace  DVT prophylaxis: SCDs, Lovenox Code Status: Full code Family Communication: Husband at bedside Disposition Plan: Anticipate discharge to SNF when medically stable in 1-2 days   Consultants:   None  Procedures:  None  Antimicrobials:  Ceftriaxone (1/29>>    Subjective: Patient reports no pain. No nausea or vomiting.   Objective: Vitals:   11/09/16 1059 11/09/16 1145 11/09/16 2251 11/10/16 0508  BP:  (!) 124/100  (!) 174/100  Pulse:  67 68 82  Resp:  17 16 16   Temp: 98.7 F (37.1 C) 99 F (37.2 C) 98.3 F (36.8 C) 98.6 F (37 C)  TempSrc:  Oral Oral Axillary  SpO2:  97% 97% 95%  Weight:      Height:        Intake/Output Summary (Last 24 hours) at  11/10/16 1015 Last data filed at 11/09/16 1801  Gross per 24 hour  Intake            787.5 ml  Output                0 ml  Net            787.5 ml   Filed Weights   11/08/16 1847  Weight: 74.8 kg (165 lb)    Examination:  General exam: Appears calm and comfortable.  Respiratory system: Clear to auscultation. Respiratory effort normal. Cardiovascular system: S1 & S2 heard, RRR. No murmurs, rubs, gallops or clicks. Gastrointestinal system: Abdomen is nondistended, soft and nontender. No organomegaly or masses felt. Normal bowel sounds heard. Central nervous system: Alert. Will not answer questions with verbal responses but shakes and nods her head. Will not open her eyes for me but smiles when I make jokes about her behavior Extremities: No edema. No calf tenderness Skin: No cyanosis. No rashes Psychiatry: Judgement and insight not assess. Mood & affect not assessed    Data Reviewed: I have personally reviewed following labs and imaging studies  CBC:  Recent Labs Lab 11/06/16 1334 11/07/16 0544 11/08/16 2155  WBC 8.1 7.2 8.9  NEUTROABS 5.9  --  6.1  HGB 15.2* 12.5 12.0  HCT 44.3 38.6 36.3  MCV 82.2 83.5 82.5  PLT 237 233 227   Basic Metabolic Panel:  Recent Labs Lab 11/04/16 1342 11/06/16 1334 11/07/16 0544 11/08/16 2155 11/10/16 0416  NA 141 138 141 135 140  K 3.1* 2.9* 3.6 3.4* 3.5  CL 97 98*  104 100* 103  CO2 27 27 25 29 28   GLUCOSE 149* 141* 116* 95 146*  BUN 7* 7 6 7 6   CREATININE 0.78 0.84 0.82 0.79 0.82  CALCIUM 10.8* 12.4* 10.5* 9.8 10.5*  MG  --   --   --   --  1.7   GFR: Estimated Creatinine Clearance: 64.5 mL/min (by C-G formula based on SCr of 0.82 mg/dL). Liver Function Tests:  Recent Labs Lab 11/04/16 1342 11/06/16 1334 11/08/16 2155  AST 15 22 23   ALT 14 17 15   ALKPHOS 103 82 77  BILITOT 1.0 1.4* 1.0  PROT 6.9 6.5 6.2*  ALBUMIN 4.3 3.7 3.6    Recent Labs Lab 11/06/16 1334 11/08/16 2155  LIPASE 19 18    Recent Labs Lab  11/04/16 1342  AMMONIA 73   Coagulation Profile: No results for input(s): INR, PROTIME in the last 168 hours. Cardiac Enzymes:  Recent Labs Lab 11/08/16 2155  TROPONINI <0.03   BNP (last 3 results) No results for input(s): PROBNP in the last 8760 hours. HbA1C: No results for input(s): HGBA1C in the last 72 hours. CBG: No results for input(s): GLUCAP in the last 168 hours. Lipid Profile: No results for input(s): CHOL, HDL, LDLCALC, TRIG, CHOLHDL, LDLDIRECT in the last 72 hours. Thyroid Function Tests: No results for input(s): TSH, T4TOTAL, FREET4, T3FREE, THYROIDAB in the last 72 hours. Anemia Panel: No results for input(s): VITAMINB12, FOLATE, FERRITIN, TIBC, IRON, RETICCTPCT in the last 72 hours. Sepsis Labs:  Recent Labs Lab 11/06/16 1345  LATICACIDVEN 1.06    Recent Results (from the past 240 hour(s))  Urine culture     Status: None   Collection Time: 11/06/16  3:03 PM  Result Value Ref Range Status   Specimen Description URINE, CATHETERIZED  Final   Special Requests NONE  Final   Culture NO GROWTH  Final   Report Status 11/07/2016 FINAL  Final         Radiology Studies: Dg Chest 2 View  Result Date: 11/08/2016 CLINICAL DATA:  Recent falls.  Failure to thrive. EXAM: CHEST  2 VIEW COMPARISON:  11/06/2016 FINDINGS: Shallow lung volumes. No confluent consolidation. Curvilinear opacities bilaterally may represent scarring or atelectasis. No pleural effusions. Hilar and mediastinal contours are unremarkable and unchanged. IMPRESSION: Low lung volumes. Linear scarring or atelectasis, without confluent consolidation. No effusions. Electronically Signed   By: Ellery Plunk M.D.   On: 11/08/2016 21:19   Mr Brain Wo Contrast  Result Date: 11/09/2016 CLINICAL DATA:  76 y/o F; history of dementia with progressive encephalopathy. EXAM: MRI HEAD WITHOUT CONTRAST TECHNIQUE: Multiplanar, multiecho pulse sequences of the brain and surrounding structures were obtained  without intravenous contrast. COMPARISON:  11/06/2016 CT head.  12/20/2015 MRI head. FINDINGS: Brain: No acute infarction, hemorrhage, hydrocephalus, extra-axial collection or mass lesion. Stable advanced parenchymal volume loss of the brain. Nonspecific foci of T2 FLAIR hyperintensity in subcortical and periventricular white matter are consistent with moderate chronic microvascular ischemic changes and mildly progressed from prior MRI. Vascular: Normal flow voids. 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 No abnormal bone marrow signal. Sinuses/Orbits: Negative. Other: None. IMPRESSION: 1. No acute intracranial abnormality. 2. Stable advanced parenchymal volume loss of the brain. 3. Moderate chronic microvascular ischemic changes, mildly progressed from prior MRI. Electronically Signed   By: Mitzi Hansen M.D.   On: 11/09/2016 22:14        Scheduled Meds: . aspirin EC  325 mg Oral QPM  .  atorvastatin  10 mg Oral QPM  . buPROPion  150 mg Oral Daily  . cefTRIAXone (ROCEPHIN)  IV  1 g Intravenous Q24H  . docusate sodium  100 mg Oral BID  . donepezil  10 mg Oral QHS  . feeding supplement (ENSURE ENLIVE)  237 mL Oral BID BM  . FLUoxetine  40 mg Oral Daily  . mirtazapine  15 mg Oral QHS  . pantoprazole  40 mg Oral Daily  . polyethylene glycol  17 g Oral Daily  . senna  1 tablet Oral QHS   Continuous Infusions: . dextrose 5 % and 0.45 % NaCl with KCl 10 mEq/L 75 mL/hr at 11/10/16 0954     LOS: 0 days     Jacquelin Hawking [facility] 11/10/2016, 10:15 AM Pager: [phone]  If 7PM-7AM, please contact night-coverage [email or web address] Password [email or web address] 11/10/2016, 10:15 AM

## 2016-11-10 NOTE — Progress Notes (Signed)
Initial Nutrition Assessment  DOCUMENTATION CODES:   Severe malnutrition in context of acute illness/injury  INTERVENTION:   Continue Ensure Enlive po BID, each supplement provides 350 kcal and 20 grams of protein Provide Magic cup BID with meals, each supplement provides 290 kcal and 9 grams of protein RD to continue to monitor  NUTRITION DIAGNOSIS:   Malnutrition related to acute illness as evidenced by energy intake < or equal to 50% for > or equal to 5 days, percent weight loss.  GOAL:   Patient will meet greater than or equal to 90% of their needs  MONITOR:   PO intake, Supplement acceptance, Labs, Weight trends, I & O's  REASON FOR ASSESSMENT:   Consult Assessment of nutrition requirement/status  ASSESSMENT:    76 y.o. female with a history of dementia, Diabetes mellitus. Patient presented to the ED for failure to thrive.   Patient in room asleep with husband at bedside. [facility] by nutrition during previous admission (1/27). Per pt's husband, pt's appetite has been poor for the past month. Pt will eat 2 small meals a day which may include 1/2 of a McDonald's parfait, a few chicken nuggets and fries or 1/2 peanut butter sandwich. Pt may have a cookie in between but that is the majority of her intake. Pt has had no issues with swallowing but sometimes bites her tongue when chewing. Pt's husband states that with continued encouragement during meals, the patient will refuse to eat more. Pt likes Ensure supplements, will continue order. Pt's husband states she likes ice cream. RD will order Magic cups with meals.  Pt has not eaten today but did consume 1/2 grilled cheese sandwich yesterday.   Per pt's husband, UBW is 185-190 lb. Per chart review, pt has lost 15 lb since 1/2 (8% wt loss x < 1 month, significant for time frame). Nutrition focused physical exam shows no sign of depletion of muscle mass or body fat.   Medications: Colace capsule BID,  Remeron tablet daily, Protonix tablet daily, Miralax packet daily, Senokot tablet daily, Zofran-ODT PRN Labs reviewed: Mg/K WNL  Diet Order:  Diet regular Room service appropriate? Yes; Fluid consistency: Thin  Skin:  Reviewed, no issues  Last BM:  1/27  Height:   Ht Readings from Last 1 Encounters:  11/08/16 5\' 7"  (1.702 m)    Weight:   Wt Readings from Last 1 Encounters:  11/08/16 165 lb (74.8 kg)    Ideal Body Weight:  61.36 kg  BMI:  Body mass index is 25.84 kg/m.  Estimated Nutritional Needs:   Kcal:  1650-1850  Protein:  75-85g  Fluid:  1.6-1.8L/day  EDUCATION NEEDS:   No education needs identified at this time  Tilda FrancoLindsey Sameka Bagent, MS, RD, LDN Pager: [phone] After Hours Pager: [phone]

## 2016-11-10 NOTE — Progress Notes (Signed)
OT Cancellation Note  Patient Details Name: Christine Webster MRN: [id number] DOB: 1941-08-15   Cancelled Treatment:    Reason Eval/Treat Not Completed: Other (comment)  Noted PT eval and plan for SNF- will defer OT eval to SNF Thanks, Lise AuerLori Nidal Rivet, OT [phone]  Einar CrowEDDING, Verba Ainley D 11/10/2016, 11:46 AM

## 2016-11-10 NOTE — Evaluation (Signed)
Physical Therapy Evaluation Patient Details Name: Christine Webster MRN: [id number] DOB: 1940/11/18 Today's Date: 11/10/2016   History of Present Illness  76 yo female admitted with UTI, fall. Hx of dementia, obesity, DM.   Clinical Impression  On eval, pt required Total assist for bed mobility. She sat EOB ~2-3 minutes with Mod assist for sitting balance. Pt kept eyes closed for majority of session. She was able to follow 1 step multimodal cueing when asked to move UEs and LEs. Per RN, pt was given meds last night to help calm her and that is why she is likely still drowsy/lethargic. Husband present during session. Will follow and progress activity as tolerated. Recommend SNF for continued rehab to improve strength and functional mobility to lessen caregiver burden.     Follow Up Recommendations SNF    Equipment Recommendations  Wheelchair cushion (measurements PT);Wheelchair (measurements PT);3in1 (PT);Hospital bed    Recommendations for Other Services       Precautions / Restrictions Precautions Precautions: Fall Precaution Comments: has fallen at home several times in past couple of weeks Restrictions Weight Bearing Restrictions: No      Mobility  Bed Mobility Overal bed mobility: Needs Assistance Bed Mobility: Supine to Sit;Sit to Supine     Supine to sit: Total assist;HOB elevated Sit to supine: Total assist;HOB elevated   General bed mobility comments: Assist for trunk and bil LEs. Increased time. Multimodal cueing required. Utilized bedpad for scooting, positioning. Sat EOB ~2-3 mintues. Increased assist needed due to pt still drowsy/lethargic from meds given last night.  Transfers                 General transfer comment: Nt-too drowsy/lethargic from meds to safely attempt  Ambulation/Gait                Stairs            Wheelchair Mobility    Modified Rankin (Stroke Patients Only)       Balance Overall balance assessment: Needs  assistance;History of Falls Sitting-balance support: Feet supported Sitting balance-Leahy Scale: Poor Sitting balance - Comments: sat EOB at least 2-3 minutes. Pt kept eyes close mostely. Required external assistance to maintain static sitting balance.  Postural control: Posterior lean                                   Pertinent Vitals/Pain Pain Assessment: Faces Faces Pain Scale: No hurt    Home Living Family/patient expects to be discharged to:: Skilled nursing facility Living Arrangements: Spouse/significant other Available Help at Discharge: Family;Available 24 hours/day Type of Home: House Home Access: Stairs to enter   Entergy CorporationEntrance Stairs-Number of Steps: 1+ threshold, states having someone build a ramp Home Layout: One level Home Equipment: Environmental consultantWalker - 2 wheels;Transport chair      Prior Function Level of Independence: Needs assistance   Gait / Transfers Assistance Needed: was walking up until about month or so ago. now assist with transfers to transport chair and then to toilet in bathroom  ADL's / Homemaking Assistance Needed: spouse recently just doing sponge baths and had HH initiated just prior to admission        Hand Dominance        Extremity/Trunk Assessment   Upper Extremity Assessment Upper Extremity Assessment: Generalized weakness    Lower Extremity Assessment Lower Extremity Assessment: Generalized weakness    Cervical / Trunk Assessment Cervical / Trunk Assessment: Kyphotic  Communication      Cognition Arousal/Alertness: Lethargic;Suspect due to medications Behavior During Therapy: [facility] for tasks assessed/performed Overall Cognitive Status: History of cognitive impairments - at baseline                 General Comments: kept eyes closed majority of session, able to follow one step commands with increased time and some multimodal cues,     General Comments      Exercises     Assessment/Plan    PT Assessment Patient  needs continued PT services  PT Problem List Decreased strength;Decreased mobility;Decreased balance;Decreased cognition;Decreased knowledge of use of DME          PT Treatment Interventions Functional mobility training;Therapeutic activities;Therapeutic exercise;Patient/family education;Balance training;DME instruction    PT Goals (Current goals can be found in the Care Plan section)  Acute Rehab PT Goals Patient Stated Goal: [facility] to improve mobility PT Goal Formulation: With family Time For Goal Achievement: 11/24/16 Potential to Achieve Goals: Fair    Frequency Min 3X/week   Barriers to discharge        Co-evaluation               End of Session   Activity Tolerance: Patient limited by lethargy Patient left: in bed;with call bell/phone within reach;with family/visitor present;with bed alarm set      Functional Assessment Tool Used: Clinical Judgement Functional Limitation: Mobility: Walking and moving around Mobility: Walking and Moving Around Current Status (J8119): At least 80 percent but less than 100 percent impaired, limited or restricted Mobility: Walking and Moving Around Goal Status [phone]): At least 40 percent but less than 60 percent impaired, limited or restricted    Time: 0950-1006 PT Time Calculation (min) (ACUTE ONLY): 16 min   Charges:   PT Evaluation $PT Eval Low Complexity: 1 Procedure     PT G Codes:   PT G-Codes **NOT FOR INPATIENT CLASS** Functional Assessment Tool Used: Clinical Judgement Functional Limitation: Mobility: Walking and moving around Mobility: Walking and Moving Around Current Status (N5621): At least 80 percent but less than 100 percent impaired, limited or restricted Mobility: Walking and Moving Around Goal Status [phone]): At least 40 percent but less than 60 percent impaired, limited or restricted    Rebeca Alert, MPT Pager: [phone]

## 2016-11-10 NOTE — NC FL2 (Signed)
[place] MEDICAID FL2 LEVEL OF CARE SCREENING TOOL     IDENTIFICATION  Patient Name: Christine PaxStella M Binegar Birthdate: 05-06-1941 Sex: female Admission Date (Current Location): 11/08/2016  [facility] [place]   Facility and Address:  [facility],  [address], [place] [id number]      Provider Number: [id number]  Attending Physician Name and Address:  Narda Bondsalph A Nettey, MD  Relative Name and Phone Number:       Current Level of Care: Hospital Recommended Level of Care: Skilled Nursing Facility Prior Approval Number:    Date Approved/Denied:   PASRR Number:    Discharge Plan: SNF    Current Diagnoses: Patient Active Problem List   Diagnosis Date Noted  . UTI (urinary tract infection) 11/09/2016  . Dementia 11/09/2016  . Constipation 11/07/2016  . Dehydration 11/06/2016  . Failure to thrive in adult 11/06/2016  . Hypokalemia 11/06/2016  . Vomiting 11/06/2016  . Hypertension   . Diabetes mellitus without complication (HCC)   . GERD (gastroesophageal reflux disease)   . Obesity   . Hyperparathyroidism (HCC)   . Transient alteration of awareness 12/31/2015  . Memory disorder 12/31/2015  . Abnormality of gait 12/31/2015    Orientation RESPIRATION BLADDER Height & Weight     Self  Normal Incontinent Weight: 165 lb (74.8 kg) Height:  5\' 7"  (170.2 cm)  BEHAVIORAL SYMPTOMS/MOOD NEUROLOGICAL BOWEL NUTRITION STATUS  Other (Comment) (no behaviors)   Continent Diet  AMBULATORY STATUS COMMUNICATION OF NEEDS Skin   Extensive Assist Verbally Normal                       Personal Care Assistance Level of Assistance  Bathing, Feeding, Dressing Bathing Assistance: Maximum assistance Feeding assistance: Limited assistance Dressing Assistance: Maximum assistance     Functional Limitations Info  Sight, Hearing, Speech Sight Info: Adequate Hearing Info: Adequate      SPECIAL CARE FACTORS FREQUENCY  PT (By licensed PT), OT (By  licensed OT)     PT Frequency: 5x wk OT Frequency: 5x wk            Contractures Contractures Info: Not present    Additional Factors Info  Code Status, Psychotropic Code Status Info: Full Code             Current Medications (11/10/2016):  This is the current hospital active medication list Current Facility-Administered Medications  Medication Dose Route Frequency Provider Last Rate Last Dose  . aspirin EC tablet 325 mg  325 mg Oral QPM Tyrone Nineyan B Grunz, MD   325 mg at 11/09/16 1947  . atorvastatin (LIPITOR) tablet 10 mg  10 mg Oral QPM Tyrone Nineyan B Grunz, MD   10 mg at 11/09/16 1947  . buPROPion (WELLBUTRIN XL) 24 hr tablet 150 mg  150 mg Oral Daily Tyrone Nineyan B Grunz, MD   150 mg at 11/09/16 1430  . cefTRIAXone (ROCEPHIN) 1 g in dextrose 5 % 50 mL IVPB  1 g Intravenous Q24H Tyrone Nineyan B Grunz, MD   1 g at 11/10/16 1033  . dextrose 5 % and 0.45 % NaCl with KCl 10 mEq/L infusion   Intravenous Continuous Tyrone Nineyan B Grunz, MD 75 mL/hr at 11/10/16 0954    . docusate sodium (COLACE) capsule 100 mg  100 mg Oral BID Tyrone Nineyan B Grunz, MD   100 mg at 11/09/16 2103  . donepezil (ARICEPT) tablet 10 mg  10 mg Oral QHS Tyrone Nineyan B Grunz, MD   10 mg at  11/09/16 2103  . feeding supplement (ENSURE ENLIVE) (ENSURE ENLIVE) liquid 237 mL  237 mL Oral BID BM Tyrone Nine, MD   237 mL at 11/09/16 1430  . FLUoxetine (PROZAC) capsule 40 mg  40 mg Oral Daily Tyrone Nine, MD   40 mg at 11/09/16 1431  . mirtazapine (REMERON) tablet 15 mg  15 mg Oral QHS Tyrone Nine, MD   15 mg at 11/09/16 2103  . ondansetron (ZOFRAN-ODT) disintegrating tablet 4 mg  4 mg Oral Q8H PRN Tyrone Nine, MD   4 mg at 11/10/16 1033  . pantoprazole (PROTONIX) EC tablet 40 mg  40 mg Oral Daily Tyrone Nine, MD      . polyethylene glycol (MIRALAX / GLYCOLAX) packet 17 g  17 g Oral Daily Tyrone Nine, MD      . senna [facility]) tablet 8.6 mg  1 tablet Oral QHS Tyrone Nine, MD   8.6 mg at 11/09/16 2103  . sodium phosphate (FLEET) 7-19 GM/118ML enema 1 enema  1 enema  Rectal Daily PRN Tyrone Nine, MD         Discharge Medications: Please see discharge summary for a list of discharge medications.  Relevant Imaging Results:  Relevant Lab Results:   Additional Information SS # [id number]  Alvenia Treese, Dickey Gave, LCSW

## 2016-11-10 NOTE — Care Management Obs Status (Signed)
MEDICARE OBSERVATION STATUS NOTIFICATION   Patient Details  Name: Christine Webster MRN: [id number] Date of Birth: [birth year]   Medicare Observation Status Notification Given:       Bartholome BillCLEMENTS, Oluwatobiloba Martin H, RN 11/10/2016, 1:49 PM

## 2016-11-11 ENCOUNTER — Telehealth: Payer: Self-pay | Admitting: Neurology

## 2016-11-11 DIAGNOSIS — N3000 Acute cystitis without hematuria: Secondary | ICD-10-CM

## 2016-11-11 DIAGNOSIS — R410 Disorientation, unspecified: Secondary | ICD-10-CM

## 2016-11-11 LAB — URINE CULTURE: Culture: >=100000 — AB

## 2016-11-11 MED ORDER — CEPHALEXIN 500 MG PO CAPS: 500.0000 mg | ORAL_CAPSULE | Freq: Two times a day (BID) | ORAL | 0 refills | Status: AC

## 2016-11-11 NOTE — Addendum Note (Signed)
Addended by: Stephanie AcreWILLIS, CHARLES on: 11/11/2016 07:15 PM   Modules accepted: Orders

## 2016-11-11 NOTE — Telephone Encounter (Signed)
Patient;s husband calling stating the patient has been [facility] since [facility] Friday. He wanted to let Dr. Anne HahnWillis know the hospital did an MRI on the patient. He says an MRI was ordered on her last visit on 11-04-16.

## 2016-11-11 NOTE — Progress Notes (Signed)
CSW assisting with d/c planning. Pt's [facility]. SNF bed is available today if pt is stable for d/c. CSW will continue to follow to assist with d/c planning to SNF.  Cori RazorJamie Shayli Altemose LCSW [phone]

## 2016-11-11 NOTE — Telephone Encounter (Signed)
I called the husband. The patient has recently been in the hospital with a urinary tract infection. She has had ongoing issues with nausea and vomiting.  She has had a significant weight loss of 20 pounds since mid December 2017.  [facility]. I am concerned about the rectal decline in her functional level, unless a treatable cause is found, this may result in death in the near future for the patient.  I'll set the patient up for lumbar puncture. So far, blood work that we have ordered is unremarkable, all of the blood work is not yet back.

## 2016-11-11 NOTE — Telephone Encounter (Signed)
I spoke with patients husband Chrissie NoaWilliam and he informed me that they did do a [facility] but he isn't sure if it is the same type of MRI that Dr. Anne HahnWillis wanted her to have done.. She is still in the hospital. The best number to contact Chrissie NoaWilliam [phone]

## 2016-11-11 NOTE — Discharge Summary (Signed)
Physician Discharge Summary  Christine Webster UJW:[id number] DOB: [birth year] DOA: 11/08/2016  PCP: Gaye Alken, MD  Admit date: 11/08/2016 Discharge date: 11/11/2016  Admitted From: Home Disposition:  SNF  Recommendations for Outpatient Follow-up:  1. Follow up with PCP in 1-2 weeks  Home Health: No  Equipment/Devices: None   Discharge Condition: Stable CODE STATUS: Full  Diet recommendation: General   Brief/Interim Summary: From H&P: Christine Webster is a 76 y.o. female with a history of dementia, T2DM, and recent admission for nausea and vomiting due to constipation brought to the ED on 1/28 by her husband for failure to thrive at home. Patient is unable to provide history due to dementia. Her husband reports that she was discharged from a brief hospitalization on 1/26 and has continued to be too weak to care for. She remained on the couch the whole day and night and next day after discharge, eating a little yogurt and other finger foods at the insistence of her husband. He got her to bed with the assistance of another family member and a transfer chair but grew more agitated than she's ever been over night. She eventually worked her way to the bottom of the bed, slid down slowly with his assistance and he was unable to move her, so he called EMS. She was noted to have foul-smelling urine but denies abdominal pain, nausea, vomiting, diarrhea (hasn't had BM since discharge), and fever. She denies any pain.   Regarding her dementia, she was diagnosed with cognitive impairment in March 2017, started aricept, and follows with Dr. Anne Hahn. Until August she was walking unassisted, but has had progressive decline in functional status since the middle of last month. She and her husband were going out to eat and walking around a local track 5-6 times daily with a walker. She was going to PT without improvement. Now she's had significant decline in verbal output. She's had trouble  with dates, forgetting her own birthday at times, but still recognizes her husband. He says he is unable to manage her at home.  Interim: Patient was treated for E Coli UTI with rocephin, which was transitioned to keflex when MIC resulted. She was evaluated by PT/OT and was recommended for SNF.    Discharge Diagnoses:  Principal Problem:   UTI (urinary tract infection) Active Problems:   Failure to thrive in adult   Hypokalemia   Hypertension   Diabetes mellitus without complication (HCC)   GERD (gastroesophageal reflux disease)   Hyperparathyroidism (HCC)   Dementia  Failure to thrive Chronic dementia. Follows neurology as an outpatient. -consult dietician  Dementia Rapid progression. Unknown cause for dementia. MRI insignificant for acute process. -continue Aricept -continue Fluocetine -continue Wellbutrin  E Coli UTI, POA  -Transition antibiotics to Keflex on discharge   Diabetes mellitus, type 2 Diet controlled.  Hyperparathyroidism Chronic and stable.  GERD Chronic and stable.  Chronic constipation -continue Miralax -continue Senna -continue Fleet prn -continue Colace   Discharge Instructions  Discharge Instructions    Diet - low sodium heart healthy    Complete by:  As directed    Increase activity slowly    Complete by:  As directed      Allergies as of 11/11/2016   No Known Allergies     Medication List    STOP taking these medications   potassium chloride 10 MEQ tablet Commonly known as:  K-DUR     TAKE these medications   aspirin EC 325 MG tablet Take 325 mg by  mouth every evening.   atorvastatin 10 MG tablet Commonly known as:  LIPITOR Take 10 mg by mouth every evening.   buPROPion 150 MG 24 hr tablet Commonly known as:  WELLBUTRIN XL Take 150 mg by mouth daily.   cephALEXin 500 MG capsule Commonly known as:  KEFLEX Take 1 capsule (500 mg total) by mouth 2 (two) times daily.   cholecalciferol 1000 units  tablet Commonly known as:  VITAMIN D Take 1,000 Units by mouth daily.   docusate sodium 100 MG capsule Commonly known as:  COLACE Take 1 capsule (100 mg total) by mouth 2 (two) times daily.   donepezil 10 MG tablet Commonly known as:  ARICEPT Take 1 tablet (10 mg total) by mouth at bedtime.   esomeprazole 40 MG capsule Commonly known as:  NEXIUM Take 40 mg by mouth 2 (two) times daily before a meal.   feeding supplement (ENSURE ENLIVE) Liqd Take 237 mLs by mouth 2 (two) times daily between meals.   FLUoxetine 20 MG capsule Commonly known as:  PROZAC Take 40 mg by mouth daily.   mirtazapine 15 MG tablet Commonly known as:  REMERON Take 1 tablet (15 mg total) by mouth at bedtime.   ondansetron 4 MG disintegrating tablet Commonly known as:  ZOFRAN ODT Take 1 tablet (4 mg total) by mouth every 8 (eight) hours as needed for nausea or vomiting.   polyethylene glycol packet Commonly known as:  MIRALAX / GLYCOLAX Take 17 g by mouth daily.   senna 8.6 MG Tabs tablet Commonly known as:  SENOKOT Take 1 tablet (8.6 mg total) by mouth at bedtime.   sodium phosphate 7-19 GM/118ML Enem Place 133 mLs (1 enema total) rectally daily as needed for severe constipation.       Contact information for follow-up providers    Gaye Alken, MD. Schedule an appointment as soon as possible for a visit in 1 week(s).   Specialty:  Family Medicine Contact information: [address] [place] [address] [phone]            Contact information for after-discharge care    Destination    [facility] Follow up.   Specialty:  Skilled Nursing Facility Contact information: [address] [place] [address] [phone]                 No Known Allergies  Consultations:  None   Procedures/Studies: Dg Chest 2 View  Result Date: 11/08/2016 CLINICAL DATA:  Recent falls.  Failure to thrive. EXAM: CHEST  2 VIEW COMPARISON:   11/06/2016 FINDINGS: Shallow lung volumes. No confluent consolidation. Curvilinear opacities bilaterally may represent scarring or atelectasis. No pleural effusions. Hilar and mediastinal contours are unremarkable and unchanged. IMPRESSION: Low lung volumes. Linear scarring or atelectasis, without confluent consolidation. No effusions. Electronically Signed   By: Ellery Plunk M.D.   On: 11/08/2016 21:19   Dg Chest 2 View  Result Date: 11/06/2016 CLINICAL DATA:  Fall EXAM: CHEST  2 VIEW COMPARISON:  10/13/2016 FINDINGS: Lingular scarring or atelectasis. Mild cardiomegaly. Low lung volumes. No focal opacity on the right. No effusions or acute bony abnormality. IMPRESSION: Lingular scarring or atelectasis.  Low lung volumes. Electronically Signed   By: Charlett Nose M.D.   On: 11/06/2016 14:10   Ct Head Wo Contrast  Result Date: 11/06/2016 CLINICAL DATA:  Forehead and facial bruising after falling last week. Increasing confusion for 1 month. Vomiting. EXAM: CT HEAD WITHOUT CONTRAST CT CERVICAL SPINE WITHOUT CONTRAST TECHNIQUE: Multidetector  CT imaging of the head and cervical spine was performed following the standard protocol without intravenous contrast. Multiplanar CT image reconstructions of the cervical spine were also generated. COMPARISON:  CT head 10/13/2016.  Chest CT 12/20/2015. FINDINGS: CT HEAD FINDINGS Despite efforts by the technologist and patient, motion artifact is present on today's exam and could not be eliminated. This reduces exam sensitivity and specificity. Repeat images of the head are of better quality. Brain: There is no evidence of acute intracranial hemorrhage, mass lesion, brain edema or extra-axial fluid collection. There is advanced atrophy with diffuse prominence of the ventricles and subarachnoid spaces. Chronic low-density in the periventricular white matter appears stable, likely due to chronic small vessel ischemic changes. There is no CT evidence of acute cortical  infarction. Vascular: No hyperdense vessel demonstrated. Intracranial vascular calcifications are noted. Skull: No evidence of acute calvarial fracture. There is left frontal scalp swelling. 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 Sinuses/Orbits: The visualized paranasal sinuses and mastoid air cells are clear. No orbital abnormalities are seen. Other: None. CT CERVICAL SPINE FINDINGS Alignment: Normal. Skull base and vertebrae: No evidence of acute fracture or traumatic subluxation. Soft tissues and spinal canal: No soft tissue swelling or visible canal hematoma. Carotid atherosclerosis present bilaterally. Disc levels: There is relatively mild multilevel disc space narrowing and uncinate spurring, greatest at C5-6 and C6-7. No large disc herniation or significant spinal stenosis seen. Upper chest: Appears stable. The right thyroid lobe is replaced by a 2.6 cm nodule. There is a smaller left thyroid nodule. Other: None. IMPRESSION: 1. Focal right frontal scalp soft tissue injury. No evidence of calvarial fracture or acute intracranial process. 2. Stable atrophy and chronic small vessel ischemic changes. 3. No evidence of acute cervical spine fracture, traumatic subluxation or static signs of instability. Electronically Signed   By: Carey Bullocks M.D.   On: 11/06/2016 14:48   Ct Head Wo Contrast  Result Date: 10/13/2016 CLINICAL DATA:  Decreased appetite, recent fall EXAM: CT HEAD WITHOUT CONTRAST TECHNIQUE: Contiguous axial images were obtained from the base of the skull through the vertex without intravenous contrast. COMPARISON:  12/20/2015 FINDINGS: Brain: No evidence of acute infarction, hemorrhage, extra-axial collection, ventriculomegaly, or mass effect. Generalized cerebral atrophy. Periventricular white matter low attenuation likely secondary to microangiopathy. Vascular: Cerebrovascular atherosclerotic calcifications are noted. Skull: Negative for fracture or focal lesion. Sinuses/Orbits: Visualized  portions of the orbits are unremarkable. Visualized portions of the paranasal sinuses and mastoid air cells are unremarkable. Other: None. IMPRESSION: 1. No acute intracranial pathology. 2. Chronic microvascular disease and cerebral atrophy. Electronically Signed   By: Elige Ko   On: 10/13/2016 10:11   Ct Cervical Spine Wo Contrast  Result Date: 11/06/2016 CLINICAL DATA:  Forehead and facial bruising after falling last week. Increasing confusion for 1 month. Vomiting. EXAM: CT HEAD WITHOUT CONTRAST CT CERVICAL SPINE WITHOUT CONTRAST TECHNIQUE: Multidetector CT imaging of the head and cervical spine was performed following the standard protocol without intravenous contrast. Multiplanar CT image reconstructions of the cervical spine were also generated. COMPARISON:  CT head 10/13/2016.  Chest CT 12/20/2015. FINDINGS: CT HEAD FINDINGS Despite efforts by the technologist and patient, motion artifact is present on today's exam and could not be eliminated. This reduces exam sensitivity and specificity. Repeat images of the head are of better quality. Brain: There is no evidence of acute intracranial hemorrhage, mass lesion, brain edema or extra-axial fluid collection. There is advanced atrophy with diffuse prominence of the ventricles and subarachnoid spaces. Chronic low-density  in the periventricular white matter appears stable, likely due to chronic small vessel ischemic changes. There is no CT evidence of acute cortical infarction. Vascular: No hyperdense vessel demonstrated. Intracranial vascular calcifications are noted. Skull: No evidence of acute calvarial fracture. There is left frontal scalp swelling. 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 Sinuses/Orbits: The visualized paranasal sinuses and mastoid air cells are clear. No orbital abnormalities are seen. Other: None. CT CERVICAL SPINE FINDINGS Alignment: Normal. Skull base and vertebrae: No evidence of acute fracture or traumatic subluxation. Soft  tissues and spinal canal: No soft tissue swelling or visible canal hematoma. Carotid atherosclerosis present bilaterally. Disc levels: There is relatively mild multilevel disc space narrowing and uncinate spurring, greatest at C5-6 and C6-7. No large disc herniation or significant spinal stenosis seen. Upper chest: Appears stable. The right thyroid lobe is replaced by a 2.6 cm nodule. There is a smaller left thyroid nodule. Other: None. IMPRESSION: 1. Focal right frontal scalp soft tissue injury. No evidence of calvarial fracture or acute intracranial process. 2. Stable atrophy and chronic small vessel ischemic changes. 3. No evidence of acute cervical spine fracture, traumatic subluxation or static signs of instability. Electronically Signed   By: Carey Bullocks M.D.   On: 11/06/2016 14:48   Mr Brain Wo Contrast  Result Date: 11/09/2016 CLINICAL DATA:  76 y/o F; history of dementia with progressive encephalopathy. EXAM: MRI HEAD WITHOUT CONTRAST TECHNIQUE: Multiplanar, multiecho pulse sequences of the brain and surrounding structures were obtained without intravenous contrast. COMPARISON:  11/06/2016 CT head.  12/20/2015 MRI head. FINDINGS: Brain: No acute infarction, hemorrhage, hydrocephalus, extra-axial collection or mass lesion. Stable advanced parenchymal volume loss of the brain. Nonspecific foci of T2 FLAIR hyperintensity in subcortical and periventricular white matter are consistent with moderate chronic microvascular ischemic changes and mildly progressed from prior MRI. Vascular: Normal flow voids. 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 No abnormal bone marrow signal. Sinuses/Orbits: Negative. Other: None. IMPRESSION: 1. No acute intracranial abnormality. 2. Stable advanced parenchymal volume loss of the brain. 3. Moderate chronic microvascular ischemic changes, mildly progressed from prior MRI. Electronically Signed   By: Mitzi Hansen M.D.   On:  11/09/2016 22:14   Dg Abd Acute W/chest  Result Date: 10/13/2016 CLINICAL DATA:  Pt's husband reports pt fell 2 days ago in bathroom, hitting face; reports decreased activity/appetite since then, hx of diabetes, GERD, HTN, hyperlipidemia, intestinal malrotation repair, no other complaints EXAM: DG ABDOMEN ACUTE W/ 1V CHEST COMPARISON:  12/20/2015 and 09/29/2011. FINDINGS: Normal bowel gas pattern.  No free air. No evidence of renal or ureteral stones. Soft tissues are unremarkable. Cardiac silhouette is mildly enlarged. There is a small moderate hiatal hernia. Stable scarring is noted in the left mid lung. Lungs otherwise clear. Skeletal structures are demineralized but grossly intact. IMPRESSION: 1. No acute findings.  No evidence of bowel obstruction or free air. 2. No acute cardiopulmonary disease. Electronically Signed   By: Amie Portland M.D.   On: 10/13/2016 10:19   Dg Abd Portable 1v  Result Date: 11/07/2016 CLINICAL DATA:  Nausea, vomiting EXAM: PORTABLE ABDOMEN - 1 VIEW COMPARISON:  10/13/2016 FINDINGS: The bowel gas pattern is normal. No radio-opaque calculi or other significant radiographic abnormality are seen. IMPRESSION: Negative. Electronically Signed   By: Charlett Nose M.D.   On: 11/07/2016 09:27     Discharge Exam: Vitals:   11/10/16 2111 11/11/16 0625  BP: (!) 152/96 (!) 185/99  Pulse: 83 82  Resp: 16 16  Temp: 98.1 F (36.7 C) 98.3 F (36.8 C)   Vitals:   11/10/16 0508 11/10/16 1523 11/10/16 2111 11/11/16 0625  BP: (!) 174/100 (!) 176/88 (!) 152/96 (!) 185/99  Pulse: 82 86 83 82  Resp: 16 18 16 16   Temp: 98.6 F (37 C) 98.7 F (37.1 C) 98.1 F (36.7 C) 98.3 F (36.8 C)  TempSrc: Axillary Oral Oral Oral  SpO2: 95% 95% 96% 95%  Weight:      Height:        General: Pt is alert, awake, not in acute distress, oriented to self only  Cardiovascular: RRR, S1/S2 +, no rubs, no gallops Respiratory: CTA bilaterally, no wheezing, no rhonchi Abdominal: Soft, NT, ND,  bowel sounds + Extremities: no edema, no cyanosis    The results of significant diagnostics from this hospitalization (including imaging, microbiology, ancillary and laboratory) are listed below for reference.     Microbiology: Recent Results (from the past 240 hour(s))  Urine culture     Status: None   Collection Time: 11/06/16  3:03 PM  Result Value Ref Range Status   Specimen Description URINE, CATHETERIZED  Final   Special Requests NONE  Final   Culture NO GROWTH  Final   Report Status 11/07/2016 FINAL  Final  Culture, Urine     Status: Abnormal   Collection Time: 11/09/16  8:14 AM  Result Value Ref Range Status   Specimen Description URINE, CATHETERIZED  Final   Special Requests NONE  Final   Culture >=100,000 COLONIES/mL ESCHERICHIA COLI (A)  Final   Report Status 11/11/2016 FINAL  Final   Organism ID, Bacteria ESCHERICHIA COLI (A)  Final      Susceptibility   Escherichia coli - MIC*    AMPICILLIN >=32 RESISTANT Resistant     CEFAZOLIN 16 SENSITIVE Sensitive     CEFTRIAXONE <=1 SENSITIVE Sensitive     CIPROFLOXACIN 1 SENSITIVE Sensitive     GENTAMICIN <=1 SENSITIVE Sensitive     IMIPENEM <=0.25 SENSITIVE Sensitive     NITROFURANTOIN <=16 SENSITIVE Sensitive     TRIMETH/SULFA >=320 RESISTANT Resistant     AMPICILLIN/SULBACTAM >=32 RESISTANT Resistant     PIP/TAZO <=4 SENSITIVE Sensitive     Extended ESBL NEGATIVE Sensitive     * >=100,000 COLONIES/mL ESCHERICHIA COLI     Labs: BNP (last 3 results) No results for input(s): BNP in the last 8760 hours. Basic Metabolic Panel:  Recent Labs Lab 11/04/16 1342 11/06/16 1334 11/07/16 0544 11/08/16 2155 11/10/16 0416  NA 141 138 141 135 140  K 3.1* 2.9* 3.6 3.4* 3.5  CL 97 98* 104 100* 103  CO2 27 27 25 29 28   GLUCOSE 149* 141* 116* 95 146*  BUN 7* 7 6 7 6   CREATININE 0.78 0.84 0.82 0.79 0.82  CALCIUM 10.8* 12.4* 10.5* 9.8 10.5*  MG  --   --   --   --  1.7   Liver Function Tests:  Recent Labs Lab  11/04/16 1342 11/06/16 1334 11/08/16 2155  AST 15 22 23   ALT 14 17 15   ALKPHOS 103 82 77  BILITOT 1.0 1.4* 1.0  PROT 6.9 6.5 6.2*  ALBUMIN 4.3 3.7 3.6    Recent Labs Lab 11/06/16 1334 11/08/16 2155  LIPASE 19 18    Recent Labs Lab 11/04/16 1342  AMMONIA 73   CBC:  Recent Labs Lab 11/06/16 1334 11/07/16 0544 11/08/16 2155  WBC 8.1 7.2 8.9  NEUTROABS 5.9  --  6.1  HGB 15.2* 12.5 12.0  HCT 44.3 38.6 36.3  MCV 82.2 83.5 82.5  PLT 237 233 227   Cardiac Enzymes:  Recent Labs Lab 11/08/16 2155  TROPONINI <0.03   BNP: Invalid input(s): POCBNP CBG: No results for input(s): GLUCAP in the last 168 hours. D-Dimer No results for input(s): DDIMER in the last 72 hours. Hgb A1c No results for input(s): HGBA1C in the last 72 hours. Lipid Profile No results for input(s): CHOL, HDL, LDLCALC, TRIG, CHOLHDL, LDLDIRECT in the last 72 hours. Thyroid function studies No results for input(s): TSH, T4TOTAL, T3FREE, THYROIDAB in the last 72 hours.  Invalid input(s): FREET3 Anemia work up No results for input(s): VITAMINB12, FOLATE, FERRITIN, TIBC, IRON, RETICCTPCT in the last 72 hours. Urinalysis    Component Value Date/Time   COLORURINE YELLOW 11/09/2016 0814   APPEARANCEUR HAZY (A) 11/09/2016 0814   LABSPEC 1.009 11/09/2016 0814   PHURINE 8.0 11/09/2016 0814   GLUCOSEU NEGATIVE 11/09/2016 0814   HGBUR MODERATE (A) 11/09/2016 0814   BILIRUBINUR NEGATIVE 11/09/2016 0814   KETONESUR NEGATIVE 11/09/2016 0814   PROTEINUR NEGATIVE 11/09/2016 0814   UROBILINOGEN 1.0 01/09/2010 1720   NITRITE POSITIVE (A) 11/09/2016 0814   LEUKOCYTESUR LARGE (A) 11/09/2016 0814   Sepsis Labs Invalid input(s): PROCALCITONIN,  WBC,  LACTICIDVEN Microbiology Recent Results (from the past 240 hour(s))  Urine culture     Status: None   Collection Time: 11/06/16  3:03 PM  Result Value Ref Range Status   Specimen Description URINE, CATHETERIZED  Final   Special Requests NONE  Final    Culture NO GROWTH  Final   Report Status 11/07/2016 FINAL  Final  Culture, Urine     Status: Abnormal   Collection Time: 11/09/16  8:14 AM  Result Value Ref Range Status   Specimen Description URINE, CATHETERIZED  Final   Special Requests NONE  Final   Culture >=100,000 COLONIES/mL ESCHERICHIA COLI (A)  Final   Report Status 11/11/2016 FINAL  Final   Organism ID, Bacteria ESCHERICHIA COLI (A)  Final      Susceptibility   Escherichia coli - MIC*    AMPICILLIN >=32 RESISTANT Resistant     CEFAZOLIN 16 SENSITIVE Sensitive     CEFTRIAXONE <=1 SENSITIVE Sensitive     CIPROFLOXACIN 1 SENSITIVE Sensitive     GENTAMICIN <=1 SENSITIVE Sensitive     IMIPENEM <=0.25 SENSITIVE Sensitive     NITROFURANTOIN <=16 SENSITIVE Sensitive     TRIMETH/SULFA >=320 RESISTANT Resistant     AMPICILLIN/SULBACTAM >=32 RESISTANT Resistant     PIP/TAZO <=4 SENSITIVE Sensitive     Extended ESBL NEGATIVE Sensitive     * >=100,000 COLONIES/mL ESCHERICHIA COLI     Time coordinating discharge: Over 30 minutes  SIGNED:  Noralee StainJennifer Jeanetta Alonzo, DO [facility] Pager [phone]  If 7PM-7AM, please contact night-coverage [email or web address] Password [email or web address] 11/11/2016, 12:55 PM

## 2016-11-11 NOTE — Telephone Encounter (Signed)
Pt did have MRI brain wo contrast (same test ordered by Dr. Anne HahnWIllis) on 11/09/16 that showed: 1. No acute intracranial abnormality. 2. Stable advanced parenchymal volume loss of the brain. 3. Moderate chronic microvascular ischemic changes, mildly progressed from prior MRI.

## 2016-11-18 ENCOUNTER — Telehealth: Payer: Self-pay | Admitting: Neurology

## 2016-11-18 NOTE — Telephone Encounter (Signed)
Dr Willis- please advise 

## 2016-11-18 NOTE — Telephone Encounter (Signed)
Patients husband Chrissie NoaWilliam called to see exactly what patient has been diagnosed with.  Also would like to know if patient still needs to have MRI done?  Please call

## 2016-11-18 NOTE — Telephone Encounter (Signed)
I talk with the husband. The [facility] is [facility], she is doing poorly, not eating well. The Aricept should be discontinued.  I have put in orders for lumbar puncture, this will need to be coordinated with [facility].

## 2016-11-19 NOTE — Telephone Encounter (Signed)
I have called [facility] and left Message for Shaune PascalKelly Morris [phone] scheduling coordinator  . Relayed [facility] telephone  number to call when patient is ready to have her lumbar puncture. Patient's husband is aware of details as well.

## 2016-11-20 LAB — B. BURGDORFI ANTIBODIES

## 2016-11-20 LAB — COMPREHENSIVE METABOLIC PANEL
A/G RATIO: 1.7 (ref 1.2–2.2)
ALT: 14 IU/L (ref 0–32)
AST: 15 IU/L (ref 0–40)
Albumin: 4.3 g/dL (ref 3.5–4.8)
Alkaline Phosphatase: 103 IU/L (ref 39–117)
BILIRUBIN TOTAL: 1 mg/dL (ref 0.0–1.2)
BUN/Creatinine Ratio: 9 — ABNORMAL LOW (ref 12–28)
BUN: 7 mg/dL — ABNORMAL LOW (ref 8–27)
CHLORIDE: 97 mmol/L (ref 96–106)
CO2: 27 mmol/L (ref 18–29)
Calcium: 10.8 mg/dL — ABNORMAL HIGH (ref 8.7–10.3)
Creatinine, Ser: 0.78 mg/dL (ref 0.57–1.00)
GFR calc non Af Amer: 76 mL/min/{1.73_m2} (ref >59)
GFR, EST AFRICAN AMERICAN: 87 mL/min/{1.73_m2} (ref >59)
GLOBULIN, TOTAL: 2.6 g/dL (ref 1.5–4.5)
Glucose: 149 mg/dL — ABNORMAL HIGH (ref 65–99)
POTASSIUM: 3.1 mmol/L — AB (ref 3.5–5.2)
SODIUM: 141 mmol/L (ref 134–144)
TOTAL PROTEIN: 6.9 g/dL (ref 6.0–8.5)

## 2016-11-20 LAB — PARANEOPLASTIC PROFILE 1: Neuronal Nuclear (Hu) Antibody (IB): <1:10 {titer}

## 2016-11-20 LAB — SEDIMENTATION RATE: Sed Rate: 7 mm/hr (ref 0–40)

## 2016-11-20 LAB — THYROGLOBULIN ANTIBODY: Thyroglobulin Antibody: <1 IU/mL (ref 0.0–0.9)

## 2016-11-20 LAB — AMMONIA: Ammonia: 73 ug/dL (ref 19–87)

## 2016-11-20 LAB — THYROID PEROXIDASE ANTIBODY: Thyroperoxidase Ab SerPl-aCnc: 8 IU/mL (ref 0–34)

## 2016-11-20 LAB — HIV ANTIBODY (ROUTINE TESTING W REFLEX): HIV Screen 4th Generation wRfx: NONREACTIVE

## 2016-11-20 LAB — ANA W/REFLEX: ANA: NEGATIVE

## 2016-11-20 LAB — ANGIOTENSIN CONVERTING ENZYME

## 2016-11-23 ENCOUNTER — Telehealth: Payer: Self-pay | Admitting: *Deleted

## 2016-11-23 NOTE — Telephone Encounter (Signed)
Called and spoke with husband about lab results per CW,MD note. Husband verbalized understanding.

## 2016-11-23 NOTE — Telephone Encounter (Signed)
-----   Message from York Spanielharles K Willis, MD sent at 11/21/2016 12:39 PM EST ----- Blood work is unremarkable with exception of a slightly low potassium level and a slightly high calcium level, no source of clinical deterioration has been found. ----- Message ----- From: Interface, [facility] Lab Results In Sent: 11/05/2016   7:44 AM To: York Spanielharles K Willis, MD

## 2016-11-24 ENCOUNTER — Encounter: Payer: Self-pay | Admitting: *Deleted

## 2016-11-24 NOTE — Progress Notes (Signed)
Completed medical assessment form from [facility]. Sent back to medical records to process.

## 2016-11-25 DIAGNOSIS — Z0289 Encounter for other administrative examinations: Secondary | ICD-10-CM

## 2016-12-07 ENCOUNTER — Ambulatory Visit: Payer: Medicare Other | Admitting: Neurology

## 2016-12-10 ENCOUNTER — Telehealth: Payer: Self-pay | Admitting: Neurology

## 2016-12-10 NOTE — Telephone Encounter (Signed)
Called and LVM letting her know start date 11/11/16. Gave [facility] phone number if she has further questions.

## 2016-12-10 NOTE — Telephone Encounter (Signed)
[name]/[facility] [phone] called says nursing facility advised pt's care began 1/31/8, on assessment from Dr W states 11/12/16. She needs clarification please

## 2016-12-10 NOTE — Telephone Encounter (Signed)
Dr Anne HahnWillis- are you okay for me to call and give verbal to switch start date?

## 2016-12-28 ENCOUNTER — Telehealth: Payer: Self-pay | Admitting: Neurology

## 2016-12-28 NOTE — Telephone Encounter (Signed)
Patient was scheduled to have the DG Fluoro  but per patient husband she is not having this exam..

## 2016-12-28 NOTE — Telephone Encounter (Signed)
I called the husband. Husband has decided not to do the lumbar puncture. The patient is requiring total assistance at this point, memory remains poor. They do not want to pursue further evaluation.

## 2017-02-02 ENCOUNTER — Ambulatory Visit: Payer: Medicare Other | Admitting: Neurology

## 2017-03-03 IMAGING — DX DG CHEST 2V
2 series · 3 of 3 positions shown · non-contrast
Comparison: 10/13/2016

CLINICAL DATA: Fall

EXAM:
CHEST  2 VIEW

[Series 2: chest lat · 0.14mm/px · 2 of 2 slices shown]
[im 1/2]
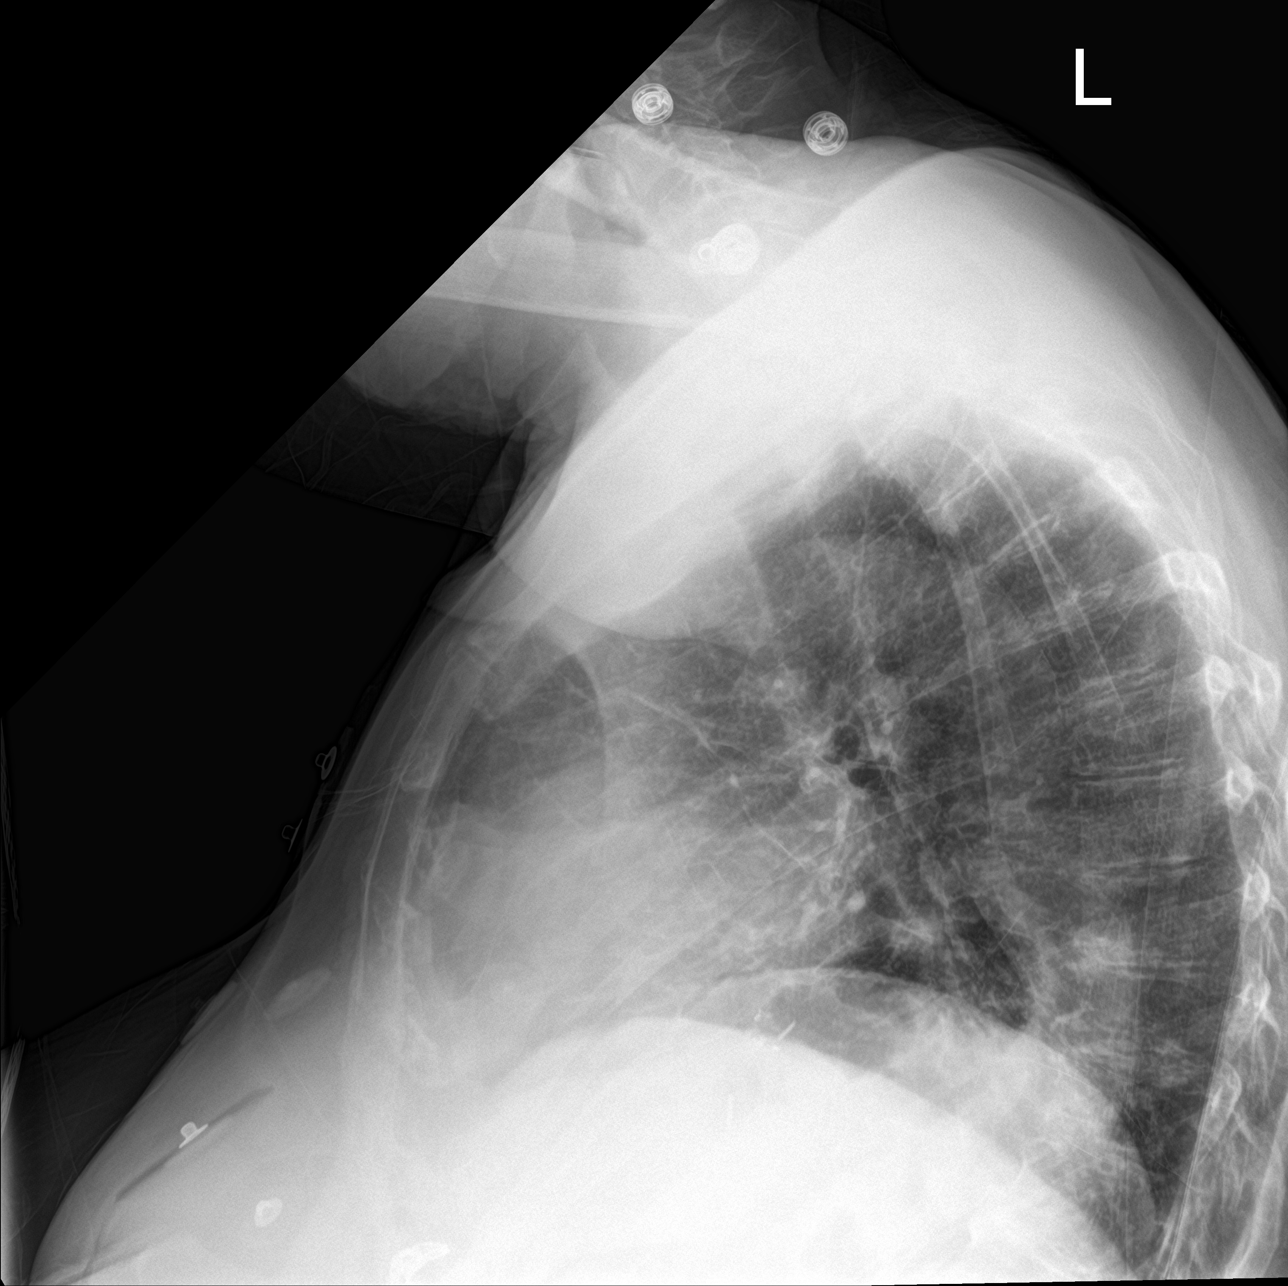
[im 2/2]
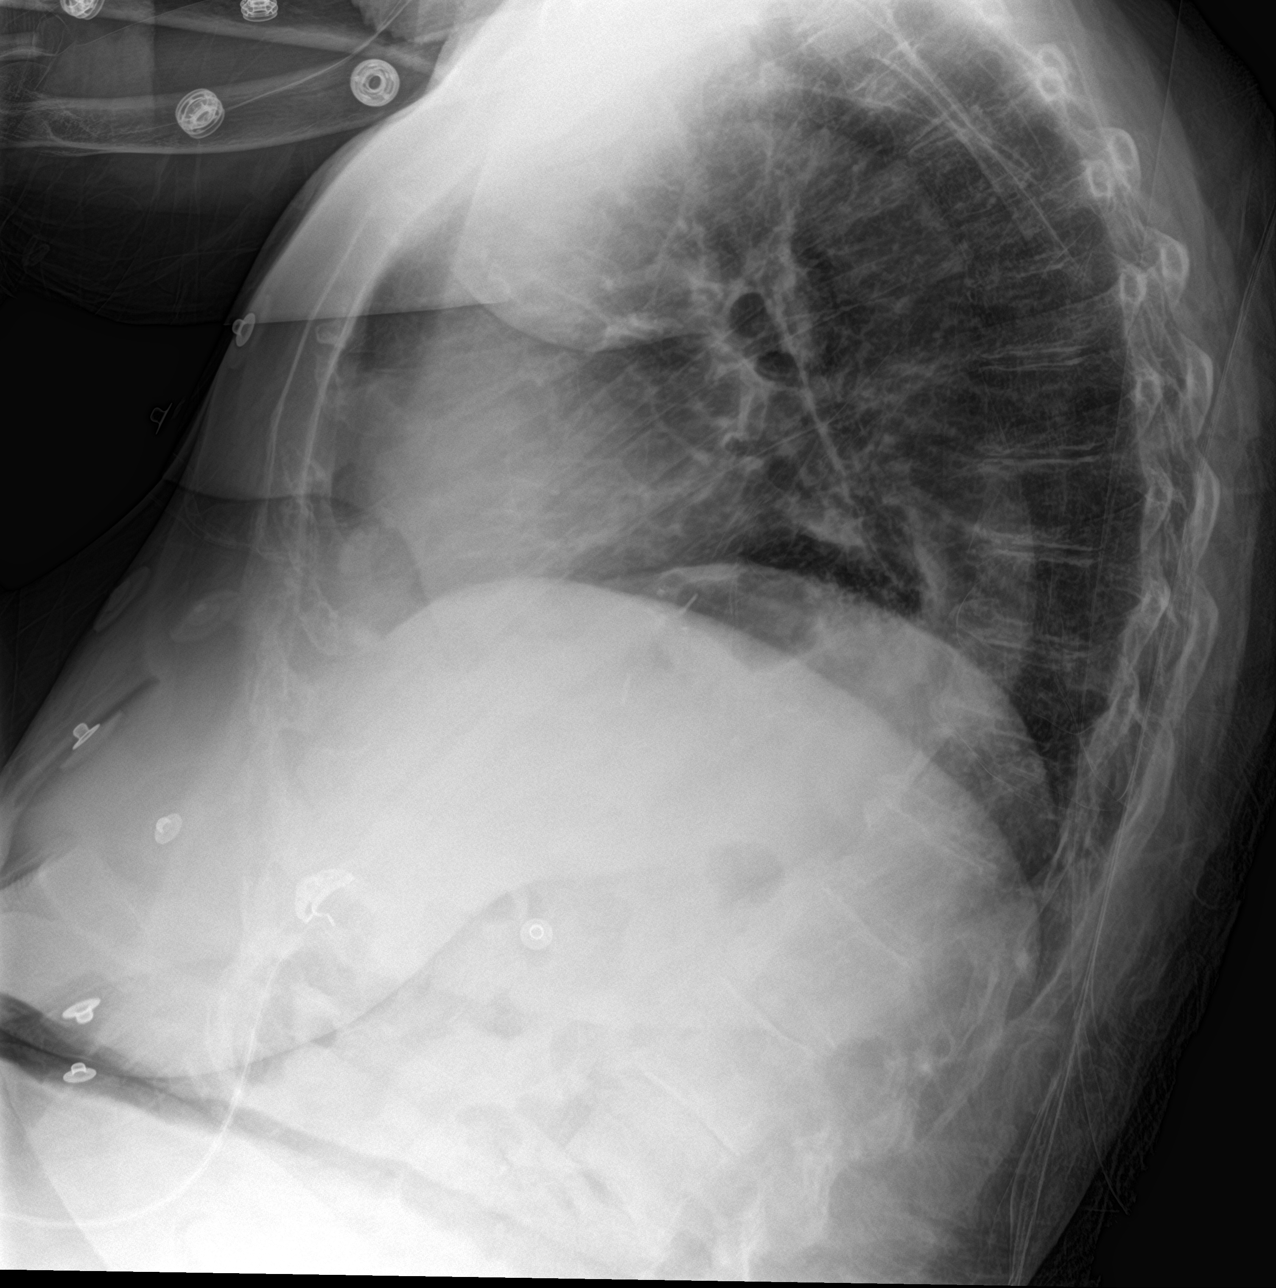

[chest ap]
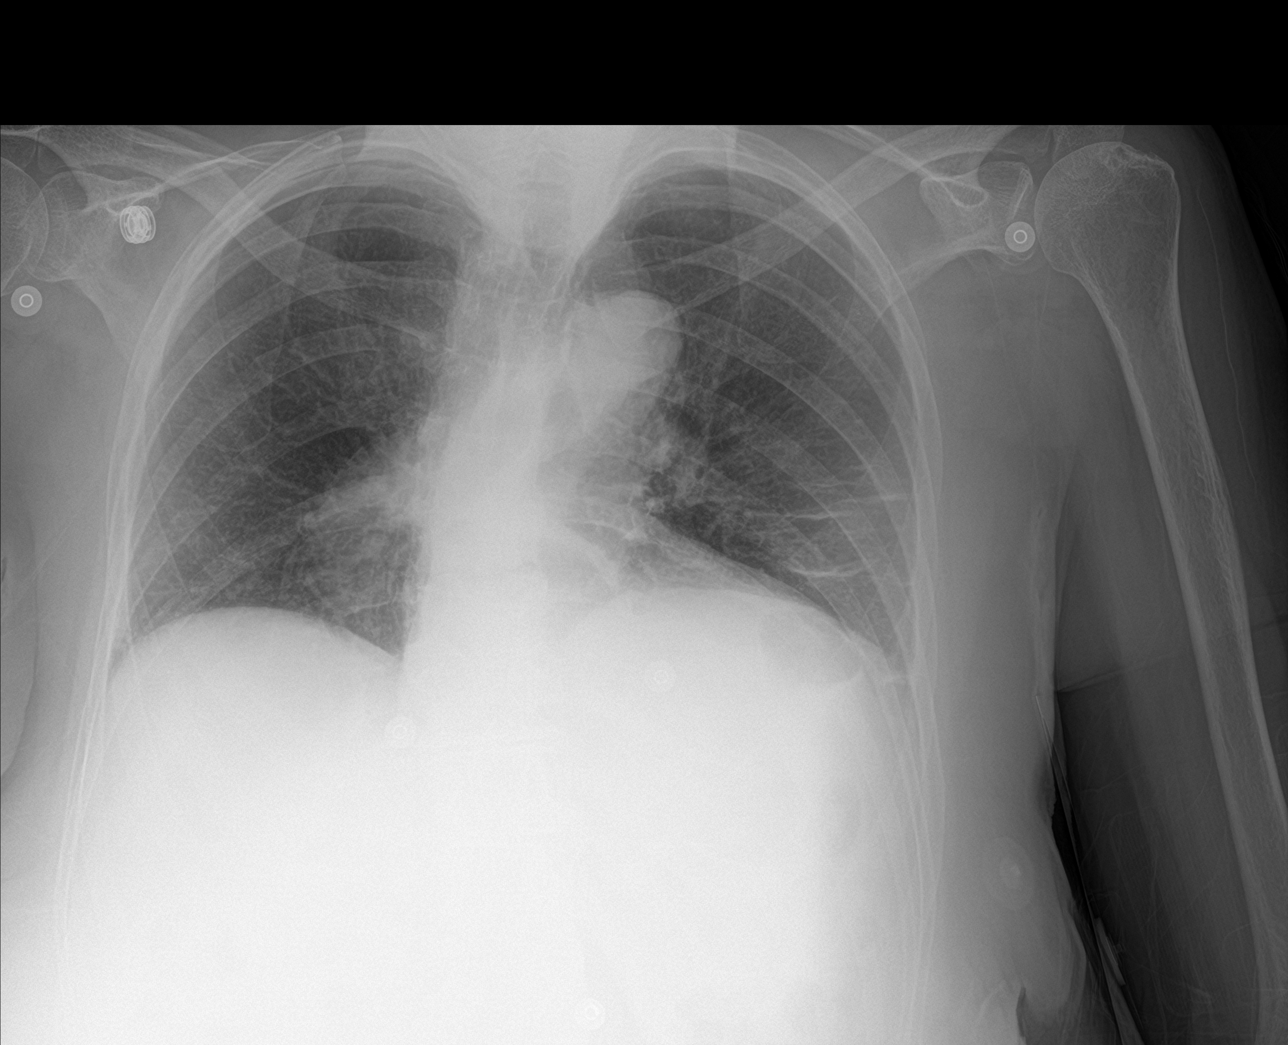

[3 of 3 positions shown; findings below may reference images not displayed]

FINDINGS: Lingular scarring or atelectasis. Mild cardiomegaly. Low lung
volumes. No focal opacity on the right. No effusions or acute bony
abnormality.
IMPRESSION: Lingular scarring or atelectasis.  Low lung volumes.

## 2017-03-06 IMAGING — MR MR HEAD W/O CM
8 of 10 series · 36 of 48 positions shown · non-contrast
Comparison: 11/06/2016 CT head.  12/20/2015 MRI head.

CLINICAL DATA: 75 y/o F; history of dementia with progressive
encephalopathy.

EXAM:
MRI HEAD WITHOUT CONTRAST
TECHNIQUE: Multiplanar, multiecho pulse sequences of the brain and surrounding
structures were obtained without intravenous contrast.

[Series 3: T1 · sagittal · 5.0mm · 0.47mm/px · 2 of 27 slices shown]
[im 1/27]
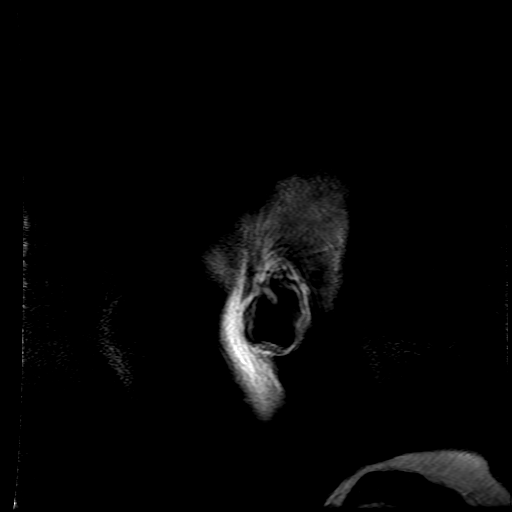
[im 14/27]
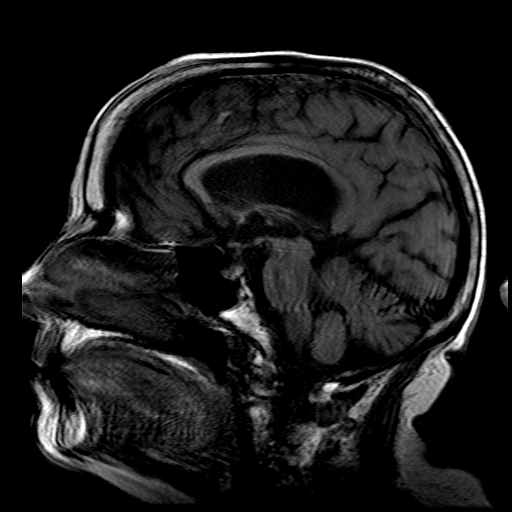

[Series 4: DWI · axial · 4.0mm · 1.17mm/px · z∈[-29,+128]mm · 7 of 72 slices shown (1 of 4)]
[im 1/72]
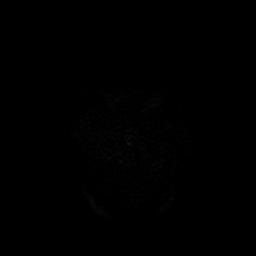
[im 12/72]
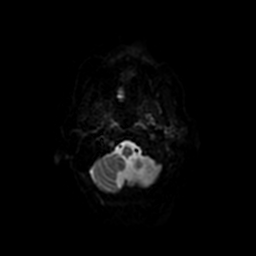
[im 24/72]
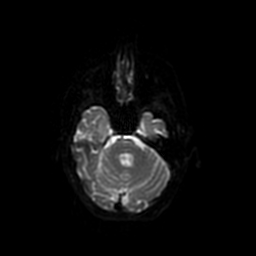
[im 36/72]
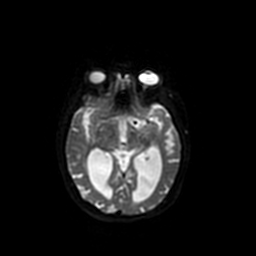
[im 48/72]
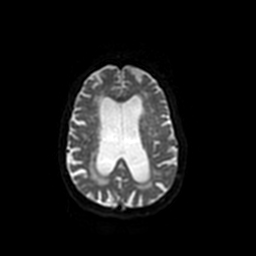
[im 60/72]
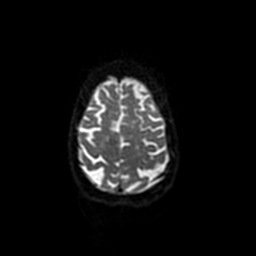
[im 72/72]
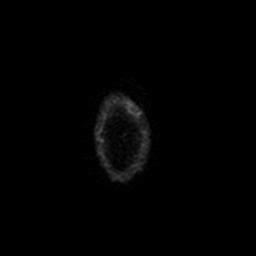

[Series 5: DWI · coronal · 4.0mm · 1.17mm/px · 8 of 98 slices shown (2 of 4)]
[im 1/98]
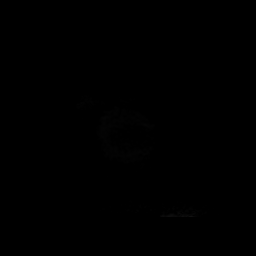
[im 11/98]
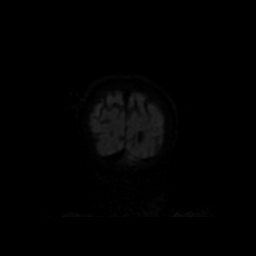
[im 33/98]
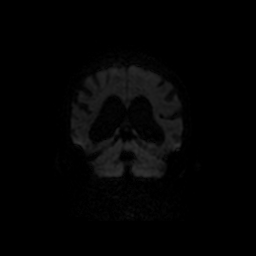
[im 44/98]
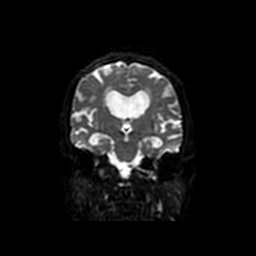
[im 54/98]
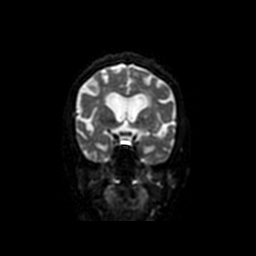
[im 65/98]
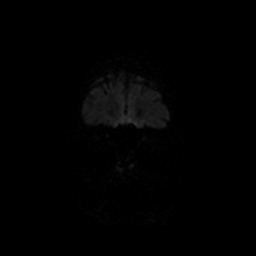
[im 87/98]
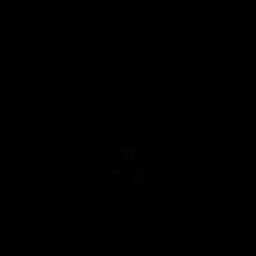
[im 98/98]
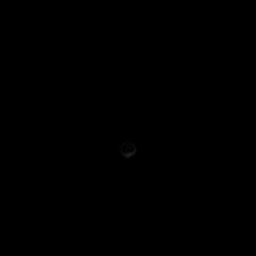

[Series 6: T2 · axial · 5.0mm · 0.43mm/px · z∈[-37,+125]mm · 3 of 28 slices shown]
[im 1/28]
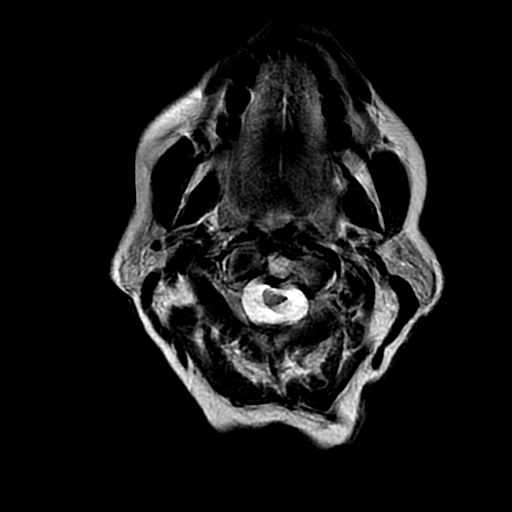
[im 14/28]
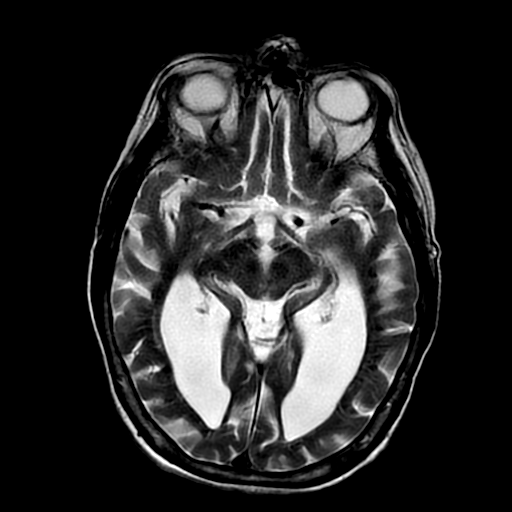
[im 28/28]
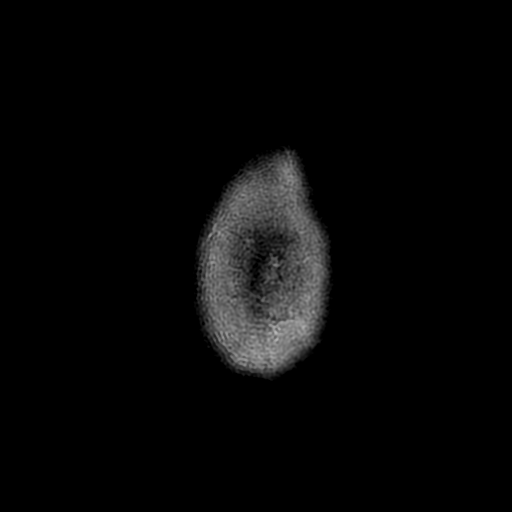

[Series 7: FLAIR · axial · 5.0mm · 0.43mm/px · z∈[-37,+125]mm · 3 of 28 slices shown]
[im 1/28]
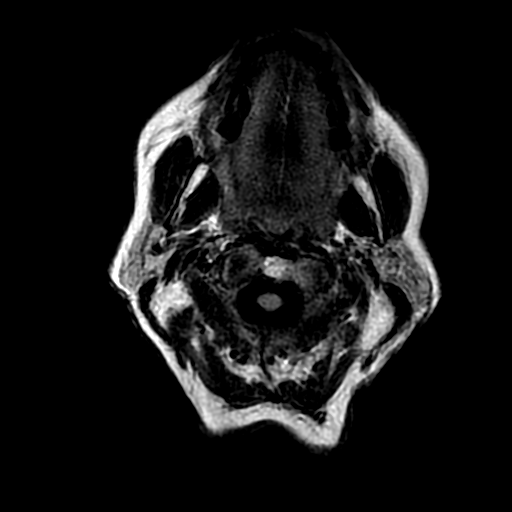
[im 14/28]
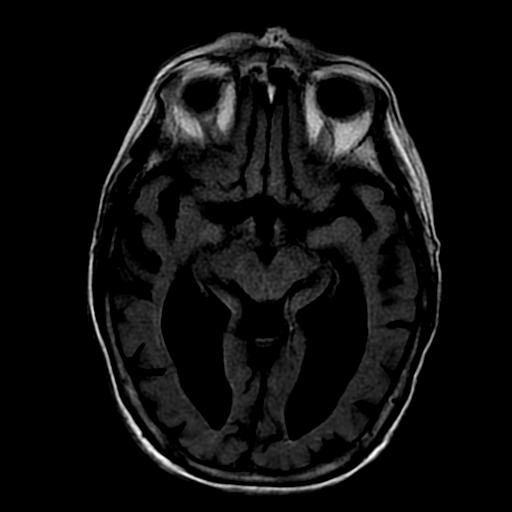
[im 28/28]
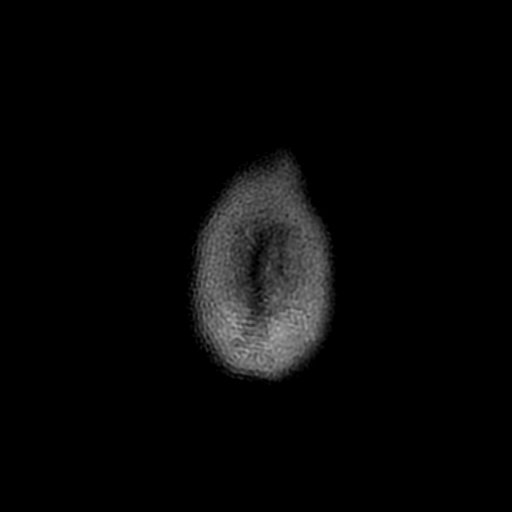

[Series 10: T2 post-contrast · coronal · 5.0mm · 0.45mm/px · 4 of 35 slices shown]
[im 1/35]
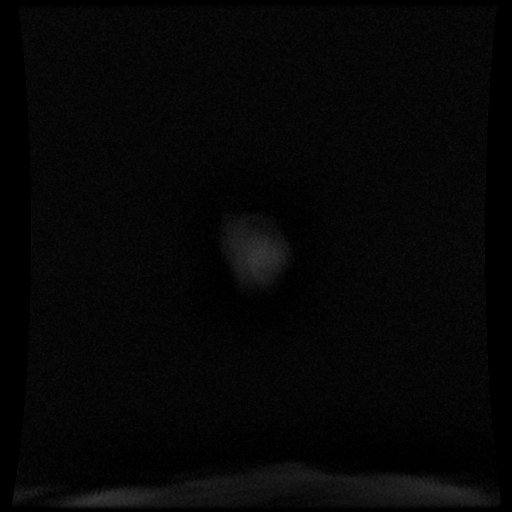
[im 12/35]
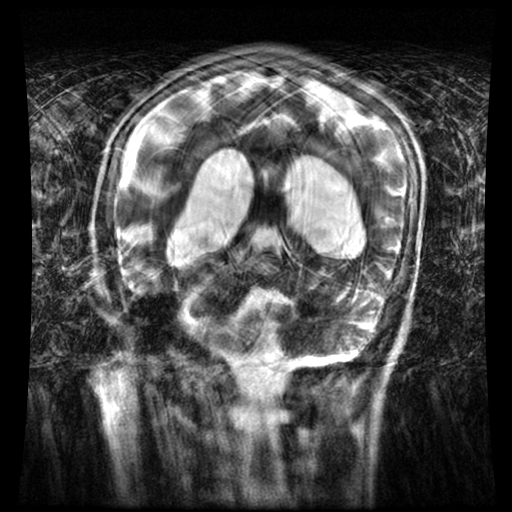
[im 23/35]
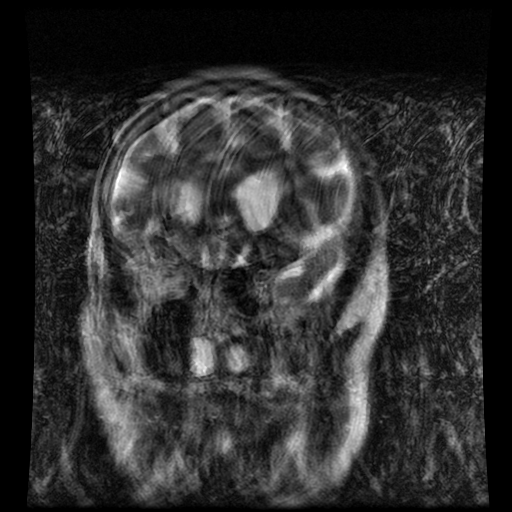
[im 35/35]
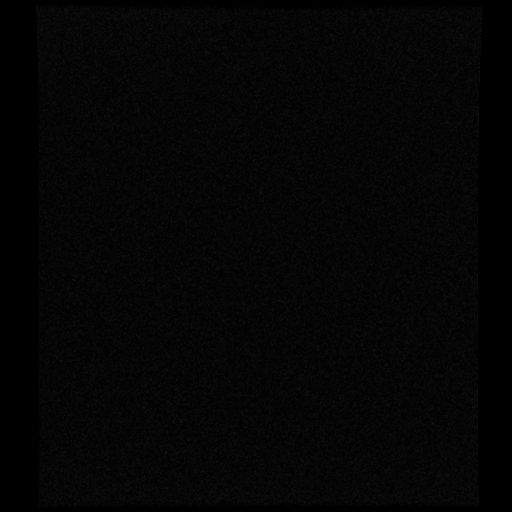

[Series 400: DWI · axial · 4.0mm · 1.17mm/px · z∈[-29,+128]mm · 4 of 36 slices shown (3 of 4)]
[im 1/36]
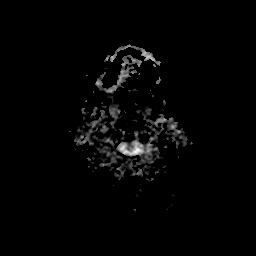
[im 12/36]
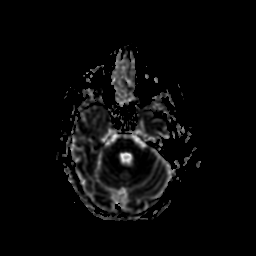
[im 24/36]
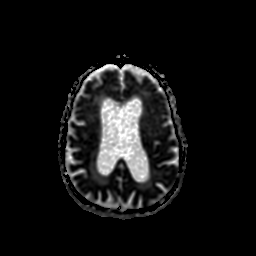
[im 36/36]
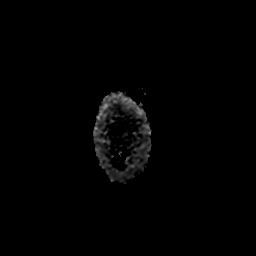

[Series 500: DWI · coronal · 4.0mm · 1.17mm/px · 5 of 49 slices shown (4 of 4)]
[im 1/49]
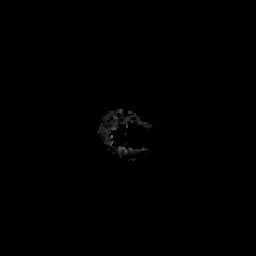
[im 13/49]
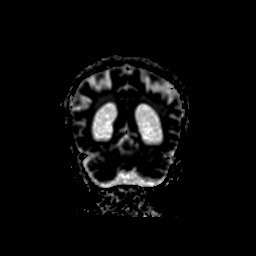
[im 25/49]
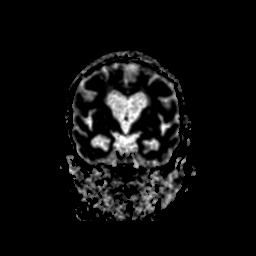
[im 37/49]
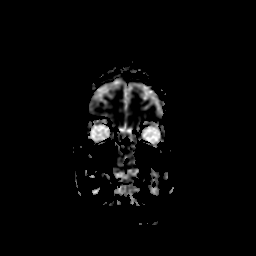
[im 49/49]
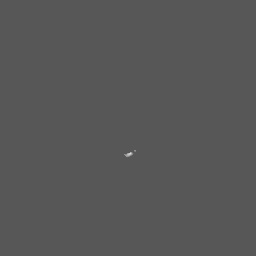

[36 of 48 positions shown; findings below may reference images not displayed]

FINDINGS: Brain: No acute infarction, hemorrhage, hydrocephalus, extra-axial
collection or mass lesion. Stable advanced parenchymal volume loss
of the brain. Nonspecific foci of T2 FLAIR hyperintensity in
subcortical and periventricular white matter are consistent with
moderate chronic microvascular ischemic changes and mildly
progressed from prior MRI.

Vascular: Normal flow voids.

Skull and upper cervical spine: Left frontal bone osteoma as seen on
prior CT. No abnormal bone marrow signal.

Sinuses/Orbits: Negative.

Other: None.
IMPRESSION: 1. No acute intracranial abnormality.
2. Stable advanced parenchymal volume loss of the brain.
3. Moderate chronic microvascular ischemic changes, mildly
progressed from prior MRI.

By: Rubenia Hennings M.D.

## 2017-03-12 DEATH — deceased
